# Patient Record
Sex: Female | Born: 1935
Health system: Southern US, Community
[De-identification: ages and names within clinical notes are randomized; demographics above are authoritative.]

## PROBLEM LIST (undated history)

## (undated) DIAGNOSIS — I10 Essential (primary) hypertension: Secondary | ICD-10-CM

## (undated) DIAGNOSIS — T8859XA Other complications of anesthesia, initial encounter: Secondary | ICD-10-CM

## (undated) DIAGNOSIS — T4145XA Adverse effect of unspecified anesthetic, initial encounter: Secondary | ICD-10-CM

---
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
## 1997-07-23 ENCOUNTER — Ambulatory Visit: Admission: RE | Admit: 1997-07-23 | Discharge: 1997-07-23 | Payer: Self-pay | Admitting: Internal Medicine

## 1999-08-21 ENCOUNTER — Encounter: Payer: Self-pay | Admitting: Internal Medicine

## 1999-08-21 ENCOUNTER — Encounter: Admission: RE | Admit: 1999-08-21 | Discharge: 1999-08-21 | Payer: Self-pay | Admitting: Internal Medicine

## 2000-07-16 ENCOUNTER — Encounter: Admission: RE | Admit: 2000-07-16 | Discharge: 2000-07-16 | Payer: Self-pay | Admitting: Internal Medicine

## 2000-07-16 ENCOUNTER — Encounter: Payer: Self-pay | Admitting: Internal Medicine

## 2000-08-22 ENCOUNTER — Encounter: Admission: RE | Admit: 2000-08-22 | Discharge: 2000-08-22 | Payer: Self-pay | Admitting: Internal Medicine

## 2000-08-22 ENCOUNTER — Encounter: Payer: Self-pay | Admitting: Internal Medicine

## 2001-08-25 ENCOUNTER — Encounter: Payer: Self-pay | Admitting: Internal Medicine

## 2001-08-25 ENCOUNTER — Encounter: Admission: RE | Admit: 2001-08-25 | Discharge: 2001-08-25 | Payer: Self-pay | Admitting: Internal Medicine

## 2002-08-18 ENCOUNTER — Encounter: Admission: RE | Admit: 2002-08-18 | Discharge: 2002-08-18 | Payer: Self-pay | Admitting: Internal Medicine

## 2002-08-18 ENCOUNTER — Encounter: Payer: Self-pay | Admitting: Internal Medicine

## 2002-09-10 ENCOUNTER — Ambulatory Visit (HOSPITAL_COMMUNITY): Admission: RE | Admit: 2002-09-10 | Discharge: 2002-09-10 | Payer: Self-pay | Admitting: Gastroenterology

## 2004-08-21 ENCOUNTER — Encounter: Admission: RE | Admit: 2004-08-21 | Discharge: 2004-08-21 | Payer: Self-pay | Admitting: Internal Medicine

## 2006-10-03 ENCOUNTER — Encounter: Admission: RE | Admit: 2006-10-03 | Discharge: 2006-10-03 | Payer: Self-pay | Admitting: Internal Medicine

## 2008-10-21 ENCOUNTER — Encounter: Admission: RE | Admit: 2008-10-21 | Discharge: 2008-10-21 | Payer: Self-pay | Admitting: Internal Medicine

## 2010-01-12 ENCOUNTER — Encounter: Admission: RE | Admit: 2010-01-12 | Discharge: 2010-01-12 | Payer: Self-pay | Admitting: Internal Medicine

## 2010-08-04 NOTE — Op Note (Signed)
   NAME:  Tammy Dillon, Tammy Dillon                          ACCOUNT NO.:  [id number]   MEDICAL RECORD NO.:  [id number]                   PATIENT TYPE:  AMB   LOCATION:  ENDO                                 FACILITY:  [facility]   PHYSICIAN:  Danise Edge, M.D.                DATE OF BIRTH:  1930-02-04   DATE OF PROCEDURE:  09/10/2002  DATE OF DISCHARGE:                                 OPERATIVE REPORT   PROCEDURE:  Screening colonoscopy.   INDICATIONS FOR PROCEDURE:  Ms. [name] is a 77 year old female born  August 22, 1929.  Ms. [name] is scheduled to undergo her first screening  colonoscopy with polypectomy to prevent colon cancer.   ENDOSCOPIST:  Charolett Bumpers, M.D.   PREMEDICATION:  Versed 7.5 mg, Demerol 30 mg.   PROCEDURE:  After obtaining confirmed consent, Ms. [name] was placed in the  left lateral decubitus position.  I administered intravenous Demerol and  intravenous  Versed to achieve conscious sedation for the procedure.  The  patient's blood pressure, oxygen saturation, and cardiac rhythm were  monitored throughout the procedure and documented in the medical record.  Anal inspection was normal.  Digital rectal exam was normal.  The Olympus  adjustable pediatric video colonoscope was introduced into the rectum and  advanced to the cecum.  Colonic preparation for the exam today was  excellent.   Rectum normal.  Sigmoid colon and descending colon:  Left colonic diverticulosis.  Splenic flexure normal.  Transverse colon normal.  Hepatic flexure normal.  Ascending colon normal.  Cecum and ileocecal valve normal.   ASSESSMENT:  Normal screening proctocolonoscopy to the cecum.  No endoscopic  evidence for the presence of colorectal neoplasia.  There was colonic  diverticulosis without diverticulitis or diverticular stricture formation  noted.                                               Danise Edge, M.D.    MJ/MEDQ  D:  09/10/2002  T:  09/11/2002  Job:   [id number]

## 2011-01-03 ENCOUNTER — Other Ambulatory Visit: Payer: Self-pay | Admitting: Internal Medicine

## 2011-01-03 DIAGNOSIS — Z1231 Encounter for screening mammogram for malignant neoplasm of breast: Secondary | ICD-10-CM

## 2011-01-25 ENCOUNTER — Ambulatory Visit
Admission: RE | Admit: 2011-01-25 | Discharge: 2011-01-25 | Disposition: A | Discharge status: Home / Self Care | Payer: Medicare Other | Source: Ambulatory Visit | Attending: Internal Medicine | Admitting: Internal Medicine

## 2011-01-25 DIAGNOSIS — Z1231 Encounter for screening mammogram for malignant neoplasm of breast: Secondary | ICD-10-CM

## 2011-07-02 DIAGNOSIS — Z961 Presence of intraocular lens: Secondary | ICD-10-CM | POA: Diagnosis not present

## 2011-07-02 DIAGNOSIS — H2510 Age-related nuclear cataract, unspecified eye: Secondary | ICD-10-CM | POA: Diagnosis not present

## 2011-11-15 DIAGNOSIS — E782 Mixed hyperlipidemia: Secondary | ICD-10-CM | POA: Diagnosis not present

## 2011-11-15 DIAGNOSIS — E559 Vitamin D deficiency, unspecified: Secondary | ICD-10-CM | POA: Diagnosis not present

## 2011-11-15 DIAGNOSIS — I10 Essential (primary) hypertension: Secondary | ICD-10-CM | POA: Diagnosis not present

## 2011-11-15 DIAGNOSIS — G4762 Sleep related leg cramps: Secondary | ICD-10-CM | POA: Diagnosis not present

## 2011-11-15 DIAGNOSIS — Z79899 Other long term (current) drug therapy: Secondary | ICD-10-CM | POA: Diagnosis not present

## 2011-11-15 DIAGNOSIS — Z1331 Encounter for screening for depression: Secondary | ICD-10-CM | POA: Diagnosis not present

## 2011-11-15 DIAGNOSIS — N390 Urinary tract infection, site not specified: Secondary | ICD-10-CM | POA: Diagnosis not present

## 2011-11-15 DIAGNOSIS — M810 Age-related osteoporosis without current pathological fracture: Secondary | ICD-10-CM | POA: Diagnosis not present

## 2011-11-15 DIAGNOSIS — R5382 Chronic fatigue, unspecified: Secondary | ICD-10-CM | POA: Diagnosis not present

## 2011-11-15 DIAGNOSIS — Z0000 Encounter for general adult medical examination without abnormal findings: Secondary | ICD-10-CM | POA: Diagnosis not present

## 2011-11-20 DIAGNOSIS — L821 Other seborrheic keratosis: Secondary | ICD-10-CM | POA: Diagnosis not present

## 2011-11-20 DIAGNOSIS — L570 Actinic keratosis: Secondary | ICD-10-CM | POA: Diagnosis not present

## 2011-11-20 DIAGNOSIS — Z85828 Personal history of other malignant neoplasm of skin: Secondary | ICD-10-CM | POA: Diagnosis not present

## 2011-12-04 DIAGNOSIS — N390 Urinary tract infection, site not specified: Secondary | ICD-10-CM | POA: Diagnosis not present

## 2011-12-13 DIAGNOSIS — Z23 Encounter for immunization: Secondary | ICD-10-CM | POA: Diagnosis not present

## 2012-01-22 ENCOUNTER — Other Ambulatory Visit: Payer: Self-pay | Admitting: Internal Medicine

## 2012-01-22 DIAGNOSIS — E7800 Pure hypercholesterolemia, unspecified: Secondary | ICD-10-CM | POA: Diagnosis not present

## 2012-01-22 DIAGNOSIS — Z1231 Encounter for screening mammogram for malignant neoplasm of breast: Secondary | ICD-10-CM

## 2012-02-28 ENCOUNTER — Ambulatory Visit
Admission: RE | Admit: 2012-02-28 | Discharge: 2012-02-28 | Disposition: A | Discharge status: Home / Self Care | Payer: Medicare Other | Source: Ambulatory Visit | Attending: Internal Medicine | Admitting: Internal Medicine

## 2012-02-28 DIAGNOSIS — Z1231 Encounter for screening mammogram for malignant neoplasm of breast: Secondary | ICD-10-CM | POA: Diagnosis not present

## 2012-09-11 DIAGNOSIS — L719 Rosacea, unspecified: Secondary | ICD-10-CM | POA: Diagnosis not present

## 2012-09-11 DIAGNOSIS — D485 Neoplasm of uncertain behavior of skin: Secondary | ICD-10-CM | POA: Diagnosis not present

## 2012-09-11 DIAGNOSIS — D0439 Carcinoma in situ of skin of other parts of face: Secondary | ICD-10-CM | POA: Diagnosis not present

## 2012-09-11 DIAGNOSIS — L570 Actinic keratosis: Secondary | ICD-10-CM | POA: Diagnosis not present

## 2012-09-11 DIAGNOSIS — Z85828 Personal history of other malignant neoplasm of skin: Secondary | ICD-10-CM | POA: Diagnosis not present

## 2012-10-08 DIAGNOSIS — Z85828 Personal history of other malignant neoplasm of skin: Secondary | ICD-10-CM | POA: Diagnosis not present

## 2012-10-08 DIAGNOSIS — D0439 Carcinoma in situ of skin of other parts of face: Secondary | ICD-10-CM | POA: Diagnosis not present

## 2012-10-30 DIAGNOSIS — Z961 Presence of intraocular lens: Secondary | ICD-10-CM | POA: Diagnosis not present

## 2012-10-30 DIAGNOSIS — H2510 Age-related nuclear cataract, unspecified eye: Secondary | ICD-10-CM | POA: Diagnosis not present

## 2012-11-18 DIAGNOSIS — E782 Mixed hyperlipidemia: Secondary | ICD-10-CM | POA: Diagnosis not present

## 2012-11-18 DIAGNOSIS — G4762 Sleep related leg cramps: Secondary | ICD-10-CM | POA: Diagnosis not present

## 2012-11-18 DIAGNOSIS — I10 Essential (primary) hypertension: Secondary | ICD-10-CM | POA: Diagnosis not present

## 2012-11-18 DIAGNOSIS — E559 Vitamin D deficiency, unspecified: Secondary | ICD-10-CM | POA: Diagnosis not present

## 2012-11-18 DIAGNOSIS — R350 Frequency of micturition: Secondary | ICD-10-CM | POA: Diagnosis not present

## 2012-11-18 DIAGNOSIS — Z1331 Encounter for screening for depression: Secondary | ICD-10-CM | POA: Diagnosis not present

## 2012-11-18 DIAGNOSIS — Z79899 Other long term (current) drug therapy: Secondary | ICD-10-CM | POA: Diagnosis not present

## 2012-11-18 DIAGNOSIS — Z0000 Encounter for general adult medical examination without abnormal findings: Secondary | ICD-10-CM | POA: Diagnosis not present

## 2012-11-18 DIAGNOSIS — M810 Age-related osteoporosis without current pathological fracture: Secondary | ICD-10-CM | POA: Diagnosis not present

## 2012-11-21 DIAGNOSIS — Z1211 Encounter for screening for malignant neoplasm of colon: Secondary | ICD-10-CM | POA: Diagnosis not present

## 2012-12-04 DIAGNOSIS — L719 Rosacea, unspecified: Secondary | ICD-10-CM | POA: Diagnosis not present

## 2012-12-04 DIAGNOSIS — L570 Actinic keratosis: Secondary | ICD-10-CM | POA: Diagnosis not present

## 2012-12-04 DIAGNOSIS — D239 Other benign neoplasm of skin, unspecified: Secondary | ICD-10-CM | POA: Diagnosis not present

## 2012-12-04 DIAGNOSIS — L821 Other seborrheic keratosis: Secondary | ICD-10-CM | POA: Diagnosis not present

## 2012-12-04 DIAGNOSIS — Z85828 Personal history of other malignant neoplasm of skin: Secondary | ICD-10-CM | POA: Diagnosis not present

## 2013-05-20 DIAGNOSIS — Z79899 Other long term (current) drug therapy: Secondary | ICD-10-CM | POA: Diagnosis not present

## 2013-05-20 DIAGNOSIS — I10 Essential (primary) hypertension: Secondary | ICD-10-CM | POA: Diagnosis not present

## 2013-05-20 DIAGNOSIS — R350 Frequency of micturition: Secondary | ICD-10-CM | POA: Diagnosis not present

## 2013-05-20 DIAGNOSIS — R5382 Chronic fatigue, unspecified: Secondary | ICD-10-CM | POA: Diagnosis not present

## 2013-05-20 DIAGNOSIS — E782 Mixed hyperlipidemia: Secondary | ICD-10-CM | POA: Diagnosis not present

## 2013-05-20 DIAGNOSIS — E559 Vitamin D deficiency, unspecified: Secondary | ICD-10-CM | POA: Diagnosis not present

## 2013-05-20 DIAGNOSIS — G4762 Sleep related leg cramps: Secondary | ICD-10-CM | POA: Diagnosis not present

## 2013-05-20 DIAGNOSIS — G9332 Myalgic encephalomyelitis/chronic fatigue syndrome: Secondary | ICD-10-CM | POA: Diagnosis not present

## 2013-05-20 DIAGNOSIS — M810 Age-related osteoporosis without current pathological fracture: Secondary | ICD-10-CM | POA: Diagnosis not present

## 2013-05-28 DIAGNOSIS — Z85828 Personal history of other malignant neoplasm of skin: Secondary | ICD-10-CM | POA: Diagnosis not present

## 2013-05-28 DIAGNOSIS — D485 Neoplasm of uncertain behavior of skin: Secondary | ICD-10-CM | POA: Diagnosis not present

## 2013-07-02 ENCOUNTER — Other Ambulatory Visit: Payer: Self-pay | Admitting: Internal Medicine

## 2013-07-02 DIAGNOSIS — Z1231 Encounter for screening mammogram for malignant neoplasm of breast: Secondary | ICD-10-CM

## 2013-07-23 ENCOUNTER — Ambulatory Visit
Admission: RE | Admit: 2013-07-23 | Discharge: 2013-07-23 | Disposition: A | Discharge status: Home / Self Care | Payer: Medicare Other | Source: Ambulatory Visit | Attending: Internal Medicine | Admitting: Internal Medicine

## 2013-07-23 ENCOUNTER — Encounter (INDEPENDENT_AMBULATORY_CARE_PROVIDER_SITE_OTHER): Payer: Self-pay

## 2013-07-23 DIAGNOSIS — Z1231 Encounter for screening mammogram for malignant neoplasm of breast: Secondary | ICD-10-CM

## 2013-11-19 DIAGNOSIS — E559 Vitamin D deficiency, unspecified: Secondary | ICD-10-CM | POA: Diagnosis not present

## 2013-11-19 DIAGNOSIS — Z23 Encounter for immunization: Secondary | ICD-10-CM | POA: Diagnosis not present

## 2013-11-19 DIAGNOSIS — R5381 Other malaise: Secondary | ICD-10-CM | POA: Diagnosis not present

## 2013-11-19 DIAGNOSIS — I10 Essential (primary) hypertension: Secondary | ICD-10-CM | POA: Diagnosis not present

## 2013-11-19 DIAGNOSIS — R350 Frequency of micturition: Secondary | ICD-10-CM | POA: Diagnosis not present

## 2013-11-19 DIAGNOSIS — R5383 Other fatigue: Secondary | ICD-10-CM | POA: Diagnosis not present

## 2013-11-19 DIAGNOSIS — Z79899 Other long term (current) drug therapy: Secondary | ICD-10-CM | POA: Diagnosis not present

## 2013-11-19 DIAGNOSIS — E782 Mixed hyperlipidemia: Secondary | ICD-10-CM | POA: Diagnosis not present

## 2013-11-19 DIAGNOSIS — M810 Age-related osteoporosis without current pathological fracture: Secondary | ICD-10-CM | POA: Diagnosis not present

## 2013-11-19 DIAGNOSIS — Z0000 Encounter for general adult medical examination without abnormal findings: Secondary | ICD-10-CM | POA: Diagnosis not present

## 2013-11-19 DIAGNOSIS — Z1331 Encounter for screening for depression: Secondary | ICD-10-CM | POA: Diagnosis not present

## 2013-12-09 DIAGNOSIS — Z23 Encounter for immunization: Secondary | ICD-10-CM | POA: Diagnosis not present

## 2013-12-10 DIAGNOSIS — L821 Other seborrheic keratosis: Secondary | ICD-10-CM | POA: Diagnosis not present

## 2013-12-10 DIAGNOSIS — L570 Actinic keratosis: Secondary | ICD-10-CM | POA: Diagnosis not present

## 2013-12-10 DIAGNOSIS — D1801 Hemangioma of skin and subcutaneous tissue: Secondary | ICD-10-CM | POA: Diagnosis not present

## 2013-12-10 DIAGNOSIS — Z85828 Personal history of other malignant neoplasm of skin: Secondary | ICD-10-CM | POA: Diagnosis not present

## 2014-02-04 DIAGNOSIS — H2512 Age-related nuclear cataract, left eye: Secondary | ICD-10-CM | POA: Diagnosis not present

## 2014-02-04 DIAGNOSIS — Z961 Presence of intraocular lens: Secondary | ICD-10-CM | POA: Diagnosis not present

## 2014-02-10 DIAGNOSIS — H2512 Age-related nuclear cataract, left eye: Secondary | ICD-10-CM | POA: Diagnosis not present

## 2014-02-16 DIAGNOSIS — Z85828 Personal history of other malignant neoplasm of skin: Secondary | ICD-10-CM | POA: Diagnosis not present

## 2014-02-16 DIAGNOSIS — L718 Other rosacea: Secondary | ICD-10-CM | POA: Diagnosis not present

## 2014-02-17 DIAGNOSIS — H2512 Age-related nuclear cataract, left eye: Secondary | ICD-10-CM | POA: Diagnosis not present

## 2014-05-19 DIAGNOSIS — Z79899 Other long term (current) drug therapy: Secondary | ICD-10-CM | POA: Diagnosis not present

## 2014-05-19 DIAGNOSIS — R300 Dysuria: Secondary | ICD-10-CM | POA: Diagnosis not present

## 2014-05-19 DIAGNOSIS — R7309 Other abnormal glucose: Secondary | ICD-10-CM | POA: Diagnosis not present

## 2014-05-19 DIAGNOSIS — J3489 Other specified disorders of nose and nasal sinuses: Secondary | ICD-10-CM | POA: Diagnosis not present

## 2014-05-19 DIAGNOSIS — I10 Essential (primary) hypertension: Secondary | ICD-10-CM | POA: Diagnosis not present

## 2014-05-19 DIAGNOSIS — E780 Pure hypercholesterolemia: Secondary | ICD-10-CM | POA: Diagnosis not present

## 2014-05-19 DIAGNOSIS — E559 Vitamin D deficiency, unspecified: Secondary | ICD-10-CM | POA: Diagnosis not present

## 2014-06-21 DIAGNOSIS — Z85828 Personal history of other malignant neoplasm of skin: Secondary | ICD-10-CM | POA: Diagnosis not present

## 2014-06-21 DIAGNOSIS — D1801 Hemangioma of skin and subcutaneous tissue: Secondary | ICD-10-CM | POA: Diagnosis not present

## 2014-06-21 DIAGNOSIS — D225 Melanocytic nevi of trunk: Secondary | ICD-10-CM | POA: Diagnosis not present

## 2014-06-21 DIAGNOSIS — L570 Actinic keratosis: Secondary | ICD-10-CM | POA: Diagnosis not present

## 2014-06-21 DIAGNOSIS — L821 Other seborrheic keratosis: Secondary | ICD-10-CM | POA: Diagnosis not present

## 2014-06-24 DIAGNOSIS — Z961 Presence of intraocular lens: Secondary | ICD-10-CM | POA: Diagnosis not present

## 2014-12-16 ENCOUNTER — Other Ambulatory Visit: Payer: Self-pay | Admitting: Internal Medicine

## 2014-12-16 DIAGNOSIS — Z23 Encounter for immunization: Secondary | ICD-10-CM | POA: Diagnosis not present

## 2014-12-16 DIAGNOSIS — R252 Cramp and spasm: Secondary | ICD-10-CM | POA: Diagnosis not present

## 2014-12-16 DIAGNOSIS — E559 Vitamin D deficiency, unspecified: Secondary | ICD-10-CM | POA: Diagnosis not present

## 2014-12-16 DIAGNOSIS — R0989 Other specified symptoms and signs involving the circulatory and respiratory systems: Secondary | ICD-10-CM

## 2014-12-16 DIAGNOSIS — I10 Essential (primary) hypertension: Secondary | ICD-10-CM | POA: Diagnosis not present

## 2014-12-16 DIAGNOSIS — J309 Allergic rhinitis, unspecified: Secondary | ICD-10-CM | POA: Diagnosis not present

## 2014-12-16 DIAGNOSIS — R7309 Other abnormal glucose: Secondary | ICD-10-CM | POA: Diagnosis not present

## 2014-12-16 DIAGNOSIS — Z1389 Encounter for screening for other disorder: Secondary | ICD-10-CM | POA: Diagnosis not present

## 2014-12-16 DIAGNOSIS — Z0001 Encounter for general adult medical examination with abnormal findings: Secondary | ICD-10-CM | POA: Diagnosis not present

## 2014-12-16 DIAGNOSIS — M810 Age-related osteoporosis without current pathological fracture: Secondary | ICD-10-CM | POA: Diagnosis not present

## 2014-12-16 DIAGNOSIS — E782 Mixed hyperlipidemia: Secondary | ICD-10-CM | POA: Diagnosis not present

## 2014-12-16 DIAGNOSIS — Z79899 Other long term (current) drug therapy: Secondary | ICD-10-CM | POA: Diagnosis not present

## 2014-12-20 ENCOUNTER — Other Ambulatory Visit: Payer: No Typology Code available for payment source

## 2014-12-20 ENCOUNTER — Ambulatory Visit
Admission: RE | Admit: 2014-12-20 | Discharge: 2014-12-20 | Disposition: A | Discharge status: Home / Self Care | Payer: Medicare Other | Source: Ambulatory Visit | Attending: Internal Medicine | Admitting: Internal Medicine

## 2014-12-20 DIAGNOSIS — I6523 Occlusion and stenosis of bilateral carotid arteries: Secondary | ICD-10-CM | POA: Diagnosis not present

## 2014-12-20 DIAGNOSIS — R0989 Other specified symptoms and signs involving the circulatory and respiratory systems: Secondary | ICD-10-CM

## 2015-06-21 DIAGNOSIS — D1801 Hemangioma of skin and subcutaneous tissue: Secondary | ICD-10-CM | POA: Diagnosis not present

## 2015-06-21 DIAGNOSIS — Z85828 Personal history of other malignant neoplasm of skin: Secondary | ICD-10-CM | POA: Diagnosis not present

## 2015-06-21 DIAGNOSIS — L570 Actinic keratosis: Secondary | ICD-10-CM | POA: Diagnosis not present

## 2015-06-21 DIAGNOSIS — L821 Other seborrheic keratosis: Secondary | ICD-10-CM | POA: Diagnosis not present

## 2016-01-06 DIAGNOSIS — J309 Allergic rhinitis, unspecified: Secondary | ICD-10-CM | POA: Diagnosis not present

## 2016-01-06 DIAGNOSIS — R252 Cramp and spasm: Secondary | ICD-10-CM | POA: Diagnosis not present

## 2016-01-06 DIAGNOSIS — Z1389 Encounter for screening for other disorder: Secondary | ICD-10-CM | POA: Diagnosis not present

## 2016-01-06 DIAGNOSIS — E782 Mixed hyperlipidemia: Secondary | ICD-10-CM | POA: Diagnosis not present

## 2016-01-06 DIAGNOSIS — R0989 Other specified symptoms and signs involving the circulatory and respiratory systems: Secondary | ICD-10-CM | POA: Diagnosis not present

## 2016-01-06 DIAGNOSIS — E559 Vitamin D deficiency, unspecified: Secondary | ICD-10-CM | POA: Diagnosis not present

## 2016-01-06 DIAGNOSIS — J9801 Acute bronchospasm: Secondary | ICD-10-CM | POA: Diagnosis not present

## 2016-01-06 DIAGNOSIS — Z23 Encounter for immunization: Secondary | ICD-10-CM | POA: Diagnosis not present

## 2016-01-06 DIAGNOSIS — I10 Essential (primary) hypertension: Secondary | ICD-10-CM | POA: Diagnosis not present

## 2016-01-06 DIAGNOSIS — M810 Age-related osteoporosis without current pathological fracture: Secondary | ICD-10-CM | POA: Diagnosis not present

## 2016-01-06 DIAGNOSIS — Z0001 Encounter for general adult medical examination with abnormal findings: Secondary | ICD-10-CM | POA: Diagnosis not present

## 2016-01-06 DIAGNOSIS — Z79899 Other long term (current) drug therapy: Secondary | ICD-10-CM | POA: Diagnosis not present

## 2016-01-06 DIAGNOSIS — R7303 Prediabetes: Secondary | ICD-10-CM | POA: Diagnosis not present

## 2016-01-20 DIAGNOSIS — N390 Urinary tract infection, site not specified: Secondary | ICD-10-CM | POA: Diagnosis not present

## 2016-06-26 DIAGNOSIS — D692 Other nonthrombocytopenic purpura: Secondary | ICD-10-CM | POA: Diagnosis not present

## 2016-06-26 DIAGNOSIS — L570 Actinic keratosis: Secondary | ICD-10-CM | POA: Diagnosis not present

## 2016-06-26 DIAGNOSIS — L821 Other seborrheic keratosis: Secondary | ICD-10-CM | POA: Diagnosis not present

## 2016-06-26 DIAGNOSIS — D225 Melanocytic nevi of trunk: Secondary | ICD-10-CM | POA: Diagnosis not present

## 2016-06-26 DIAGNOSIS — Z85828 Personal history of other malignant neoplasm of skin: Secondary | ICD-10-CM | POA: Diagnosis not present

## 2016-09-13 DIAGNOSIS — H26492 Other secondary cataract, left eye: Secondary | ICD-10-CM | POA: Diagnosis not present

## 2016-09-13 DIAGNOSIS — Z961 Presence of intraocular lens: Secondary | ICD-10-CM | POA: Diagnosis not present

## 2016-09-16 IMAGING — US US CAROTID DUPLEX BILAT
1 series · 13 of 24 positions shown · non-contrast
Comparison: None.

CLINICAL DATA: Left carotid bruit.

EXAM:
BILATERAL CAROTID DUPLEX ULTRASOUND
TECHNIQUE: Gray scale imaging, color Doppler and duplex ultrasound were
performed of bilateral carotid and vertebral arteries in the neck.

[Series 1: us carotid duplex bilat · 13 of 27 slices shown]
[im 1/27]
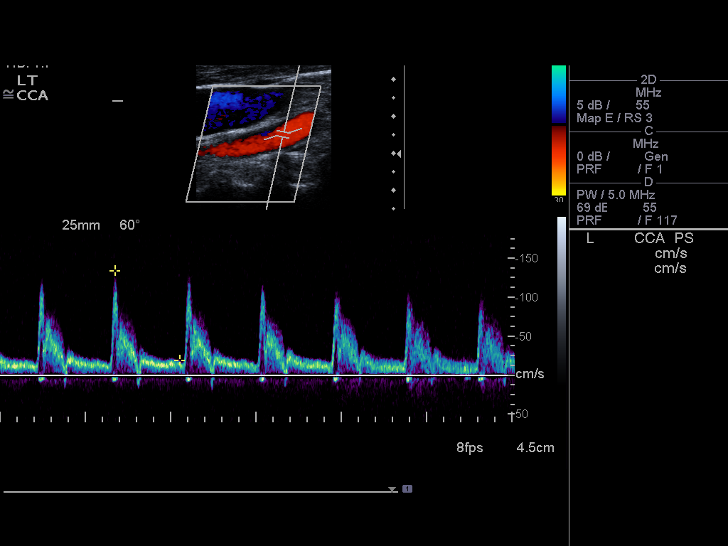
[im 3/27]
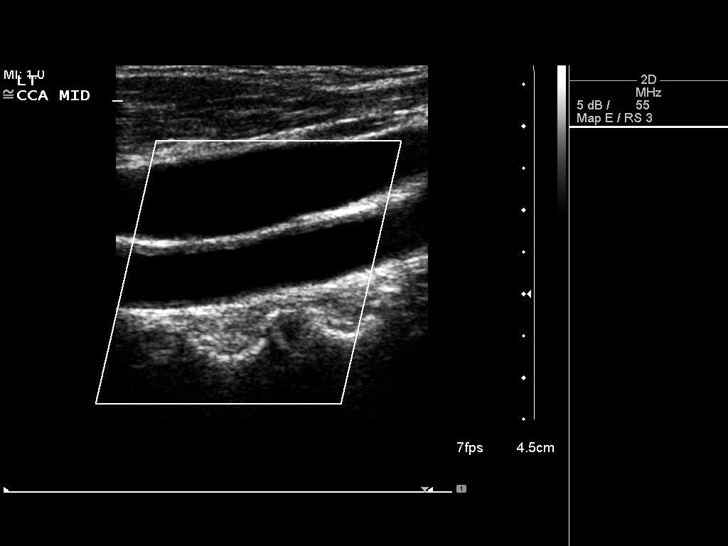
[im 5/27]
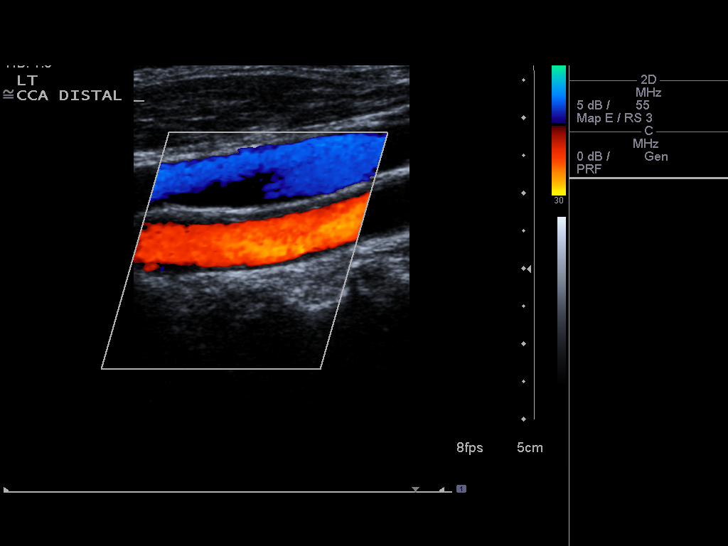
[im 7/27]
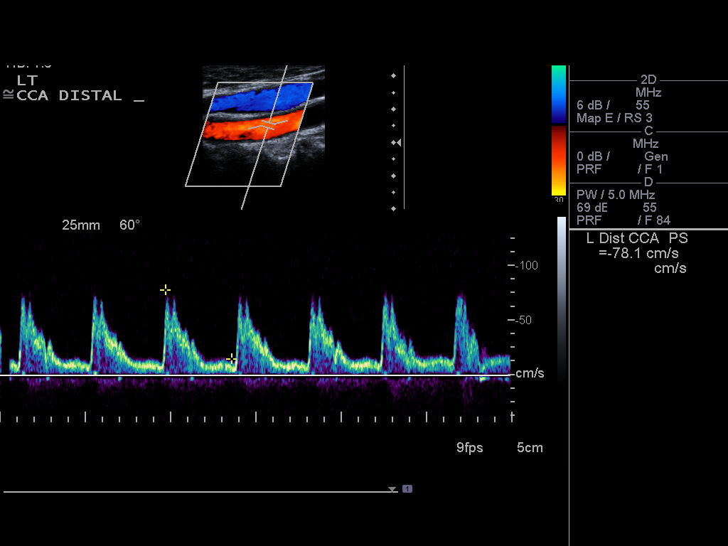
[im 10/27]
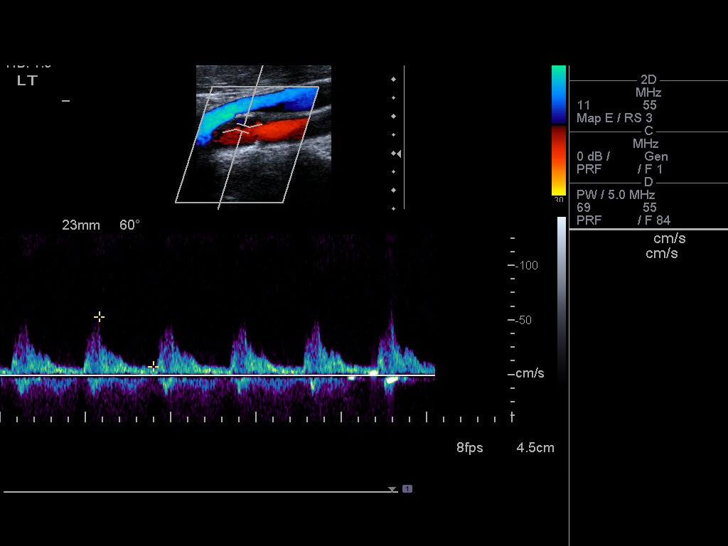
[im 12/27]
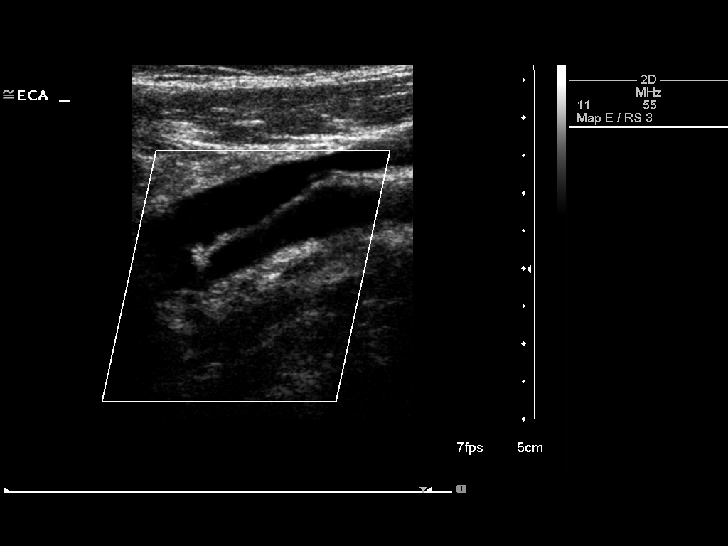
[im 14/27]
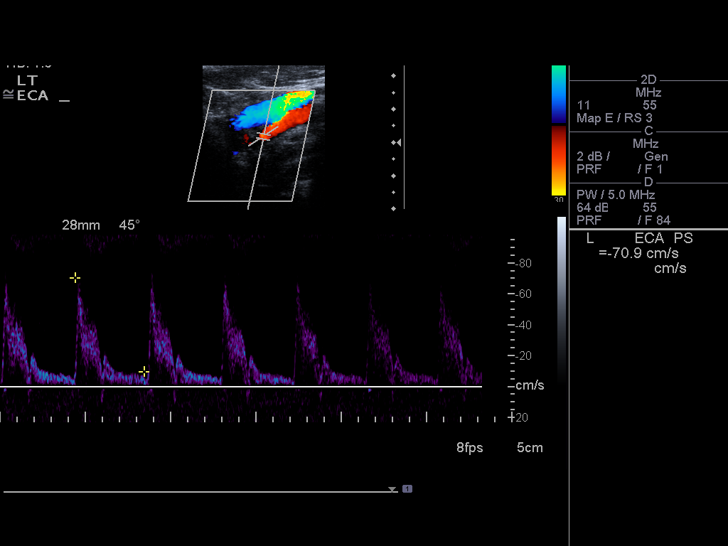
[im 15/27]
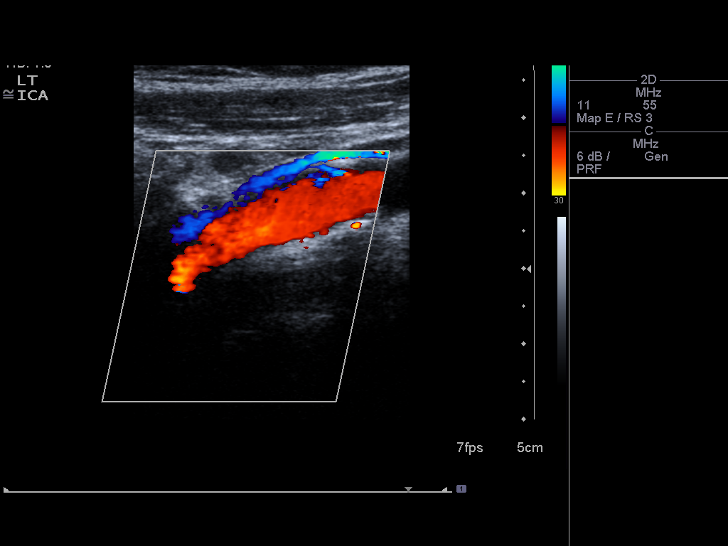
[im 17/27]
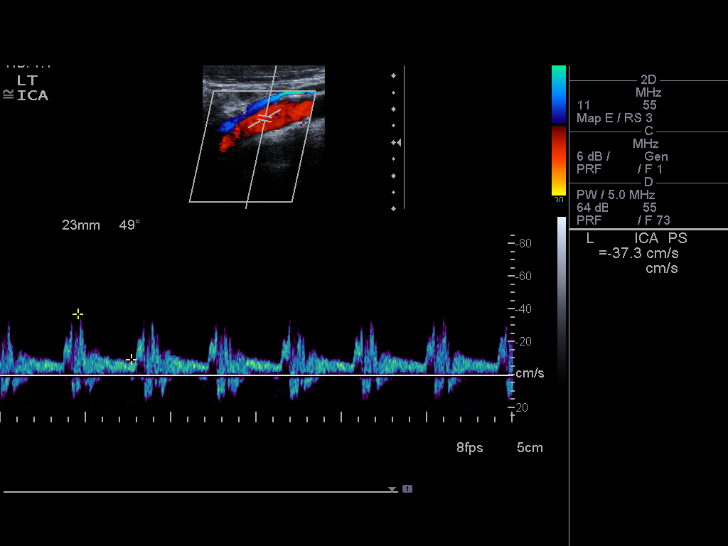
[im 20/27]
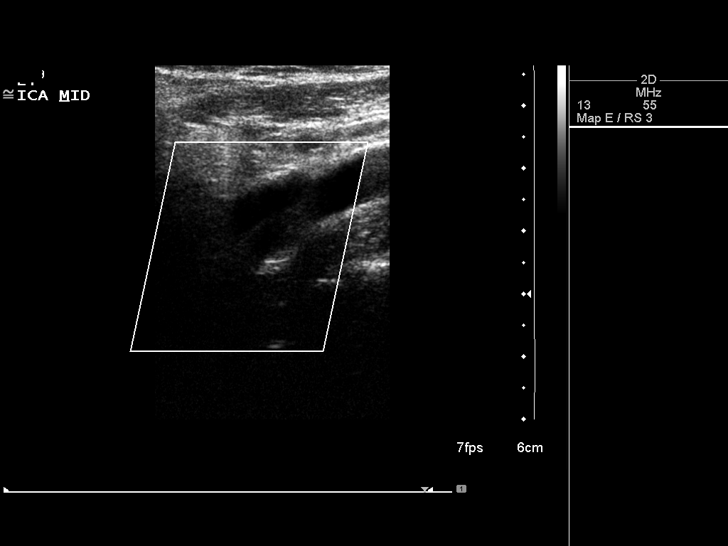
[im 22/27]
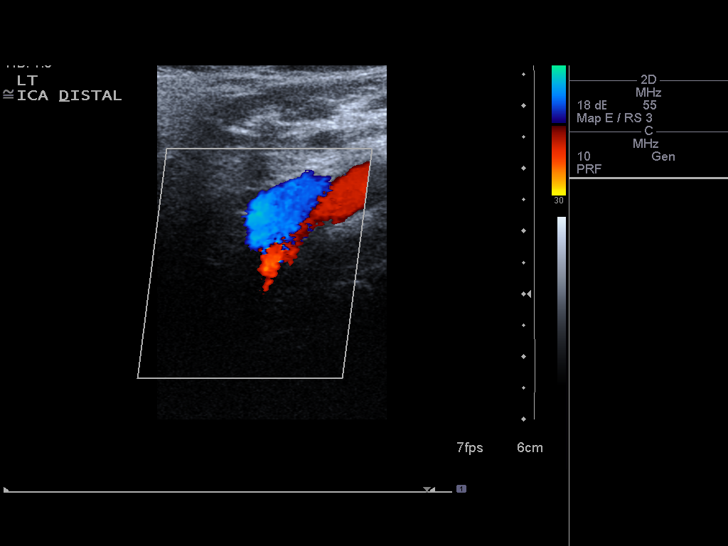
[im 24/27]
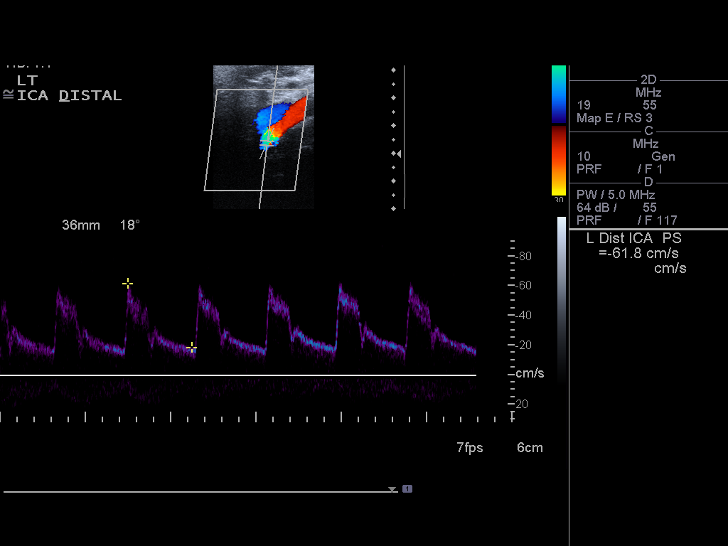
[im 27/27]
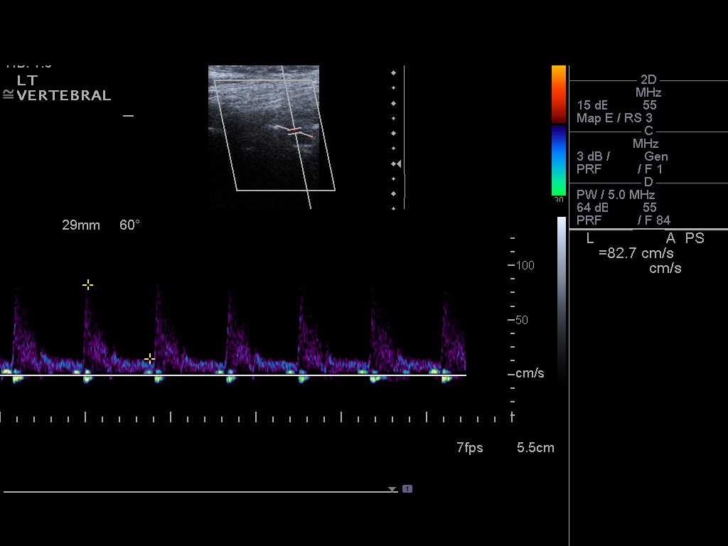

[13 of 24 positions shown; findings below may reference images not displayed]

FINDINGS: Criteria: Quantification of carotid stenosis is based on velocity
parameters that correlate the residual internal carotid diameter
with NASCET-based stenosis levels, using the diameter of the distal
internal carotid lumen as the denominator for stenosis measurement.

The following velocity measurements were obtained:

RIGHT

ICA:  74/25 cm/sec

CCA:  82/12 cm/sec

SYSTOLIC ICA/CCA RATIO:

DIASTOLIC ICA/CCA RATIO:

ECA:  76 cm/sec

LEFT

ICA:  67/23 cm/sec

CCA:  134/19 cm/sec

SYSTOLIC ICA/CCA RATIO:

DIASTOLIC ICA/CCA RATIO:

ECA:  71 cm/sec

RIGHT CAROTID ARTERY: Minimal calcified plaque formation is noted in
the right carotid bulb. No significant stenosis is noted in the
right internal carotid artery.

RIGHT VERTEBRAL ARTERY:  Antegrade flow is noted.

LEFT CAROTID ARTERY: Minimal calcified plaque formation is noted in
the left carotid bulb and proximal left internal carotid artery
consistent with less than 50% diameter stenosis based on ultrasound
and Doppler criteria.

LEFT VERTEBRAL ARTERY:  Antegrade flow is noted.
IMPRESSION: No hemodynamically significant stenosis or plaque is noted in either
cervical carotid artery.

## 2016-10-03 DIAGNOSIS — H26492 Other secondary cataract, left eye: Secondary | ICD-10-CM | POA: Diagnosis not present

## 2017-01-04 DIAGNOSIS — Z23 Encounter for immunization: Secondary | ICD-10-CM | POA: Diagnosis not present

## 2017-01-08 DIAGNOSIS — M810 Age-related osteoporosis without current pathological fracture: Secondary | ICD-10-CM | POA: Diagnosis not present

## 2017-01-08 DIAGNOSIS — Z1389 Encounter for screening for other disorder: Secondary | ICD-10-CM | POA: Diagnosis not present

## 2017-01-08 DIAGNOSIS — R252 Cramp and spasm: Secondary | ICD-10-CM | POA: Diagnosis not present

## 2017-01-08 DIAGNOSIS — I10 Essential (primary) hypertension: Secondary | ICD-10-CM | POA: Diagnosis not present

## 2017-01-08 DIAGNOSIS — R7303 Prediabetes: Secondary | ICD-10-CM | POA: Diagnosis not present

## 2017-01-08 DIAGNOSIS — J309 Allergic rhinitis, unspecified: Secondary | ICD-10-CM | POA: Diagnosis not present

## 2017-01-08 DIAGNOSIS — Z0001 Encounter for general adult medical examination with abnormal findings: Secondary | ICD-10-CM | POA: Diagnosis not present

## 2017-01-08 DIAGNOSIS — R0989 Other specified symptoms and signs involving the circulatory and respiratory systems: Secondary | ICD-10-CM | POA: Diagnosis not present

## 2017-01-08 DIAGNOSIS — E559 Vitamin D deficiency, unspecified: Secondary | ICD-10-CM | POA: Diagnosis not present

## 2017-01-08 DIAGNOSIS — Z79899 Other long term (current) drug therapy: Secondary | ICD-10-CM | POA: Diagnosis not present

## 2017-01-08 DIAGNOSIS — E782 Mixed hyperlipidemia: Secondary | ICD-10-CM | POA: Diagnosis not present

## 2017-01-11 ENCOUNTER — Other Ambulatory Visit: Payer: Self-pay | Admitting: Family Medicine

## 2017-01-11 DIAGNOSIS — Z1231 Encounter for screening mammogram for malignant neoplasm of breast: Secondary | ICD-10-CM

## 2017-04-11 DIAGNOSIS — E782 Mixed hyperlipidemia: Secondary | ICD-10-CM | POA: Diagnosis not present

## 2017-04-11 DIAGNOSIS — N390 Urinary tract infection, site not specified: Secondary | ICD-10-CM | POA: Diagnosis not present

## 2017-04-11 DIAGNOSIS — I10 Essential (primary) hypertension: Secondary | ICD-10-CM | POA: Diagnosis not present

## 2017-06-26 DIAGNOSIS — Z85828 Personal history of other malignant neoplasm of skin: Secondary | ICD-10-CM | POA: Diagnosis not present

## 2017-06-26 DIAGNOSIS — L820 Inflamed seborrheic keratosis: Secondary | ICD-10-CM | POA: Diagnosis not present

## 2017-06-26 DIAGNOSIS — L821 Other seborrheic keratosis: Secondary | ICD-10-CM | POA: Diagnosis not present

## 2017-06-26 DIAGNOSIS — D1801 Hemangioma of skin and subcutaneous tissue: Secondary | ICD-10-CM | POA: Diagnosis not present

## 2017-06-26 DIAGNOSIS — L570 Actinic keratosis: Secondary | ICD-10-CM | POA: Diagnosis not present

## 2017-07-11 DIAGNOSIS — R067 Sneezing: Secondary | ICD-10-CM | POA: Diagnosis not present

## 2017-07-11 DIAGNOSIS — H1013 Acute atopic conjunctivitis, bilateral: Secondary | ICD-10-CM | POA: Diagnosis not present

## 2017-07-11 DIAGNOSIS — E782 Mixed hyperlipidemia: Secondary | ICD-10-CM | POA: Diagnosis not present

## 2017-07-11 DIAGNOSIS — I10 Essential (primary) hypertension: Secondary | ICD-10-CM | POA: Diagnosis not present

## 2017-10-03 DIAGNOSIS — H5213 Myopia, bilateral: Secondary | ICD-10-CM | POA: Diagnosis not present

## 2017-10-03 DIAGNOSIS — H52203 Unspecified astigmatism, bilateral: Secondary | ICD-10-CM | POA: Diagnosis not present

## 2017-10-03 DIAGNOSIS — Z961 Presence of intraocular lens: Secondary | ICD-10-CM | POA: Diagnosis not present

## 2017-10-03 DIAGNOSIS — H524 Presbyopia: Secondary | ICD-10-CM | POA: Diagnosis not present

## 2017-12-03 ENCOUNTER — Ambulatory Visit
Admission: RE | Admit: 2017-12-03 | Discharge: 2017-12-03 | Disposition: A | Discharge status: Home / Self Care | Payer: Medicare Other | Source: Ambulatory Visit | Attending: Family Medicine | Admitting: Family Medicine

## 2017-12-03 DIAGNOSIS — Z1231 Encounter for screening mammogram for malignant neoplasm of breast: Secondary | ICD-10-CM | POA: Diagnosis not present

## 2017-12-03 DIAGNOSIS — Z23 Encounter for immunization: Secondary | ICD-10-CM | POA: Diagnosis not present

## 2018-01-13 DIAGNOSIS — E782 Mixed hyperlipidemia: Secondary | ICD-10-CM | POA: Diagnosis not present

## 2018-01-13 DIAGNOSIS — I10 Essential (primary) hypertension: Secondary | ICD-10-CM | POA: Diagnosis not present

## 2018-01-13 DIAGNOSIS — J309 Allergic rhinitis, unspecified: Secondary | ICD-10-CM | POA: Diagnosis not present

## 2018-01-13 DIAGNOSIS — Z1389 Encounter for screening for other disorder: Secondary | ICD-10-CM | POA: Diagnosis not present

## 2018-01-13 DIAGNOSIS — Z79899 Other long term (current) drug therapy: Secondary | ICD-10-CM | POA: Diagnosis not present

## 2018-01-13 DIAGNOSIS — E559 Vitamin D deficiency, unspecified: Secondary | ICD-10-CM | POA: Diagnosis not present

## 2018-01-13 DIAGNOSIS — Z0000 Encounter for general adult medical examination without abnormal findings: Secondary | ICD-10-CM | POA: Diagnosis not present

## 2018-01-13 DIAGNOSIS — M810 Age-related osteoporosis without current pathological fracture: Secondary | ICD-10-CM | POA: Diagnosis not present

## 2018-01-13 DIAGNOSIS — R7309 Other abnormal glucose: Secondary | ICD-10-CM | POA: Diagnosis not present

## 2018-05-21 DIAGNOSIS — Z85828 Personal history of other malignant neoplasm of skin: Secondary | ICD-10-CM | POA: Diagnosis not present

## 2018-05-21 DIAGNOSIS — C44329 Squamous cell carcinoma of skin of other parts of face: Secondary | ICD-10-CM | POA: Diagnosis not present

## 2018-05-21 DIAGNOSIS — C44319 Basal cell carcinoma of skin of other parts of face: Secondary | ICD-10-CM | POA: Diagnosis not present

## 2018-05-21 DIAGNOSIS — D485 Neoplasm of uncertain behavior of skin: Secondary | ICD-10-CM | POA: Diagnosis not present

## 2018-06-03 DIAGNOSIS — Z85828 Personal history of other malignant neoplasm of skin: Secondary | ICD-10-CM | POA: Diagnosis not present

## 2018-06-03 DIAGNOSIS — C44329 Squamous cell carcinoma of skin of other parts of face: Secondary | ICD-10-CM | POA: Diagnosis not present

## 2018-06-03 DIAGNOSIS — L988 Other specified disorders of the skin and subcutaneous tissue: Secondary | ICD-10-CM | POA: Diagnosis not present

## 2018-07-08 DIAGNOSIS — L570 Actinic keratosis: Secondary | ICD-10-CM | POA: Diagnosis not present

## 2018-07-08 DIAGNOSIS — L309 Dermatitis, unspecified: Secondary | ICD-10-CM | POA: Diagnosis not present

## 2018-07-08 DIAGNOSIS — Z85828 Personal history of other malignant neoplasm of skin: Secondary | ICD-10-CM | POA: Diagnosis not present

## 2018-07-08 DIAGNOSIS — L821 Other seborrheic keratosis: Secondary | ICD-10-CM | POA: Diagnosis not present

## 2018-10-07 DIAGNOSIS — Z961 Presence of intraocular lens: Secondary | ICD-10-CM | POA: Diagnosis not present

## 2018-12-19 DIAGNOSIS — Z23 Encounter for immunization: Secondary | ICD-10-CM | POA: Diagnosis not present

## 2019-01-07 DIAGNOSIS — D225 Melanocytic nevi of trunk: Secondary | ICD-10-CM | POA: Diagnosis not present

## 2019-01-07 DIAGNOSIS — Z85828 Personal history of other malignant neoplasm of skin: Secondary | ICD-10-CM | POA: Diagnosis not present

## 2019-01-07 DIAGNOSIS — L820 Inflamed seborrheic keratosis: Secondary | ICD-10-CM | POA: Diagnosis not present

## 2019-01-07 DIAGNOSIS — L821 Other seborrheic keratosis: Secondary | ICD-10-CM | POA: Diagnosis not present

## 2019-01-07 DIAGNOSIS — L570 Actinic keratosis: Secondary | ICD-10-CM | POA: Diagnosis not present

## 2019-02-03 DIAGNOSIS — R32 Unspecified urinary incontinence: Secondary | ICD-10-CM | POA: Diagnosis not present

## 2019-02-03 DIAGNOSIS — R109 Unspecified abdominal pain: Secondary | ICD-10-CM | POA: Diagnosis not present

## 2019-02-03 DIAGNOSIS — Z0001 Encounter for general adult medical examination with abnormal findings: Secondary | ICD-10-CM | POA: Diagnosis not present

## 2019-02-03 DIAGNOSIS — R7303 Prediabetes: Secondary | ICD-10-CM | POA: Diagnosis not present

## 2019-02-03 DIAGNOSIS — E559 Vitamin D deficiency, unspecified: Secondary | ICD-10-CM | POA: Diagnosis not present

## 2019-02-03 DIAGNOSIS — I10 Essential (primary) hypertension: Secondary | ICD-10-CM | POA: Diagnosis not present

## 2019-02-03 DIAGNOSIS — J309 Allergic rhinitis, unspecified: Secondary | ICD-10-CM | POA: Diagnosis not present

## 2019-02-03 DIAGNOSIS — Z79899 Other long term (current) drug therapy: Secondary | ICD-10-CM | POA: Diagnosis not present

## 2019-02-03 DIAGNOSIS — M810 Age-related osteoporosis without current pathological fracture: Secondary | ICD-10-CM | POA: Diagnosis not present

## 2019-02-03 DIAGNOSIS — Z1389 Encounter for screening for other disorder: Secondary | ICD-10-CM | POA: Diagnosis not present

## 2019-02-03 DIAGNOSIS — R634 Abnormal weight loss: Secondary | ICD-10-CM | POA: Diagnosis not present

## 2019-02-03 DIAGNOSIS — E782 Mixed hyperlipidemia: Secondary | ICD-10-CM | POA: Diagnosis not present

## 2019-02-03 DIAGNOSIS — R7309 Other abnormal glucose: Secondary | ICD-10-CM | POA: Diagnosis not present

## 2019-02-10 ENCOUNTER — Other Ambulatory Visit: Payer: Self-pay | Admitting: Internal Medicine

## 2019-02-10 DIAGNOSIS — R109 Unspecified abdominal pain: Secondary | ICD-10-CM

## 2019-02-11 ENCOUNTER — Ambulatory Visit
Admission: RE | Admit: 2019-02-11 | Discharge: 2019-02-11 | Disposition: A | Discharge status: Home / Self Care | Payer: Medicare Other | Source: Ambulatory Visit | Attending: Internal Medicine | Admitting: Internal Medicine

## 2019-02-11 ENCOUNTER — Other Ambulatory Visit: Payer: Self-pay

## 2019-02-11 DIAGNOSIS — K838 Other specified diseases of biliary tract: Secondary | ICD-10-CM | POA: Diagnosis not present

## 2019-02-11 DIAGNOSIS — K828 Other specified diseases of gallbladder: Secondary | ICD-10-CM | POA: Diagnosis not present

## 2019-02-11 DIAGNOSIS — R109 Unspecified abdominal pain: Secondary | ICD-10-CM

## 2019-02-11 DIAGNOSIS — K449 Diaphragmatic hernia without obstruction or gangrene: Secondary | ICD-10-CM | POA: Diagnosis not present

## 2019-02-11 MED ORDER — IOPAMIDOL (ISOVUE-300) INJECTION 61%
80.0000 mL | Freq: Once | INTRAVENOUS | Status: AC | PRN
  Administered 2019-02-11: 80 mL via INTRAVENOUS

## 2019-02-17 DIAGNOSIS — R932 Abnormal findings on diagnostic imaging of liver and biliary tract: Secondary | ICD-10-CM | POA: Diagnosis not present

## 2019-02-17 DIAGNOSIS — R634 Abnormal weight loss: Secondary | ICD-10-CM | POA: Diagnosis not present

## 2019-02-17 DIAGNOSIS — R1084 Generalized abdominal pain: Secondary | ICD-10-CM | POA: Diagnosis not present

## 2019-02-18 ENCOUNTER — Other Ambulatory Visit: Payer: Self-pay | Admitting: Gastroenterology

## 2019-02-18 DIAGNOSIS — R634 Abnormal weight loss: Secondary | ICD-10-CM

## 2019-02-18 DIAGNOSIS — R748 Abnormal levels of other serum enzymes: Secondary | ICD-10-CM

## 2019-02-18 DIAGNOSIS — R109 Unspecified abdominal pain: Secondary | ICD-10-CM

## 2019-02-21 ENCOUNTER — Other Ambulatory Visit: Payer: Self-pay

## 2019-02-21 ENCOUNTER — Ambulatory Visit (HOSPITAL_COMMUNITY)
Admission: RE | Admit: 2019-02-21 | Discharge: 2019-02-21 | Disposition: A | Discharge status: Home / Self Care | Payer: Medicare Other | Source: Ambulatory Visit | Attending: Gastroenterology | Admitting: Gastroenterology

## 2019-02-21 DIAGNOSIS — K8020 Calculus of gallbladder without cholecystitis without obstruction: Secondary | ICD-10-CM | POA: Diagnosis not present

## 2019-02-21 DIAGNOSIS — R634 Abnormal weight loss: Secondary | ICD-10-CM | POA: Diagnosis not present

## 2019-02-21 DIAGNOSIS — R748 Abnormal levels of other serum enzymes: Secondary | ICD-10-CM | POA: Diagnosis not present

## 2019-02-21 DIAGNOSIS — R935 Abnormal findings on diagnostic imaging of other abdominal regions, including retroperitoneum: Secondary | ICD-10-CM | POA: Diagnosis not present

## 2019-02-21 DIAGNOSIS — R109 Unspecified abdominal pain: Secondary | ICD-10-CM | POA: Diagnosis not present

## 2019-02-21 DIAGNOSIS — K8050 Calculus of bile duct without cholangitis or cholecystitis without obstruction: Secondary | ICD-10-CM | POA: Diagnosis not present

## 2019-02-21 DIAGNOSIS — K760 Fatty (change of) liver, not elsewhere classified: Secondary | ICD-10-CM | POA: Diagnosis not present

## 2019-02-21 MED ORDER — GADOBUTROL 1 MMOL/ML IV SOLN
5.0000 mL | Freq: Once | INTRAVENOUS | Status: AC | PRN
  Administered 2019-02-21: 13:00:00 5 mL via INTRAVENOUS

## 2019-02-23 ENCOUNTER — Other Ambulatory Visit: Payer: Self-pay | Admitting: Gastroenterology

## 2019-02-24 ENCOUNTER — Other Ambulatory Visit (HOSPITAL_COMMUNITY)
Admission: RE | Admit: 2019-02-24 | Discharge: 2019-02-24 | Disposition: A | Discharge status: Home / Self Care | Payer: Medicare Other | Source: Ambulatory Visit | Attending: Gastroenterology | Admitting: Gastroenterology

## 2019-02-24 DIAGNOSIS — Z20828 Contact with and (suspected) exposure to other viral communicable diseases: Secondary | ICD-10-CM | POA: Diagnosis not present

## 2019-02-24 DIAGNOSIS — Z01812 Encounter for preprocedural laboratory examination: Secondary | ICD-10-CM | POA: Insufficient documentation

## 2019-02-25 LAB — NOVEL CORONAVIRUS, NAA (HOSP ORDER, SEND-OUT TO REF LAB; TAT 18-24 HRS): SARS-CoV-2, NAA: NOT DETECTED

## 2019-02-26 ENCOUNTER — Encounter (HOSPITAL_COMMUNITY): Payer: Self-pay | Admitting: Gastroenterology

## 2019-02-27 ENCOUNTER — Ambulatory Visit (HOSPITAL_COMMUNITY)
Admission: RE | Admit: 2019-02-27 | Discharge: 2019-02-27 | Disposition: A | Discharge status: Home / Self Care | Payer: Medicare Other | Attending: Gastroenterology | Admitting: Gastroenterology

## 2019-02-27 ENCOUNTER — Other Ambulatory Visit: Payer: Self-pay

## 2019-02-27 ENCOUNTER — Ambulatory Visit (HOSPITAL_COMMUNITY): Payer: Medicare Other | Admitting: Anesthesiology

## 2019-02-27 ENCOUNTER — Ambulatory Visit (HOSPITAL_COMMUNITY): Payer: Medicare Other

## 2019-02-27 ENCOUNTER — Encounter (HOSPITAL_COMMUNITY)
Admission: RE | Disposition: A | Discharge status: Home / Self Care | Payer: Self-pay | Source: Home / Self Care | Attending: Gastroenterology

## 2019-02-27 ENCOUNTER — Encounter (HOSPITAL_COMMUNITY): Payer: Self-pay | Admitting: Gastroenterology

## 2019-02-27 DIAGNOSIS — Z79899 Other long term (current) drug therapy: Secondary | ICD-10-CM | POA: Diagnosis not present

## 2019-02-27 DIAGNOSIS — I10 Essential (primary) hypertension: Secondary | ICD-10-CM | POA: Insufficient documentation

## 2019-02-27 DIAGNOSIS — M810 Age-related osteoporosis without current pathological fracture: Secondary | ICD-10-CM | POA: Insufficient documentation

## 2019-02-27 DIAGNOSIS — E7800 Pure hypercholesterolemia, unspecified: Secondary | ICD-10-CM | POA: Diagnosis not present

## 2019-02-27 DIAGNOSIS — Z7982 Long term (current) use of aspirin: Secondary | ICD-10-CM | POA: Insufficient documentation

## 2019-02-27 DIAGNOSIS — Z800 Family history of malignant neoplasm of digestive organs: Secondary | ICD-10-CM | POA: Diagnosis not present

## 2019-02-27 DIAGNOSIS — K8050 Calculus of bile duct without cholangitis or cholecystitis without obstruction: Secondary | ICD-10-CM | POA: Diagnosis not present

## 2019-02-27 DIAGNOSIS — Z801 Family history of malignant neoplasm of trachea, bronchus and lung: Secondary | ICD-10-CM | POA: Insufficient documentation

## 2019-02-27 DIAGNOSIS — Z803 Family history of malignant neoplasm of breast: Secondary | ICD-10-CM | POA: Diagnosis not present

## 2019-02-27 DIAGNOSIS — Z79810 Long term (current) use of selective estrogen receptor modulators (SERMs): Secondary | ICD-10-CM | POA: Insufficient documentation

## 2019-02-27 HISTORY — DX: Other complications of anesthesia, initial encounter: T88.59XA

## 2019-02-27 HISTORY — PX: SPHINCTEROTOMY: SHX5544

## 2019-02-27 HISTORY — PX: ERCP: SHX5425

## 2019-02-27 HISTORY — PX: REMOVAL OF STONES: SHX5545

## 2019-02-27 SURGERY — ERCP, WITH INTERVENTION IF INDICATED
Anesthesia: General

## 2019-02-27 MED ORDER — PROPOFOL 10 MG/ML IV BOLUS
INTRAVENOUS | Status: AC
  Filled 2019-02-27: qty 20

## 2019-02-27 MED ORDER — SODIUM CHLORIDE 0.9 % IV SOLN
INTRAVENOUS | Status: DC | PRN
  Administered 2019-02-27: 30 mL

## 2019-02-27 MED ORDER — INDOMETHACIN 50 MG RE SUPP
RECTAL | Status: AC
  Filled 2019-02-27: qty 2

## 2019-02-27 MED ORDER — GLUCAGON HCL RDNA (DIAGNOSTIC) 1 MG IJ SOLR
INTRAMUSCULAR | Status: AC
  Filled 2019-02-27: qty 1

## 2019-02-27 MED ORDER — PROPOFOL 500 MG/50ML IV EMUL
INTRAVENOUS | Status: AC
  Filled 2019-02-27: qty 100

## 2019-02-27 MED ORDER — PROPOFOL 10 MG/ML IV BOLUS
INTRAVENOUS | Status: DC | PRN
  Administered 2019-02-27: 120 mg via INTRAVENOUS
  Administered 2019-02-27: 125 ug/kg/min via INTRAVENOUS

## 2019-02-27 MED ORDER — SODIUM CHLORIDE 0.9 % IV SOLN: INTRAVENOUS | Status: DC

## 2019-02-27 MED ORDER — FENTANYL CITRATE (PF) 100 MCG/2ML IJ SOLN
INTRAMUSCULAR | Status: DC | PRN
  Administered 2019-02-27 (×2): 25 ug via INTRAVENOUS
  Administered 2019-02-27: 50 ug via INTRAVENOUS

## 2019-02-27 MED ORDER — DEXAMETHASONE SODIUM PHOSPHATE 10 MG/ML IJ SOLN
INTRAMUSCULAR | Status: DC | PRN
  Administered 2019-02-27: 10 mg via INTRAVENOUS

## 2019-02-27 MED ORDER — ONDANSETRON HCL 4 MG/2ML IJ SOLN
INTRAMUSCULAR | Status: DC | PRN
  Administered 2019-02-27: 4 mg via INTRAVENOUS

## 2019-02-27 MED ORDER — ROCURONIUM BROMIDE 10 MG/ML (PF) SYRINGE
PREFILLED_SYRINGE | INTRAVENOUS | Status: DC | PRN
  Administered 2019-02-27: 45 mg via INTRAVENOUS

## 2019-02-27 MED ORDER — CIPROFLOXACIN IN D5W 400 MG/200ML IV SOLN
INTRAVENOUS | Status: AC
  Filled 2019-02-27: qty 200

## 2019-02-27 MED ORDER — LIDOCAINE 2% (20 MG/ML) 5 ML SYRINGE
INTRAMUSCULAR | Status: DC | PRN
  Administered 2019-02-27: 80 mg via INTRAVENOUS

## 2019-02-27 MED ORDER — LACTATED RINGERS IV SOLN
INTRAVENOUS | Status: DC | PRN
  Administered 2019-02-27: 11:00:00 via INTRAVENOUS

## 2019-02-27 MED ORDER — SUGAMMADEX SODIUM 200 MG/2ML IV SOLN
INTRAVENOUS | Status: DC | PRN
  Administered 2019-02-27: 200 mg via INTRAVENOUS

## 2019-02-27 MED ORDER — FENTANYL CITRATE (PF) 100 MCG/2ML IJ SOLN
INTRAMUSCULAR | Status: AC
  Filled 2019-02-27: qty 2

## 2019-02-27 MED ORDER — PROPOFOL 500 MG/50ML IV EMUL
INTRAVENOUS | Status: AC
  Filled 2019-02-27: qty 150

## 2019-02-27 MED ORDER — CIPROFLOXACIN IN D5W 400 MG/200ML IV SOLN
INTRAVENOUS | Status: DC | PRN
  Administered 2019-02-27: 400 mg via INTRAVENOUS

## 2019-02-27 NOTE — Anesthesia Procedure Notes (Signed)
Procedure Name: Intubation Date/Time: 02/27/2019 12:00 PM Performed by: Lavina Hamman, CRNA Pre-anesthesia Checklist: Patient identified, Emergency Drugs available, Suction available, Patient being monitored and Timeout performed Patient Re-evaluated:Patient Re-evaluated prior to induction Oxygen Delivery Method: Circle system utilized Preoxygenation: Pre-oxygenation with 100% oxygen Induction Type: IV induction Ventilation: Mask ventilation without difficulty Laryngoscope Size: Mac and 4 Grade View: Grade I Tube type: Oral Tube size: 7.5 mm Number of attempts: 1 Airway Equipment and Method: Stylet Placement Confirmation: ETT inserted through vocal cords under direct vision,  positive ETCO2,  CO2 detector and breath sounds checked- equal and bilateral Secured at: 22 cm Tube secured with: Tape Dental Injury: Teeth and Oropharynx as per pre-operative assessment

## 2019-02-27 NOTE — Discharge Instructions (Signed)
Call if question or problem otherwise follow-up in 1 month and begin today with liquids only until 6 PM and if doing well may have soft solids this evening and continue Prilosec over-the-counter since it seems to be helping and call specifically if increased abdominal pain nausea vomiting increased fever or signs of GI bleeding including black diarrhea  YOU HAD AN ENDOSCOPIC PROCEDURE TODAY: Refer to the procedure report and other information in the discharge instructions given to you for any specific questions about what was found during the examination. If this information does not answer your questions, please call [facility] office at [phone] to clarify.   YOU SHOULD EXPECT: Some feelings of bloating in the abdomen. Passage of more gas than usual. Walking can help get rid of the air that was put into your GI tract during the procedure and reduce the bloating. If you had a lower endoscopy (such as a colonoscopy or flexible sigmoidoscopy) you may notice spotting of blood in your stool or on the toilet paper. Some abdominal soreness may be present for a day or two, also.  DIET: Your first meal following the procedure should be a light meal and then it is ok to progress to your normal diet. A half-sandwich or bowl of soup is an example of a good first meal. Heavy or fried foods are harder to digest and may make you feel nauseous or bloated. Drink plenty of fluids but you should avoid alcoholic beverages for 24 hours. If you had a esophageal dilation, please see attached instructions for diet.   ACTIVITY: Your care partner should take you home directly after the procedure. You should plan to take it easy, moving slowly for the rest of the day. You can resume normal activity the day after the procedure however YOU SHOULD NOT DRIVE, use power tools, machinery or perform tasks that involve climbing or major physical exertion for 24 hours (because of the sedation medicines used during the test).   SYMPTOMS  TO REPORT IMMEDIATELY: A gastroenterologist can be reached at any hour. Please call [phone]  for any of the following symptoms:  . Following lower endoscopy (colonoscopy, flexible sigmoidoscopy) Excessive amounts of blood in the stool  Significant tenderness, worsening of abdominal pains  Swelling of the abdomen that is new, acute  Fever of 100 or higher  . Following upper endoscopy (EGD, EUS, ERCP, esophageal dilation) Vomiting of blood or coffee ground material  New, significant abdominal pain  New, significant chest pain or pain under the shoulder blades  Painful or persistently difficult swallowing  New shortness of breath  Black, tarry-looking or red, bloody stools  FOLLOW UP:  If any biopsies were taken you will be contacted by phone or by letter within the next 1-3 weeks. Call [phone]  if you have not heard about the biopsies in 3 weeks.  Please also call with any specific questions about appointments or follow up tests.

## 2019-02-27 NOTE — Progress Notes (Signed)
Tammy Dillon 11:07 AM  Subjective: Patient is doing much better on Prilosec and no new medical complaints since we recently saw her on video health  Objective: Vital signs stable afebrile no acute distress exam please see preassessment evaluation  Assessment: CBD stones  Plan: Okay to proceed with ERCP with anesthesia assistance  [address] [facility] [address]  office [phone] After 5PM or if no answer call [phone]

## 2019-02-27 NOTE — Anesthesia Preprocedure Evaluation (Addendum)
Anesthesia Evaluation  Patient identified by MRN, date of birth, ID band Patient awake    Reviewed: Allergy & Precautions, H&P , NPO status , Patient's Chart, lab work & pertinent test results  Airway Mallampati: II   Neck ROM: full    Dental   Pulmonary neg pulmonary ROS,    breath sounds clear to auscultation       Cardiovascular hypertension,  Rhythm:regular Rate:Normal  hypercholesterolemia   Neuro/Psych    GI/Hepatic CBD stones   Endo/Other    Renal/GU      Musculoskeletal   Abdominal   Peds  Hematology   Anesthesia Other Findings   Reproductive/Obstetrics                            Anesthesia Physical Anesthesia Plan  ASA: II  Anesthesia Plan: General   Post-op Pain Management:    Induction: Intravenous  PONV Risk Score and Plan: 3 and Ondansetron, Dexamethasone and Treatment may vary due to age or medical condition  Airway Management Planned: Oral ETT  Additional Equipment:   Intra-op Plan:   Post-operative Plan: Extubation in OR  Informed Consent: I have reviewed the patients History and Physical, chart, labs and discussed the procedure including the risks, benefits and alternatives for the proposed anesthesia with the patient or authorized representative who has indicated his/her understanding and acceptance.       Plan Discussed with: CRNA, Anesthesiologist and Surgeon  Anesthesia Plan Comments:         Anesthesia Quick Evaluation

## 2019-02-27 NOTE — Transfer of Care (Signed)
Immediate Anesthesia Transfer of Care Note  Patient: Tammy Dillon  Procedure(s) Performed: Procedure(s): ENDOSCOPIC RETROGRADE CHOLANGIOPANCREATOGRAPHY (ERCP) (N/A) SPHINCTEROTOMY REMOVAL OF STONES  Patient Location: PACU  Anesthesia Type:General  Level of Consciousness:  sedated, patient cooperative and responds to stimulation  Airway & Oxygen Therapy:Patient Spontanous Breathing and Patient connected to face mask oxgen  Post-op Assessment:  Report given to PACU RN and Post -op Vital signs reviewed and stable  Post vital signs:  Reviewed and stable  Last Vitals:  Vitals:   02/27/19 0948  BP: (!) 177/67  Pulse: 76  Resp: 14  Temp: 36.8 C  SpO2: 123XX123    Complications: No apparent anesthesia complications

## 2019-02-27 NOTE — Op Note (Signed)
[facility] Patient Name: Tammy Dillon Procedure Date: 02/27/2019 MRN: [id number] Attending MD: [name] , MD Date of Birth: 21-Nov-1929 CSN: [id number] Age: 85 Admit Type: Outpatient Procedure:                ERCP Indications:              Bile duct stone(s), For therapy of bile duct                            stone(s) positive MRCP Providers:                [name], MD, Cleda Daub, RN, Elmer Ramp. Tilden Dome,                            RN, Elspeth Cho Tech., Technician, Arnoldo Hooker,                            CRNA Referring MD:              Medicines:                General Anesthesia Complications:            No immediate complications. Estimated Blood Loss:     Estimated blood loss: none. Procedure:                Pre-Anesthesia Assessment:                           - Prior to the procedure, a History and Physical                            was performed, and patient medications and                            allergies were reviewed. The patient's tolerance of                            previous anesthesia was also reviewed. The risks                            and benefits of the procedure and the sedation                            options and risks were discussed with the patient.                            All questions were answered, and informed consent                            was obtained. Prior Anticoagulants: The patient has                            taken no previous anticoagulant or antiplatelet                            agents except for  aspirin. ASA Grade Assessment:                            III - A patient with severe systemic disease. After                            reviewing the risks and benefits, the patient was                            deemed in satisfactory condition to undergo the                            procedure.                           After obtaining informed consent, the scope was                            passed under  direct vision. Throughout the                            procedure, the patient's blood pressure, pulse, and                            oxygen saturations were monitored continuously. The                            [id number] Olympus duodenoscope was                            introduced through the mouth, and used to inject                            contrast into and used to cannulate the bile duct.                            The ERCP was accomplished without difficulty. The                            patient tolerated the procedure well. Scope In: Scope Out: Findings:      The major papilla was bulging. Deep selective cannulation was readily       obtained and the CBD was dilated and an obvious stone was confirmed in       the mid duct and there was no pancreatic injection or wire advancement       throughout the procedure and we proceeded with a biliary sphincterotomy       was made with a Hydratome sphincterotome using ERBE electrocautery.       There was no post-sphincterotomy bleeding. We proceeded until we had       adequate biliary drainage and could get the fully bowed sphincterotome       easily in and out of the duct and choledocholithiasis was found in a       dilated duct. The middle third of the main bile duct contained one  stone, which was medium-sized in diameter. To discover objects, the       biliary tree was swept with a 15 mm balloon starting at the bifurcation.       All stones were removed. Nothing was found. Multiple balloon       pull-through's using the 15 mm balloon was done and we proceeded with 2       occlusion cholangiograms without obvious residual filling defects       although it was difficult to get all the air out of the mid duct but the       balloon passed readily through the pain sphincterotomy and there was       adequate biliary drainage and we stopped the procedure at this point and       the patient tolerated the procedure  well Impression:               - The major papilla appeared to be bulging.                           - Choledocholithiasis was found. Complete removal                            was accomplished by biliary sphincterotomy and                            balloon extraction.                           - A biliary sphincterotomy was performed.                           - The biliary tree was swept and nothing was found                            at the end of the procedure. And there was no                            pancreatic duct injection or wire advancement                            throughout the procedure Moderate Sedation:      Not Applicable - Patient had care per Anesthesia. Recommendation:           - Clear liquid diet for 6 hours. If doing well may                            have soft solids                           - Continue present medications.                           - Return to GI clinic in 4 weeks.                           - Telephone GI clinic if symptomatic. Procedure Code(s):        ---  Professional ---                           [phone], Endoscopic retrograde                            cholangiopancreatography (ERCP); with removal of                            calculi/debris from biliary/pancreatic duct(s)                           43262, Endoscopic retrograde                            cholangiopancreatography (ERCP); with                            sphincterotomy/papillotomy Diagnosis Code(s):        --- Professional ---                           K80.50, Calculus of bile duct without cholangitis                            or cholecystitis without obstruction                           K83.8, Other specified diseases of biliary tract CPT copyright 2019 [employer]. All rights reserved. The codes documented in this report are preliminary and upon coder review may  be revised to meet current compliance requirements. [name], MD 02/27/2019 12:12:53  PM This report has been signed electronically. Number of Addenda: 0

## 2019-03-02 ENCOUNTER — Encounter: Payer: Self-pay | Admitting: *Deleted

## 2019-03-02 NOTE — Anesthesia Postprocedure Evaluation (Signed)
Anesthesia Post Note  Patient: LAURENCE PIRKEY  Procedure(s) Performed: ENDOSCOPIC RETROGRADE CHOLANGIOPANCREATOGRAPHY (ERCP) (N/A ) SPHINCTEROTOMY REMOVAL OF STONES     Patient location during evaluation: Endoscopy Anesthesia Type: General Level of consciousness: awake and alert Pain management: pain level controlled Vital Signs Assessment: post-procedure vital signs reviewed and stable Respiratory status: spontaneous breathing, nonlabored ventilation, respiratory function stable and patient connected to nasal cannula oxygen Cardiovascular status: blood pressure returned to baseline and stable Postop Assessment: no apparent nausea or vomiting Anesthetic complications: no    Last Vitals:  Vitals:   02/27/19 1250 02/27/19 1300  BP: (!) 132/53 (!) 133/55  Pulse: 64 62  Resp: 15 14  Temp:    SpO2: 98% 99%    Last Pain:  Vitals:   02/27/19 1300  TempSrc:   PainSc: 0-No pain                 Montoya Brandel S

## 2019-04-28 DIAGNOSIS — R932 Abnormal findings on diagnostic imaging of liver and biliary tract: Secondary | ICD-10-CM | POA: Diagnosis not present

## 2019-04-28 DIAGNOSIS — R634 Abnormal weight loss: Secondary | ICD-10-CM | POA: Diagnosis not present

## 2019-08-04 DIAGNOSIS — I10 Essential (primary) hypertension: Secondary | ICD-10-CM | POA: Diagnosis not present

## 2019-08-04 DIAGNOSIS — R109 Unspecified abdominal pain: Secondary | ICD-10-CM | POA: Diagnosis not present

## 2019-08-04 DIAGNOSIS — E782 Mixed hyperlipidemia: Secondary | ICD-10-CM | POA: Diagnosis not present

## 2019-08-04 DIAGNOSIS — J309 Allergic rhinitis, unspecified: Secondary | ICD-10-CM | POA: Diagnosis not present

## 2019-08-04 DIAGNOSIS — M810 Age-related osteoporosis without current pathological fracture: Secondary | ICD-10-CM | POA: Diagnosis not present

## 2019-08-04 DIAGNOSIS — R634 Abnormal weight loss: Secondary | ICD-10-CM | POA: Diagnosis not present

## 2019-08-31 ENCOUNTER — Other Ambulatory Visit: Payer: Self-pay | Admitting: Internal Medicine

## 2019-08-31 DIAGNOSIS — Z13820 Encounter for screening for osteoporosis: Secondary | ICD-10-CM

## 2019-10-08 DIAGNOSIS — Z961 Presence of intraocular lens: Secondary | ICD-10-CM | POA: Diagnosis not present

## 2019-11-07 ENCOUNTER — Inpatient Hospital Stay (HOSPITAL_COMMUNITY)
Admission: EM | Admit: 2019-11-07 | Discharge: 2019-11-10 | DRG: 243 | Disposition: A | Discharge status: Home / Self Care | Payer: Medicare Other | Attending: Cardiology | Admitting: Cardiology

## 2019-11-07 ENCOUNTER — Encounter (HOSPITAL_COMMUNITY): Payer: Self-pay

## 2019-11-07 DIAGNOSIS — I959 Hypotension, unspecified: Secondary | ICD-10-CM | POA: Diagnosis not present

## 2019-11-07 DIAGNOSIS — R946 Abnormal results of thyroid function studies: Secondary | ICD-10-CM | POA: Diagnosis not present

## 2019-11-07 DIAGNOSIS — I10 Essential (primary) hypertension: Secondary | ICD-10-CM | POA: Diagnosis present

## 2019-11-07 DIAGNOSIS — R001 Bradycardia, unspecified: Secondary | ICD-10-CM

## 2019-11-07 DIAGNOSIS — R531 Weakness: Secondary | ICD-10-CM | POA: Diagnosis not present

## 2019-11-07 DIAGNOSIS — I441 Atrioventricular block, second degree: Principal | ICD-10-CM

## 2019-11-07 DIAGNOSIS — W1830XA Fall on same level, unspecified, initial encounter: Secondary | ICD-10-CM | POA: Diagnosis present

## 2019-11-07 DIAGNOSIS — Z803 Family history of malignant neoplasm of breast: Secondary | ICD-10-CM

## 2019-11-07 DIAGNOSIS — W19XXXA Unspecified fall, initial encounter: Secondary | ICD-10-CM | POA: Diagnosis not present

## 2019-11-07 DIAGNOSIS — Z7982 Long term (current) use of aspirin: Secondary | ICD-10-CM

## 2019-11-07 DIAGNOSIS — N179 Acute kidney failure, unspecified: Secondary | ICD-10-CM | POA: Diagnosis not present

## 2019-11-07 DIAGNOSIS — Z885 Allergy status to narcotic agent status: Secondary | ICD-10-CM

## 2019-11-07 DIAGNOSIS — Z20822 Contact with and (suspected) exposure to covid-19: Secondary | ICD-10-CM | POA: Diagnosis present

## 2019-11-07 DIAGNOSIS — Z801 Family history of malignant neoplasm of trachea, bronchus and lung: Secondary | ICD-10-CM

## 2019-11-07 DIAGNOSIS — Z833 Family history of diabetes mellitus: Secondary | ICD-10-CM

## 2019-11-07 DIAGNOSIS — I358 Other nonrheumatic aortic valve disorders: Secondary | ICD-10-CM | POA: Diagnosis present

## 2019-11-07 DIAGNOSIS — Z79899 Other long term (current) drug therapy: Secondary | ICD-10-CM

## 2019-11-07 DIAGNOSIS — R55 Syncope and collapse: Secondary | ICD-10-CM

## 2019-11-07 DIAGNOSIS — Z95818 Presence of other cardiac implants and grafts: Secondary | ICD-10-CM

## 2019-11-07 HISTORY — DX: Essential (primary) hypertension: I10

## 2019-11-07 LAB — BASIC METABOLIC PANEL
Anion gap: 10 (ref 5–15)
BUN: 34 mg/dL — ABNORMAL HIGH (ref 8–23)
CO2: 20 mmol/L — ABNORMAL LOW (ref 22–32)
Calcium: 8.9 mg/dL (ref 8.9–10.3)
Chloride: 104 mmol/L (ref 98–111)
Creatinine, Ser: 1.24 mg/dL — ABNORMAL HIGH (ref 0.44–1.00)
GFR calc Af Amer: 45 mL/min — ABNORMAL LOW (ref >60)
GFR calc non Af Amer: 39 mL/min — ABNORMAL LOW (ref >60)
Glucose, Bld: 135 mg/dL — ABNORMAL HIGH (ref 70–99)
Potassium: 4.4 mmol/L (ref 3.5–5.1)
Sodium: 134 mmol/L — ABNORMAL LOW (ref 135–145)

## 2019-11-07 LAB — CBC
HCT: 31.8 % — ABNORMAL LOW (ref 36.0–46.0)
Hemoglobin: 10.3 g/dL — ABNORMAL LOW (ref 12.0–15.0)
MCH: 30.6 pg (ref 26.0–34.0)
MCHC: 32.4 g/dL (ref 30.0–36.0)
MCV: 94.4 fL (ref 80.0–100.0)
Platelets: 227 10*3/uL (ref 150–400)
RBC: 3.37 MIL/uL — ABNORMAL LOW (ref 3.87–5.11)
RDW: 13.2 % (ref 11.5–15.5)
WBC: 10.3 10*3/uL (ref 4.0–10.5)
nRBC: 0 % (ref 0.0–0.2)

## 2019-11-07 NOTE — ED Triage Notes (Signed)
To triage via EMS from home.  Pt was standing talking to family then fell backwards hitting floor.  Pt was groggy for several minutes.   Hematoma to back of head.  No c/o pain Nauseated- but pt reports she is always nauseated.  Got second covid shot yesterday.  EMS BP 138/62 HR 70 RR 16 SpO2 99%  CBG 120 Temp 98.4

## 2019-11-08 ENCOUNTER — Inpatient Hospital Stay (HOSPITAL_COMMUNITY): Payer: Medicare Other

## 2019-11-08 ENCOUNTER — Observation Stay (HOSPITAL_COMMUNITY): Payer: Medicare Other

## 2019-11-08 ENCOUNTER — Emergency Department (HOSPITAL_COMMUNITY): Payer: Medicare Other

## 2019-11-08 ENCOUNTER — Encounter (HOSPITAL_COMMUNITY): Payer: Self-pay | Admitting: Physician Assistant

## 2019-11-08 DIAGNOSIS — N179 Acute kidney failure, unspecified: Secondary | ICD-10-CM | POA: Diagnosis present

## 2019-11-08 DIAGNOSIS — R55 Syncope and collapse: Secondary | ICD-10-CM

## 2019-11-08 DIAGNOSIS — I10 Essential (primary) hypertension: Secondary | ICD-10-CM | POA: Diagnosis present

## 2019-11-08 DIAGNOSIS — G319 Degenerative disease of nervous system, unspecified: Secondary | ICD-10-CM | POA: Diagnosis not present

## 2019-11-08 DIAGNOSIS — R946 Abnormal results of thyroid function studies: Secondary | ICD-10-CM | POA: Diagnosis present

## 2019-11-08 DIAGNOSIS — I358 Other nonrheumatic aortic valve disorders: Secondary | ICD-10-CM | POA: Diagnosis present

## 2019-11-08 DIAGNOSIS — Z7982 Long term (current) use of aspirin: Secondary | ICD-10-CM | POA: Diagnosis not present

## 2019-11-08 DIAGNOSIS — Z803 Family history of malignant neoplasm of breast: Secondary | ICD-10-CM | POA: Diagnosis not present

## 2019-11-08 DIAGNOSIS — Z885 Allergy status to narcotic agent status: Secondary | ICD-10-CM | POA: Diagnosis not present

## 2019-11-08 DIAGNOSIS — R001 Bradycardia, unspecified: Secondary | ICD-10-CM | POA: Diagnosis present

## 2019-11-08 DIAGNOSIS — J90 Pleural effusion, not elsewhere classified: Secondary | ICD-10-CM | POA: Diagnosis not present

## 2019-11-08 DIAGNOSIS — I441 Atrioventricular block, second degree: Secondary | ICD-10-CM | POA: Diagnosis present

## 2019-11-08 DIAGNOSIS — S0990XA Unspecified injury of head, initial encounter: Secondary | ICD-10-CM | POA: Diagnosis not present

## 2019-11-08 DIAGNOSIS — W1830XA Fall on same level, unspecified, initial encounter: Secondary | ICD-10-CM | POA: Diagnosis present

## 2019-11-08 DIAGNOSIS — Z801 Family history of malignant neoplasm of trachea, bronchus and lung: Secondary | ICD-10-CM | POA: Diagnosis not present

## 2019-11-08 DIAGNOSIS — Z833 Family history of diabetes mellitus: Secondary | ICD-10-CM | POA: Diagnosis not present

## 2019-11-08 DIAGNOSIS — Z20822 Contact with and (suspected) exposure to covid-19: Secondary | ICD-10-CM | POA: Diagnosis present

## 2019-11-08 DIAGNOSIS — I6782 Cerebral ischemia: Secondary | ICD-10-CM | POA: Diagnosis not present

## 2019-11-08 DIAGNOSIS — Z79899 Other long term (current) drug therapy: Secondary | ICD-10-CM | POA: Diagnosis not present

## 2019-11-08 LAB — URINALYSIS, ROUTINE W REFLEX MICROSCOPIC
Bilirubin Urine: NEGATIVE
Glucose, UA: NEGATIVE mg/dL
Ketones, ur: NEGATIVE mg/dL
Nitrite: NEGATIVE
Protein, ur: NEGATIVE mg/dL
Specific Gravity, Urine: 1.015 (ref 1.005–1.030)
pH: 5 (ref 5.0–8.0)

## 2019-11-08 LAB — CREATININE, SERUM
Creatinine, Ser: 1.13 mg/dL — ABNORMAL HIGH (ref 0.44–1.00)
GFR calc Af Amer: 50 mL/min — ABNORMAL LOW (ref >60)
GFR calc non Af Amer: 43 mL/min — ABNORMAL LOW (ref >60)

## 2019-11-08 LAB — ECHOCARDIOGRAM COMPLETE
AR max vel: 1.7 cm2
AV Area VTI: 1.77 cm2
AV Area mean vel: 1.55 cm2
AV Mean grad: 4 mmHg
AV Peak grad: 6.7 mmHg
Ao pk vel: 1.29 m/s
Area-P 1/2: 3.31 cm2
S' Lateral: 2.5 cm

## 2019-11-08 LAB — CBC
HCT: 29.3 % — ABNORMAL LOW (ref 36.0–46.0)
Hemoglobin: 9.5 g/dL — ABNORMAL LOW (ref 12.0–15.0)
MCH: 30.4 pg (ref 26.0–34.0)
MCHC: 32.4 g/dL (ref 30.0–36.0)
MCV: 93.9 fL (ref 80.0–100.0)
Platelets: 211 10*3/uL (ref 150–400)
RBC: 3.12 MIL/uL — ABNORMAL LOW (ref 3.87–5.11)
RDW: 13.2 % (ref 11.5–15.5)
WBC: 9.8 10*3/uL (ref 4.0–10.5)
nRBC: 0 % (ref 0.0–0.2)

## 2019-11-08 LAB — TROPONIN I (HIGH SENSITIVITY)
Troponin I (High Sensitivity): 16 ng/L (ref <18)
Troponin I (High Sensitivity): 20 ng/L — ABNORMAL HIGH (ref <18)

## 2019-11-08 LAB — MAGNESIUM: Magnesium: 2.4 mg/dL (ref 1.7–2.4)

## 2019-11-08 LAB — SARS CORONAVIRUS 2 BY RT PCR (HOSPITAL ORDER, PERFORMED IN ~~LOC~~ HOSPITAL LAB): SARS Coronavirus 2: NEGATIVE

## 2019-11-08 MED ORDER — RALOXIFENE HCL 60 MG PO TABS
60.0000 mg | ORAL_TABLET | Freq: Every evening | ORAL | Status: DC
  Administered 2019-11-08: 60 mg via ORAL
  Filled 2019-11-08 (×2): qty 1

## 2019-11-08 MED ORDER — HYPROMELLOSE (GONIOSCOPIC) 2.5 % OP SOLN
1.0000 [drp] | Freq: Every day | OPHTHALMIC | Status: DC | PRN
  Filled 2019-11-08: qty 15

## 2019-11-08 MED ORDER — ROSUVASTATIN CALCIUM 5 MG PO TABS
5.0000 mg | ORAL_TABLET | ORAL | Status: DC
  Administered 2019-11-09: 5 mg via ORAL
  Filled 2019-11-08: qty 1

## 2019-11-08 MED ORDER — NITROGLYCERIN 0.4 MG SL SUBL: 0.4000 mg | SUBLINGUAL_TABLET | SUBLINGUAL | Status: DC | PRN

## 2019-11-08 MED ORDER — SODIUM CHLORIDE 0.9 % IV BOLUS
500.0000 mL | Freq: Once | INTRAVENOUS | Status: AC
  Administered 2019-11-08: 500 mL via INTRAVENOUS

## 2019-11-08 MED ORDER — LORATADINE 10 MG PO TABS
10.0000 mg | ORAL_TABLET | Freq: Every day | ORAL | Status: DC
  Administered 2019-11-08 – 2019-11-10 (×2): 10 mg via ORAL
  Filled 2019-11-08 (×3): qty 1

## 2019-11-08 MED ORDER — ZINC GLUCONATE 50 MG PO TABS: 50.0000 mg | ORAL_TABLET | Freq: Every evening | ORAL | Status: DC

## 2019-11-08 MED ORDER — ACETAMINOPHEN 325 MG PO TABS
650.0000 mg | ORAL_TABLET | ORAL | Status: DC | PRN
  Administered 2019-11-10: 650 mg via ORAL
  Filled 2019-11-08: qty 2

## 2019-11-08 MED ORDER — ASPIRIN EC 81 MG PO TBEC
81.0000 mg | DELAYED_RELEASE_TABLET | Freq: Every day | ORAL | Status: DC
  Administered 2019-11-09 – 2019-11-10 (×2): 81 mg via ORAL
  Filled 2019-11-08 (×2): qty 1

## 2019-11-08 MED ORDER — PANTOPRAZOLE SODIUM 40 MG PO TBEC
40.0000 mg | DELAYED_RELEASE_TABLET | Freq: Every day | ORAL | Status: DC
  Administered 2019-11-08 – 2019-11-10 (×3): 40 mg via ORAL
  Filled 2019-11-08 (×3): qty 1

## 2019-11-08 MED ORDER — HEPARIN SODIUM (PORCINE) 5000 UNIT/ML IJ SOLN
5000.0000 [IU] | Freq: Three times a day (TID) | INTRAMUSCULAR | Status: DC
  Administered 2019-11-08 – 2019-11-09 (×3): 5000 [IU] via SUBCUTANEOUS
  Filled 2019-11-08 (×3): qty 1

## 2019-11-08 MED ORDER — ONDANSETRON HCL 4 MG/2ML IJ SOLN
4.0000 mg | Freq: Four times a day (QID) | INTRAMUSCULAR | Status: DC | PRN
  Administered 2019-11-09: 4 mg via INTRAVENOUS

## 2019-11-08 MED ORDER — VITAMIN D3 25 MCG (1000 UNIT) PO TABS
2000.0000 [IU] | ORAL_TABLET | Freq: Every evening | ORAL | Status: DC
  Administered 2019-11-08: 2000 [IU] via ORAL
  Filled 2019-11-08 (×3): qty 2

## 2019-11-08 NOTE — ED Provider Notes (Signed)
Tammy Dillon Provider Note   CSN: [id number] Arrival date & time: 11/07/19  1447     History Chief Complaint  Patient presents with  . Loss of Consciousness    Tammy Dillon is a 86 y.o. female presents for evaluation after syncopal episode yesterday.  She reports that for the last 2 to 3 weeks she will occasionally feel lightheaded with ambulation as though she may fall backwards but "I am always able to steady myself".  She does not remember the episode yesterday but believes that she was ambulating and fell backwards.  Son at the bedside who did not witness the fall states that she struck the back of her head against a hardwood floor.  He states that he received word from his sister that the patient was unresponsive briefly but had her eyes open.  The patient regained consciousness while standing and states that family members helped pull her up to stand.  Denies chest pain, shortness of breath, vomiting, or diaphoresis.  She is chronically nauseated.  She walks outside 5 times a week and states that she has not been drinking as much water as she thinks she should.  She denies headache, vision changes, numbness or weakness of the extremities.  She is a non-smoker, denies recreational drug use or alcohol use.  She has no known cardiac history.  She takes a baby aspirin daily.  Received first dose of Pfizer vaccine around 3 weeks ago and the day before yesterday received second dose.  The history is provided by the patient and a relative.       Past Medical History:  Diagnosis Date  . Complication of anesthesia    Nausea/Vomiting  . Hypertension     Patient Active Problem List   Diagnosis Date Noted  . Syncope 11/08/2019    Past Surgical History:  Procedure Laterality Date  . ERCP N/A 02/27/2019   Procedure: ENDOSCOPIC RETROGRADE CHOLANGIOPANCREATOGRAPHY (ERCP);  Surgeon: Tammy Essex, MD;  Location: [facility];  Service: Endoscopy;   Laterality: N/A;  . REMOVAL OF STONES  02/27/2019   Procedure: REMOVAL OF STONES;  Surgeon: Tammy Essex, MD;  Location: [facility];  Service: Endoscopy;;  . Tammy Dillon  02/27/2019   Procedure: Tammy Dillon;  Surgeon: Tammy Essex, MD;  Location: [facility];  Service: Endoscopy;;     OB History   No obstetric history on file.     History reviewed. No pertinent family history.  Social History   Tobacco Use  . Smoking status: Not on file  Substance Use Topics  . Alcohol use: Not on file  . Drug use: Not on file    Home Medications Prior to Admission medications   Medication Sig Start Date End Date Taking? Authorizing Provider  aspirin 81 MG EC tablet Take 81 mg by mouth See admin instructions. Monday- Friday    [provider]  Cholecalciferol (VITAMIN D3) 50 MCG (2000 UT) TABS Take 2,000 Units by mouth every evening.    [provider]  desloratadine (CLARINEX) 5 MG tablet Take 5 mg by mouth every evening.  02/20/19   [provider]  hydroxypropyl methylcellulose / hypromellose (ISOPTO TEARS / GONIOVISC) 2.5 % ophthalmic solution Place 1 drop into both eyes daily as needed for dry eyes.    [provider]  metoprolol succinate (TOPROL-XL) 25 MG 24 hr tablet Take 25 mg by mouth every evening. 02/20/19   [provider]  omeprazole (PRILOSEC) 20 MG capsule Take 20 mg by mouth  daily.    [provider]  OVER THE COUNTER MEDICATION Take 7.5-10 mLs by mouth daily. Elementall Silver Colloidal    [provider]  raloxifene (EVISTA) 60 MG tablet Take 60 mg by mouth every evening. 02/20/19   [provider]  rosuvastatin (CRESTOR) 5 MG tablet Take 5 mg by mouth every Monday, Wednesday, and Friday. In the evening 02/20/19   [provider]  telmisartan-hydrochlorothiazide (MICARDIS HCT) 80-25 MG tablet Take 0.5 tablets by mouth every evening. 02/20/19   [provider]  zinc gluconate 50 MG tablet  Take 50 mg by mouth every evening.    [provider]    Allergies    Codeine  Review of Systems   Review of Systems  Constitutional: Negative for chills, diaphoresis and fever.  Cardiovascular: Negative for chest pain.  Gastrointestinal: Negative for abdominal pain, nausea and vomiting.  Neurological: Positive for syncope.    Physical Exam Updated Vital Signs BP (!) 114/54   Pulse 78   Temp 99 F (37.2 C) (Oral)   Resp 16   SpO2 98%   Physical Exam Vitals and nursing note reviewed.  Constitutional:      General: She is not in acute distress.    Appearance: She is well-developed.  HENT:     Head: Normocephalic.  Eyes:     General:        Right eye: No discharge.        Left eye: No discharge.     Extraocular Movements: Extraocular movements intact.     Conjunctiva/sclera: Conjunctivae normal.     Pupils: Pupils are equal, round, and reactive to light.  Neck:     Vascular: No JVD.     Trachea: No tracheal deviation.  Cardiovascular:     Rate and Rhythm: Bradycardia present. Rhythm irregular.     Pulses: Normal pulses.     Comments: 2+ radial and DP/PT pulses bilaterally, Homans sign absent bilaterally, no lower extremity edema, no palpable cords, compartments are soft  Pulmonary:     Effort: Pulmonary effort is normal.     Breath sounds: Normal breath sounds.  Abdominal:     General: Bowel sounds are normal. There is no distension.     Palpations: Abdomen is soft.     Tenderness: There is no abdominal tenderness. There is no guarding or rebound.  Musculoskeletal:     Cervical back: Neck supple.  Skin:    General: Skin is warm and dry.     Findings: No erythema.  Neurological:     Mental Status: She is alert and oriented to person, place, and time.     Cranial Nerves: No cranial nerve deficit.     Sensory: No sensory deficit.     Motor: No weakness.     Comments: Mental Status:  Alert, thought content appropriate, able to give a coherent history.  Speech fluent without evidence of aphasia. Able to follow 2 step commands without difficulty.  Cranial Nerves:  II:  Peripheral visual fields grossly normal, pupils equal, round, reactive to light III,IV, VI: ptosis not present, extra-ocular motions intact bilaterally  V,VII: smile symmetric, facial light touch sensation equal VIII: hearing grossly normal to voice  X: uvula elevates symmetrically  XI: bilateral shoulder shrug symmetric and strong XII: midline tongue extension without fassiculations Motor:  Normal tone. 5/5 strength of BUE and BLE major muscle groups including strong and equal grip strength and dorsiflexion/plantar flexion Sensory: light touch normal in all extremities.   Psychiatric:  Behavior: Behavior normal.     ED Results / Procedures / Treatments   Labs (all labs ordered are listed, but only abnormal results are displayed) Labs Reviewed  BASIC METABOLIC PANEL - Abnormal; Notable for the following components:      Result Value   Sodium 134 (*)    CO2 20 (*)    Glucose, Bld 135 (*)    BUN 34 (*)    Creatinine, Ser 1.24 (*)    GFR calc non Af Amer 39 (*)    GFR calc Af Amer 45 (*)    All other components within normal limits  CBC - Abnormal; Notable for the following components:   RBC 3.37 (*)    Hemoglobin 10.3 (*)    HCT 31.8 (*)    All other components within normal limits  URINALYSIS, ROUTINE W REFLEX MICROSCOPIC - Abnormal; Notable for the following components:   APPearance HAZY (*)    Hgb urine dipstick LARGE (*)    Leukocytes,Ua MODERATE (*)    Bacteria, UA MANY (*)    All other components within normal limits  SARS CORONAVIRUS 2 BY RT PCR (HOSPITAL ORDER, [facility])  MAGNESIUM  TSH  CBC  CREATININE, SERUM  CBG MONITORING, ED  TROPONIN I (HIGH SENSITIVITY)  TROPONIN I (HIGH SENSITIVITY)    EKG EKG Interpretation  Date/Time:  Saturday November 07 2019 14:52:18 EDT Ventricular Rate:  69 PR  Interval:  220 QRS Duration: 112 QT Interval:  446 QTC Calculation: 477 R Axis:   104 Text Interpretation: Sinus rhythm with 1st degree A-V block Rightward axis ST & T wave abnormality, consider inferior ischemia ST & T wave abnormality, consider anterior ischemia Abnormal ECG likely repol changes from IVCD Confirmed by Merrily Pew [phone]) on 11/08/2019 6:37:08 AM   Radiology CT Head Wo Contrast  Result Date: 11/08/2019 CLINICAL DATA:  Minor head trauma EXAM: CT HEAD WITHOUT CONTRAST TECHNIQUE: Contiguous axial images were obtained from the base of the skull through the vertex without intravenous contrast. COMPARISON:  None FINDINGS: Brain: No evidence of acute infarction, hemorrhage, hydrocephalus, extra-axial collection or mass lesion/mass effect. Evidence of atrophy and chronic microvascular ischemic change. Vascular: No hyperdense vessel or unexpected calcification. Skull: Normal. Negative for fracture or focal lesion. Sinuses/Orbits: Visualized paranasal sinuses are unremarkable. Orbits without acute process. Other: Small amount of stranding in the LEFT posterior parieto-occipital scalp near the vertex. IMPRESSION: 1. No acute intracranial abnormality. 2. Small amount of stranding in the LEFT posterior parieto-occipital scalp near the vertex. Electronically Signed   By: Zetta Bills M.D.   On: 11/08/2019 10:09   DG Chest Portable 1 View  Result Date: 11/08/2019 CLINICAL DATA:  86 year old female with history of syncope. EXAM: PORTABLE CHEST 1 VIEW COMPARISON:  No priors. FINDINGS: Transcutaneous defibrillator pad projecting over the lower left hemithorax. Lung volumes are normal. No consolidative airspace disease. No pleural effusions. No pneumothorax. No pulmonary nodule or mass noted. Pulmonary vasculature and the cardiomediastinal silhouette are within normal limits. IMPRESSION: 1.  No radiographic evidence of acute cardiopulmonary disease. Electronically Signed   By: Vinnie Langton M.D.    On: 11/08/2019 08:56    Procedures .Critical Care Performed by: Renita Papa, PA-C Authorized by: Renita Papa, PA-C   Critical care provider statement:    Critical care time (minutes):  45   Critical care was necessary to treat or prevent imminent or life-threatening deterioration of the following conditions:  Cardiac failure   Critical care was  time spent personally by me on the following activities:  Discussions with consultants, evaluation of patient's response to treatment, examination of patient, ordering and performing treatments and interventions, ordering and review of laboratory studies, ordering and review of radiographic studies, pulse oximetry, re-evaluation of patient's condition, obtaining history from patient or surrogate and review of old charts   (including critical care time)  Medications Ordered in ED Medications  aspirin EC tablet 81 mg (has no administration in time range)  rosuvastatin (CRESTOR) tablet 5 mg (has no administration in time range)  raloxifene (EVISTA) tablet 60 mg (has no administration in time range)  pantoprazole (PROTONIX) EC tablet 40 mg (has no administration in time range)  cholecalciferol (VITAMIN D) tablet 2,000 Units (has no administration in time range)  loratadine (CLARITIN) tablet 10 mg (has no administration in time range)  hydroxypropyl methylcellulose / hypromellose (ISOPTO TEARS / GONIOVISC) 2.5 % ophthalmic solution 1 drop (has no administration in time range)  nitroGLYCERIN (NITROSTAT) SL tablet 0.4 mg (has no administration in time range)  acetaminophen (TYLENOL) tablet 650 mg (has no administration in time range)  ondansetron (ZOFRAN) injection 4 mg (has no administration in time range)  heparin injection 5,000 Units (has no administration in time range)  sodium chloride 0.9 % bolus 500 mL (500 mLs Intravenous New Bag/Given 11/08/19 3903)    ED Course  I have reviewed the triage vital signs and the nursing notes.  Pertinent  labs & imaging results that were available during my care of the patient were reviewed by me and considered in my medical decision making (see chart for details).    MDM Rules/Calculators/A&P                          Patient presenting for evaluation after syncopal episode. She is afebrile, bradycardic intermittently in the ED with a regular heartbeat. Vital signs otherwise stable. She is nontoxic in appearance. She is neurovascularly intact. No midline spine tenderness or signs of serious trauma involving the head, chest, abdomen or pelvis. No focal neurologic deficits noted on initial examination. She takes a baby aspirin but is not anticoagulated otherwise.  EKG concerning for bradycardia with high-grade block. Will emergently consult cardiology for further recommendations.  7:10AM CONSULT: Spoke with Dr. Gardiner Rhyme with on call cardiology service who reviewed patient's EKG. Unclear the etiology of patient's rhythm, appears to be 2-1 bradycardia. With syncope and bradycardia, she warrants pacemaker evaluation. Cardiology will plan to see the patient emergently in the ED.    Lab work reviewed and interpreted by myself shows no leukocytosis, mild anemia, no metabolic derangements. BUN and creatinine are elevated suggesting AKI in the setting of dehydration as patient admits that she is not drinking enough water. She could be orthostatic additionally contributing to her symptoms of syncope and near syncope. Her UA is suggestive of possible UTI, but she is asymptomatic so I think it would be reasonable to culture the urine and hold off on treatment at this time.   Plan for cardiology admission for further evaluation and management of bradycardia with syncope.    Final Clinical Impression(s) / ED Diagnoses Final diagnoses:  Bradycardia  Syncope and collapse    Rx / DC Orders ED Discharge Orders    None       Debroah Baller 11/08/19 1227    Mesner, Corene Cornea, MD 11/09/19 0221

## 2019-11-08 NOTE — ED Notes (Signed)
Updated patient's daughter, Jeannene Patella, via telephone

## 2019-11-08 NOTE — ED Notes (Signed)
Report attempted to inpatient floor

## 2019-11-08 NOTE — H&P (Addendum)
Cardiology Admission History and Physical:   Patient ID: CHERYSH Dillon MRN: [id number]; DOB: May 01, 1929   Admission date: 11/07/2019  Primary Care Provider: [name], MD [facility] Cardiologist:New to Dr. Gardiner Rhyme  Chief Complaint: Syncope  Patient Profile:   Tammy Dillon is a 86 y.o. female with no significant past medical history except for hypertension and nausea presented by EMS for evaluation of syncope.  History of Present Illness:   Ms. Tammy Dillon was in usual state of health up until yesterday afternoon when had syncope episode.  She was at her daughter's house and talking with family while standing.  Suddenly felt backward on hardwood floor.  No prodromal symptoms.  Witnessed by family.  Upon arrival of EMS the patient was oriented.  Unknown downtime.  Patient does not recall event.  Patient received second dose of Pfizer vaccine on Friday.  Patient reports 3 to 4 weeks history of intermittent dizziness lasting for 10 seconds or so.  No prior syncope.  Patient walks 5 times per week for about 20 minutes.  No exertional chest pain or shortness of breath.  She denies palpitation, orthopnea, PND, lower extremity edema or melena.  No prior history of stroke or MI.  Patient reports not drinking enough water every day.  BUN/creatinine 34/1.24.  Hemoglobin 10.3.  Pending Covid  Result, CXR and troponin. Telemetry: HR in 60s but intermittently in upper 30s with 2:1 bradycardia    Past Medical History:  Diagnosis Date  . Complication of anesthesia    Nausea/Vomiting  . Hypertension     Past Surgical History:  Procedure Laterality Date  . ERCP N/A 02/27/2019   Procedure: ENDOSCOPIC RETROGRADE CHOLANGIOPANCREATOGRAPHY (ERCP);  Surgeon: [name], MD;  Location: [facility];  Service: Endoscopy;  Laterality: N/A;  . REMOVAL OF STONES  02/27/2019   Procedure: REMOVAL OF STONES;  Surgeon: [name], MD;  Location: [facility];  Service: Endoscopy;;  . [name]   02/27/2019   Procedure: [name];  Surgeon: [name], MD;  Location: [facility];  Service: Endoscopy;;     Medications Prior to Admission: Prior to Admission medications   Medication Sig Start Date End Date Taking? Authorizing Provider  aspirin 81 MG EC tablet Take 81 mg by mouth See admin instructions. Monday- Friday    [provider]  Cholecalciferol (VITAMIN D3) 50 MCG (2000 UT) TABS Take 2,000 Units by mouth every evening.    [provider]  desloratadine (CLARINEX) 5 MG tablet Take 5 mg by mouth every evening.  02/20/19   [provider]  hydroxypropyl methylcellulose / hypromellose (ISOPTO TEARS / GONIOVISC) 2.5 % ophthalmic solution Place 1 drop into both eyes daily as needed for dry eyes.    [provider]  metoprolol succinate (TOPROL-XL) 25 MG 24 hr tablet Take 25 mg by mouth every evening. 02/20/19   [provider]  omeprazole (PRILOSEC) 20 MG capsule Take 20 mg by mouth daily.    [provider]  OVER THE COUNTER MEDICATION Take 7.5-10 mLs by mouth daily. Elementall Silver Colloidal    [provider]  raloxifene (EVISTA) 60 MG tablet Take 60 mg by mouth every evening. 02/20/19   [provider]  rosuvastatin (CRESTOR) 5 MG tablet Take 5 mg by mouth every Monday, Wednesday, and Friday. In the evening 02/20/19   [provider]  telmisartan-hydrochlorothiazide (MICARDIS HCT) 80-25 MG tablet Take 0.5 tablets by mouth every evening. 02/20/19   [provider]  zinc gluconate 50 MG tablet Take 50 mg  by mouth every evening.    [provider]     Allergies:    Allergies  Allergen Reactions  . Codeine Nausea And Vomiting    Social History:   Social History   Socioeconomic History  . Marital status: Widowed    Spouse name: Not on file  . Number of children: Not on file  . Years of education: Not on file  . Highest education level: Not on file  Occupational History  .  Not on file  Tobacco Use  . Smoking status: Not on file  Substance and Sexual Activity  . Alcohol use: Not on file  . Drug use: Not on file  . Sexual activity: Not on file  Other Topics Concern  . Not on file  Social History Narrative  . Not on file   Social Determinants of Health   Financial Resource Strain:   . Difficulty of Paying Living Expenses: Not on file  Food Insecurity:   . Worried About Charity fundraiser in the Last Year: Not on file  . Ran Out of Food in the Last Year: Not on file  Transportation Needs:   . Lack of Transportation (Medical): Not on file  . Lack of Transportation (Non-Medical): Not on file  Physical Activity:   . Days of Exercise per Week: Not on file  . Minutes of Exercise per Session: Not on file  Stress:   . Feeling of Stress : Not on file  Social Connections:   . Frequency of Communication with Friends and Family: Not on file  . Frequency of Social Gatherings with Friends and Family: Not on file  . Attends Religious Services: Not on file  . Active Member of Clubs or Organizations: Not on file  . Attends Archivist Meetings: Not on file  . Marital Status: Not on file  Intimate Partner Violence:   . Fear of Current or Ex-Partner: Not on file  . Emotionally Abused: Not on file  . Physically Abused: Not on file  . Sexually Abused: Not on file    Family History:   No family hx of cardiac disease.   ROS:  Please see the history of present illness.  All other ROS reviewed and negative.     Physical Exam/Data:   Vitals:   11/07/19 2058 11/07/19 2357 11/08/19 0304 11/08/19 0556  BP: (!) 114/55 (!) 116/49 (!) 127/52 (!) 124/47  Pulse: (!) 56 (!) 59 (!) 53 (!) 59  Resp: 16 18 16 17   Temp: 98.1 F (36.7 C) 98.5 F (36.9 C) 98.5 F (36.9 C) 99 F (37.2 C)  TempSrc: Oral Oral Oral Oral  SpO2: 98% 96% 98% 96%   No intake or output data in the 24 hours ending 11/08/19 0825 Last 3 Weights 02/27/2019  Weight (lbs) 110 lb   Weight (kg) 49.896 kg     There is no height or weight on file to calculate BMI.  General:  Well nourished, well developed, in no acute distress HEENT: normal Lymph: no adenopathy Neck: no JVD Endocrine:  No thryomegaly Vascular: No carotid bruits; FA pulses 2+ bilaterally without bruits  Cardiac:  normal S1, S2; RRR; no murmur Lungs:  clear to auscultation bilaterally, no wheezing, rhonchi or rales  Abd: soft, nontender, no hepatomegaly  Ext: no edema Musculoskeletal:  No deformities, BUE and BLE strength normal and equal Skin: warm and dry  Neuro:  CNs 2-12 intact, no focal abnormalities noted Psych:  Normal affect    EKG:  The ECG that was done today was personally reviewed and demonstrates Sinus bradycardia at 2:1 block  Relevant CV Studies: As above  Laboratory Data:  High Sensitivity Troponin:  No results for input(s): TROPONINIHS in the last 720 hours.  Pending result     Chemistry Recent Labs  Lab 11/07/19 1450  NA 134*  K 4.4  CL 104  CO2 20*  GLUCOSE 135*  BUN 34*  CREATININE 1.24*  CALCIUM 8.9  GFRNONAA 39*  GFRAA 45*  ANIONGAP 10    Hematology Recent Labs  Lab 11/07/19 1450  WBC 10.3  RBC 3.37*  HGB 10.3*  HCT 31.8*  MCV 94.4  MCH 30.6  MCHC 32.4  RDW 13.2  PLT 227    Radiology/Studies:  No results found.   Assessment and Plan:   1. Syncope/Bradycardia - She has hx of intermittent dizziness without prodrome for the past 3-4 weeks. Yesterday she had witnessed syncope while taking with family. No prodromal symptoms. At baseline, she is active without exertional chest pain and shortness of breath. EKG and telemetry shows 2:1 bradycardia, likely blocked PACs. Stop metoprolol and avoid AV blocking agent. Get echocardiogram and TSH. Bedside pacer has been applied. Observe on telemetry. Likely pacemaker if remains bradycardic after BB washout.   2. HTN - Stop Toprol XL given bradycardia - Hold Micardis-HCT given AKI - blood pressure  stable  3. AKI - Patient admits not drinking enough water - Hold Micardiis as above  Severity of Illness: The appropriate patient status for this patient is OBSERVATION. Observation status is judged to be reasonable and necessary in order to provide the required intensity of service to ensure the patient's safety. The patient's presenting symptoms, physical exam findings, and initial radiographic and laboratory data in the context of their medical condition is felt to place them at decreased risk for further clinical deterioration. Furthermore, it is anticipated that the patient will be medically stable for discharge from the hospital within 2 midnights of admission. The following factors support the patient status of observation.   " The patient's presenting symptoms include Syncope. " The physical exam findings include None " The initial radiographic and laboratory data are - elevated renal function and bradycardia      For questions or updates, please contact [place] Please consult [email or web address] for contact info under    Patient seen and examined.  Agree with above documentation.  Ms. Rodriges is an 86 year old female with a history of hypertension who presented with syncope.  She reports that over the last several weeks she has had been having episodes of dizziness.  States that occurs 1-2 times per week, will last for 10 seconds or so and resolve.  However yesterday reports that she was standing and talking with her family when she had sudden onset of dizziness and briefly lost consciousness and fell backwards.  States that she hit the back of her head on the hardwood floor.  Her family witnessed the event and called EMS.  She reports no period of confusion afterwards.  On presentation to the ED, vital signs showed BP 138/68, pulse 69, SPO2 94% on room air.  Labs notable for creatinine 1.2, potassium 4.4, hemoglobin 10.3, platelets 227, WBC 10.3, Covid negative, high-sensitivity troponin  16 >20.  Head CT showed no acute intracranial abnormality.  EKG shows sinus rhythm, rate 34, right bundle branch block, 2:1 AV block versus blocked PACs, appears most consistent with blocked PACs.  Telemetry shows sinus bradycardia with rates down to 30s, with  2:1 block likely due to blocked PACs.  For her syncopal episode, suspect secondary to bradyarrythmia.   Notably, she is on Toprol-XL 25 mg daily, though last dose was the evening of 8/20.  Will monitor on telemetry.  If continues to have bradyarrhythmia as BB washes out, will plan for EP consult tomorrow for PPM evaluation.    Jarrett Soho, [place]  11/08/2019 8:25 AM

## 2019-11-08 NOTE — Progress Notes (Signed)
  Echocardiogram 2D Echocardiogram has been performed.  Tammy Dillon 11/08/2019, 4:54 PM

## 2019-11-09 ENCOUNTER — Encounter (HOSPITAL_COMMUNITY): Payer: Self-pay | Admitting: Cardiology

## 2019-11-09 ENCOUNTER — Encounter (HOSPITAL_COMMUNITY)
Admission: EM | Disposition: A | Discharge status: Home / Self Care | Payer: Self-pay | Source: Home / Self Care | Attending: Cardiology

## 2019-11-09 DIAGNOSIS — I441 Atrioventricular block, second degree: Secondary | ICD-10-CM

## 2019-11-09 HISTORY — PX: PACEMAKER IMPLANT: EP1218

## 2019-11-09 LAB — SURGICAL PCR SCREEN
MRSA, PCR: NEGATIVE
Staphylococcus aureus: NEGATIVE

## 2019-11-09 LAB — BASIC METABOLIC PANEL
Anion gap: 7 (ref 5–15)
BUN: 29 mg/dL — ABNORMAL HIGH (ref 8–23)
CO2: 23 mmol/L (ref 22–32)
Calcium: 9.1 mg/dL (ref 8.9–10.3)
Chloride: 109 mmol/L (ref 98–111)
Creatinine, Ser: 1.16 mg/dL — ABNORMAL HIGH (ref 0.44–1.00)
GFR calc Af Amer: 49 mL/min — ABNORMAL LOW (ref >60)
GFR calc non Af Amer: 42 mL/min — ABNORMAL LOW (ref >60)
Glucose, Bld: 90 mg/dL (ref 70–99)
Potassium: 4 mmol/L (ref 3.5–5.1)
Sodium: 139 mmol/L (ref 135–145)

## 2019-11-09 LAB — CBC
HCT: 29.6 % — ABNORMAL LOW (ref 36.0–46.0)
Hemoglobin: 9.6 g/dL — ABNORMAL LOW (ref 12.0–15.0)
MCH: 30 pg (ref 26.0–34.0)
MCHC: 32.4 g/dL (ref 30.0–36.0)
MCV: 92.5 fL (ref 80.0–100.0)
Platelets: 198 10*3/uL (ref 150–400)
RBC: 3.2 MIL/uL — ABNORMAL LOW (ref 3.87–5.11)
RDW: 13.2 % (ref 11.5–15.5)
WBC: 7.1 10*3/uL (ref 4.0–10.5)
nRBC: 0 % (ref 0.0–0.2)

## 2019-11-09 LAB — TSH: TSH: 8.786 u[IU]/mL — ABNORMAL HIGH (ref 0.350–4.500)

## 2019-11-09 SURGERY — PACEMAKER IMPLANT

## 2019-11-09 MED ORDER — CEFAZOLIN SODIUM-DEXTROSE 2-4 GM/100ML-% IV SOLN
2.0000 g | INTRAVENOUS | Status: AC
  Administered 2019-11-09: 2 g via INTRAVENOUS

## 2019-11-09 MED ORDER — CEFAZOLIN SODIUM-DEXTROSE 2-4 GM/100ML-% IV SOLN
INTRAVENOUS | Status: AC
  Filled 2019-11-09: qty 100

## 2019-11-09 MED ORDER — SODIUM CHLORIDE 0.9% FLUSH: 3.0000 mL | INTRAVENOUS | Status: DC | PRN

## 2019-11-09 MED ORDER — SODIUM CHLORIDE 0.9% FLUSH
3.0000 mL | Freq: Two times a day (BID) | INTRAVENOUS | Status: DC
  Administered 2019-11-09 – 2019-11-10 (×3): 3 mL via INTRAVENOUS

## 2019-11-09 MED ORDER — CEFAZOLIN SODIUM-DEXTROSE 1-4 GM/50ML-% IV SOLN
1.0000 g | Freq: Four times a day (QID) | INTRAVENOUS | Status: AC
  Administered 2019-11-09 – 2019-11-10 (×3): 1 g via INTRAVENOUS
  Filled 2019-11-09 (×3): qty 50

## 2019-11-09 MED ORDER — HEPARIN (PORCINE) IN NACL 1000-0.9 UT/500ML-% IV SOLN
INTRAVENOUS | Status: AC
  Filled 2019-11-09: qty 500

## 2019-11-09 MED ORDER — SODIUM CHLORIDE 0.9 % IV SOLN
80.0000 mg | INTRAVENOUS | Status: DC
  Filled 2019-11-09: qty 2

## 2019-11-09 MED ORDER — HEPARIN (PORCINE) IN NACL 1000-0.9 UT/500ML-% IV SOLN
INTRAVENOUS | Status: DC | PRN
  Administered 2019-11-09: 500 mL

## 2019-11-09 MED ORDER — LIDOCAINE HCL (PF) 1 % IJ SOLN
INTRAMUSCULAR | Status: DC | PRN
  Administered 2019-11-09: 50 mL

## 2019-11-09 MED ORDER — SODIUM CHLORIDE 0.9 % IV SOLN: 250.0000 mL | INTRAVENOUS | Status: DC

## 2019-11-09 MED ORDER — VITAMIN D3 25 MCG (1000 UNIT) PO TABS
2000.0000 [IU] | ORAL_TABLET | Freq: Every evening | ORAL | Status: DC
  Administered 2019-11-09: 2000 [IU] via ORAL
  Filled 2019-11-09 (×2): qty 2

## 2019-11-09 MED ORDER — CHLORHEXIDINE GLUCONATE 4 % EX LIQD: 60.0000 mL | Freq: Once | CUTANEOUS | Status: DC

## 2019-11-09 MED ORDER — RALOXIFENE HCL 60 MG PO TABS
60.0000 mg | ORAL_TABLET | Freq: Every evening | ORAL | Status: DC
  Administered 2019-11-09: 60 mg via ORAL
  Filled 2019-11-09: qty 1

## 2019-11-09 MED ORDER — SODIUM CHLORIDE 0.9 % IV SOLN
INTRAVENOUS | Status: DC | PRN
  Administered 2019-11-09: 250 mL via INTRAVENOUS

## 2019-11-09 MED ORDER — SODIUM CHLORIDE 0.9 % IV SOLN: INTRAVENOUS | Status: DC

## 2019-11-09 MED ORDER — LIDOCAINE HCL 1 % IJ SOLN
INTRAMUSCULAR | Status: AC
  Filled 2019-11-09: qty 60

## 2019-11-09 MED ORDER — SODIUM CHLORIDE 0.9 % IV SOLN
INTRAVENOUS | Status: AC
  Filled 2019-11-09: qty 2

## 2019-11-09 SURGICAL SUPPLY — 10 items
CABLE SURGICAL S-101-97-12 (CABLE) ×3 IMPLANT
IPG PACE AZUR XT DR MRI W1DR01 (Pacemaker) IMPLANT
LEAD CAPSURE NOVUS 5076-52CM (Lead) ×2 IMPLANT
LEAD CAPSURE NOVUS 5076-58CM (Lead) ×2 IMPLANT
PACE AZURE XT DR MRI W1DR01 (Pacemaker) ×3 IMPLANT
PAD PRO RADIOLUCENT 2001M-C (PAD) ×3 IMPLANT
SHEATH 7FR PRELUDE SNAP 13 (SHEATH) ×4 IMPLANT
SHEATH PROBE COVER 6X72 (BAG) ×2 IMPLANT
TRAY PACEMAKER INSERTION (PACKS) ×3 IMPLANT
WIRE HI TORQ VERSACORE-J 145CM (WIRE) ×2 IMPLANT

## 2019-11-09 NOTE — Consult Note (Addendum)
Cardiology Consultation:   Patient ID: Tammy Dillon MRN: [id number]; DOB: Aug 07, 1929  Admit date: 11/07/2019 Date of Consult: 11/09/2019  Primary Care Provider: [name], MD [facility] Cardiologist: new to [facility], Dr. Dagoberto Ligas [facility] Electrophysiologist:  None    Patient Profile:   Tammy Dillon is a 86 y.o. female with minimal PMhx of only HTN,  who is being seen today for the evaluation of syncope and conduction system disease at the request of Dr. Gardiner Rhyme.  History of Present Illness:   Tammy Dillon was standing talking with family when she fainted, falling backwards hitting her head.  She had no warning or presyncopal symptom.  She had had dizzy spells of late, this is the first episode of fainting.  She is quite active despite her advanced age, walking nearly daily for exercise with good exertional capacity and no symptoms No CP, palpitations or SOB.  There is some question on her last dose of metoprolol initially reported 8/20, though she states she would have taken it about 2000 on the 21st, the evening prior to coming in  LABS K+ 4.0 BUN/Creat 34/1.24 > 29/1.16 Mag 2.4 HS Trop 16 > 20 WBC 7.1 H/H 9.6/29.6 Plts 198 TSH is 8.786    Past Medical History:  Diagnosis Date  . Complication of anesthesia    Nausea/Vomiting  . Hypertension     Past Surgical History:  Procedure Laterality Date  . ERCP N/A 02/27/2019   Procedure: ENDOSCOPIC RETROGRADE CHOLANGIOPANCREATOGRAPHY (ERCP);  Surgeon: [name], MD;  Location: [facility];  Service: Endoscopy;  Laterality: N/A;  . REMOVAL OF STONES  02/27/2019   Procedure: REMOVAL OF STONES;  Surgeon: [name], MD;  Location: [facility]OS[facility];  Service: Endoscopy;;  . [name]  02/27/2019   Procedure: [name];  Surgeon: [name], MD;  Location: [facility] ENDOSCOPY;  Service: Endoscopy;;     Home Medications:  Prior to Admission medications   Medication Sig Start Date End Date Taking? Authorizing  Provider  Calcium Carbonate (CALCIUM 600 PO) Take 600 mg by mouth daily.   Yes [provider]  Cholecalciferol (VITAMIN D3) 50 MCG (2000 UT) TABS Take 2,000 Units by mouth every evening.   Yes [provider]  hydroxypropyl methylcellulose / hypromellose (ISOPTO TEARS / GONIOVISC) 2.5 % ophthalmic solution Place 1 drop into both eyes daily as needed for dry eyes.   Yes [provider]  levocetirizine (XYZAL) 5 MG tablet Take 5 mg by mouth every evening.   Yes [provider]  metoprolol succinate (TOPROL-XL) 25 MG 24 hr tablet Take 25 mg by mouth every evening. 02/20/19  Yes [provider]  OVER THE COUNTER MEDICATION Take 7.5-10 mLs by mouth See admin instructions. Superior Elemental Collodial Silver - Chemical Free- Immune Support- Take 7.5-10 ml's by mouth once a day   Yes [provider]  raloxifene (EVISTA) 60 MG tablet Take 60 mg by mouth every evening. 02/20/19  Yes [provider]  rosuvastatin (CRESTOR) 5 MG tablet Take 5 mg by mouth every Monday, Wednesday, and Friday. In the evening 02/20/19  Yes [provider]  telmisartan-hydrochlorothiazide (MICARDIS HCT) 80-25 MG tablet Take 0.5 tablets by mouth 2 (two) times a week.  02/20/19  Yes [provider]  zinc gluconate 50 MG tablet Take 50 mg by mouth every evening.   Yes [provider]  aspirin 81 MG EC tablet Take 81 mg by mouth every Monday, Tuesday, Wednesday, Thursday, and Friday. Take    [provider]  desloratadine (CLARINEX) 5 MG tablet Take 5 mg by mouth every evening.  Patient not taking: Reported on 11/08/2019 02/20/19   [provider]  omeprazole (PRILOSEC) 20 MG capsule Take 20 mg by mouth daily.    [provider]    Inpatient Medications: Scheduled Meds: . aspirin EC  81 mg Oral Daily  . chlorhexidine  60 mL Topical Once  . chlorhexidine  60 mL Topical Once  . cholecalciferol  2,000 Units Oral QPM  .  gentamicin irrigation  80 mg Irrigation To Cath  . loratadine  10 mg Oral Daily  . pantoprazole  40 mg Oral Daily  . raloxifene  60 mg Oral QPM  . rosuvastatin  5 mg Oral Q M,W,F  . sodium chloride flush  3 mL Intravenous Q12H   Continuous Infusions: . sodium chloride    . sodium chloride    .  ceFAZolin (ANCEF) IV     PRN Meds: acetaminophen, hydroxypropyl methylcellulose / hypromellose, nitroGLYCERIN, ondansetron (ZOFRAN) IV, sodium chloride flush  Allergies:    Allergies  Allergen Reactions  . Aldara [Imiquimod] Other (See Comments)    SEVERE DISCOLORATION OF THE SKIN- STOPPED BY A MD  . Other Nausea And Vomiting and Other (See Comments)    Patient requires anti-nausea medication with most ANY medication, INCLUDING anesthesia- has had this issue her whole life  . Codeine Nausea And Vomiting    Social History:   Social History   Socioeconomic History  . Marital status: Widowed    Spouse name: Not on file  . Number of children: Not on file  . Years of education: Not on file  . Highest education level: Not on file  Occupational History  . Not on file  Tobacco Use  . Smoking status: Not on file  Substance and Sexual Activity  . Alcohol use: Not on file  . Drug use: Not on file  . Sexual activity: Not on file  Other Topics Concern  . Not on file  Social History Narrative  . Not on file   Social Determinants of Health   Financial Resource Strain:   . Difficulty of Paying Living Expenses: Not on file  Food Insecurity:   . Worried About Charity fundraiser in the Last Year: Not on file  . Ran Out of Food in the Last Year: Not on file  Transportation Needs:   . Lack of Transportation (Medical): Not on file  . Lack of Transportation (Non-Medical): Not on file  Physical Activity:   . Days of Exercise per Week: Not on file  . Minutes of Exercise per Session: Not on file  Stress:   . Feeling of Stress : Not on file  Social Connections:   . Frequency of  Communication with Friends and Family: Not on file  . Frequency of Social Gatherings with Friends and Family: Not on file  . Attends Religious Services: Not on file  . Active Member of Clubs or Organizations: Not on file  . Attends Archivist Meetings: Not on file  . Marital Status: Not on file  Intimate Partner Violence:   . Fear of Current or Ex-Partner: Not on file  . Emotionally Abused: Not on file  . Physically Abused: Not on file  . Sexually Abused: Not on file    Family History:   Family History  Problem Relation Age of Onset  . Cancer Mother        breast  . Cancer - Lung Father   .  Diabetes Son      ROS:  Please see the history of present illness.  All other ROS reviewed and negative.     Physical Exam/Data:   Vitals:   11/09/19 0000 11/09/19 0415 11/09/19 0754 11/09/19 1006  BP: (!) 103/41 (!) 104/53 (!) 116/55 (!) 137/42  Pulse: 65 66 69   Resp: 20 19 19    Temp: 98.3 F (36.8 C) 97.9 F (36.6 C) 98.4 F (36.9 C)   TempSrc: Oral Oral Oral   SpO2: 99% 97% 98%   Weight:  49.9 kg      Intake/Output Summary (Last 24 hours) at 11/09/2019 1115 Last data filed at 11/09/2019 0151 Gross per 24 hour  Intake 240 ml  Output 600 ml  Net -360 ml   Last 3 Weights 11/09/2019 02/27/2019  Weight (lbs) 110 lb 110 lb  Weight (kg) 49.896 kg 49.896 kg     Body mass index is 21.48 kg/m.  General:  Well nourished, well developed, in no acute distress HEENT: normal Lymph: no adenopathy Neck: no JVD Endocrine:  No thryomegaly Vascular: No carotid bruits  Cardiac:  RRR; no murmurs, gallops or rubs Lungs: CTA b/l, no wheezing, rhonchi or rales  Abd: soft, nontender, no hepatomegaly  Ext: no edema Musculoskeletal:  No deformities, age appropriate atrophy Skin: warm and dry  Neuro:  No gross focal motor abnormalities noted Psych:  Normal affect   EKG:  The EKG was personally reviewed and demonstrates:   SR, 69bpm, 1st degree Avblock 263ms, IVCD SR 34bpm with  nonconducted beats, unable to establish degree of block vs PACs  Telemetry:  Telemetry was personally reviewed and demonstrates:   SR generally 60's, there are frequent PACs, often blocked PACs, she has had this morning Mobitz II with a 3:1 conduction, rates were in the 60's   Relevant CV Studies:  11/08/2019: TTE IMPRESSIONS  1. Left ventricular ejection fraction, by estimation, is 65 to 70%. The  left ventricle has normal function. The left ventricle has no regional  wall motion abnormalities. There is mild concentric left ventricular  hypertrophy. Left ventricular diastolic  parameters are consistent with Grade I diastolic dysfunction (impaired  relaxation).  2. Right ventricular systolic function is normal. The right ventricular  size is normal. There is normal pulmonary artery systolic pressure.  3. The mitral valve is normal in structure. No evidence of mitral valve  regurgitation. No evidence of mitral stenosis.  4. The aortic valve is normal in structure. Aortic valve regurgitation is  not visualized. Mild to moderate aortic valve sclerosis/calcification is  present, without any evidence of aortic stenosis.  5. The inferior vena cava is normal in size with greater than 50%  respiratory variability, suggesting right atrial pressure of 3 mmHg.  Laboratory Data:  High Sensitivity Troponin:   Recent Labs  Lab 11/08/19 0832 11/08/19 1130  TROPONINIHS 16 20*     Chemistry Recent Labs  Lab 11/07/19 1450 11/08/19 1130 11/09/19 0617  NA 134*  --  139  K 4.4  --  4.0  CL 104  --  109  CO2 20*  --  23  GLUCOSE 135*  --  90  BUN 34*  --  29*  CREATININE 1.24* 1.13* 1.16*  CALCIUM 8.9  --  9.1  GFRNONAA 39* 43* 42*  GFRAA 45* 50* 49*  ANIONGAP 10  --  7    No results for input(s): PROT, ALBUMIN, AST, ALT, ALKPHOS, BILITOT in the last 168 hours. Hematology Recent Labs  Lab  11/07/19 1450 11/08/19 1130 11/09/19 0617  WBC 10.3 9.8 7.1  RBC 3.37* 3.12* 3.20*   HGB 10.3* 9.5* 9.6*  HCT 31.8* 29.3* 29.6*  MCV 94.4 93.9 92.5  MCH 30.6 30.4 30.0  MCHC 32.4 32.4 32.4  RDW 13.2 13.2 13.2  PLT 227 211 198   BNPNo results for input(s): BNP, PROBNP in the last 168 hours.  DDimer No results for input(s): DDIMER in the last 168 hours.   Radiology/Studies:   CT Head Wo Contrast Result Date: 11/08/2019 CLINICAL DATA:  Minor head trauma EXAM: CT HEAD WITHOUT CONTRAST TECHNIQUE: Contiguous axial images were obtained from the base of the skull through the vertex without intravenous contrast. COMPARISON:  None FINDINGS: Brain: No evidence of acute infarction, hemorrhage, hydrocephalus, extra-axial collection or mass lesion/mass effect. Evidence of atrophy and chronic microvascular ischemic change. Vascular: No hyperdense vessel or unexpected calcification. Skull: Normal. Negative for fracture or focal lesion. Sinuses/Orbits: Visualized paranasal sinuses are unremarkable. Orbits without acute process. Other: Small amount of stranding in the LEFT posterior parieto-occipital scalp near the vertex. IMPRESSION: 1. No acute intracranial abnormality. 2. Small amount of stranding in the LEFT posterior parieto-occipital scalp near the vertex. Electronically Signed   By: Zetta Bills M.D.   On: 11/08/2019 10:09   DG Chest Portable 1 View Result Date: 11/08/2019 CLINICAL DATA:  86 year old female with history of syncope. EXAM: PORTABLE CHEST 1 VIEW COMPARISON:  No priors. FINDINGS: Transcutaneous defibrillator pad projecting over the lower left hemithorax. Lung volumes are normal. No consolidative airspace disease. No pleural effusions. No pneumothorax. No pulmonary nodule or mass noted. Pulmonary vasculature and the cardiomediastinal silhouette are within normal limits. IMPRESSION: 1.  No radiographic evidence of acute cardiopulmonary disease. Electronically Signed   By: Vinnie Langton M.D.   On: 11/08/2019 08:56   {  Assessment and Plan:   1. Syncope     Abrupt  nature is concerning for cardiac   Upon arrival and early in her admission she has had rates to 30's-40s with what often appeared to be blocked APCs (on metoprolol) and other strips that at least briefly marched out with 2:1 AVblock. She has been off her metoprolol (toprol 25mg  daily) now over 36hours and this morning though overall HR remained 60's had 3:1 Mobitz II heart block  Dr. Quentin Ore has seen and examined the patient, discussed recommendation for PPM implant and rational. Discussed the procedure, potential risks and benefits and she is agreeable to proceed. He has also had opportunity to discuss with her son Quita Skye via telephone who is also agreeable.  He mentioned the EMS mentioned she had "flat lined" for a few seconds, though I do not see this in her record.  2. HTN     Looks OK here     Can resume her BB post pacing    For questions or updates, please contact [place] Please consult [email or web address] for contact info under    Signed, Baldwin Jamaica, PA-C  11/09/2019 11:15 AM

## 2019-11-09 NOTE — Discharge Summary (Signed)
ELECTROPHYSIOLOGY PROCEDURE DISCHARGE SUMMARY    Patient ID: Tammy Dillon,  MRN: [id number], DOB/AGE: 1929/10/25 86 y.o.  Admit date: 11/07/2019 Discharge date: 11/07/2019  Primary Care Physician: [name], MD  Primary Cardiologist: New to [facility], Dr. Gardiner Rhyme Electrophysiologist: new, Dr. Quentin Ore  Primary Discharge Diagnosis:  1. Syncope 2. Symptomatic bradycardia  3. Advanced heart block 4. Abnormal TSH  Secondary Discharge Diagnosis:  1. HTN  Allergies  Allergen Reactions  . Aldara [Imiquimod] Other (See Comments)    SEVERE DISCOLORATION OF THE SKIN- STOPPED BY A MD  . Other Nausea And Vomiting and Other (See Comments)    Patient requires anti-nausea medication with most ANY medication, INCLUDING anesthesia- has had this issue her whole life  . Codeine Nausea And Vomiting     Procedures This Admission:  1.  Implantation of a MDT dual chamber PPM on 11/09/2019 by Dr Quentin Ore.  The patient received Medtronic Azure XT DR MRI SureScan (serial number [id number]) pacemaker, Medtronic model [id number] (serial number [id number]) right atrial lead and a Medtronic model 5076 (serial number [id number]) right ventricular lead  There were no immediate post procedure complications. 2.  CXR on 11/10/2019 demonstrated no pneumothorax status post device implantation.    Brief HPI: Tammy Dillon is a 86 y.o. female w/PMHx of only HTN was standing talking with family when she fainted, falling backwards hitting her head.  She had no warning or presyncopal symptom.  She had had dizzy spells of late, this is the first episode of fainting. She is quite active despite her advanced age, walking nearly daily for exercise with good exertional capacity and no symptoms No CP, palpitations or SOB. She was observed to have transient bradycardic rates to the 30's associated with largely blocked PAcs though as well would have 2:1 AVblock.  Hospital Course:  The patient was admitted her home  metoprolol held.  Her labs largely unremarkable, HS Trop 16 and 20, no anginal symptoms.  Her TSH mildly elevated not felt to be contributing to AV block. Her HR and conduction did seem to improve some, EP was asked to weigh in noting despite being off her betablocker she continued to have evidence of heart block with 3:1 conduction, though rates better 60's.  TTE noted preserved LVEF, no WMA, grade I DD, no significant VHD.  She was recommended and was agreeable to PPM.  She underwent implantation of a PPM with details as outlined above.  She was monitored on telemetry overnight which demonstrated SR with VP.  Left chest was without hematoma , minimal ecchymosis lateral to site.  The device was interrogated and found to be functioning normally.  CXR was obtained and demonstrated no pneumothorax status post device implantation.  Wound care, arm mobility, and restrictions were reviewed with the patient.  The patient feels well, no CP or SOB, she was examined by Dr. Quentin Ore and considered stable for discharge to home.   Off her metoprolol her her BP largely has been OK.  Will keep her off her metoprolol for now and she will follow up with her PMD for BP management and revisit her TSH, these things were discussed with the patient. BP this AM taken by myself is 148/78, will resume her home telmisartan/HCTZ, not her metoprolol  Physical Exam: Vitals:   11/09/19 2200 11/10/19 0311 11/10/19 0500 11/10/19 0532  BP: (!) 106/52 (!) 133/56  (!) 99/51  Pulse: 62 81  75  Resp:  18  18  Temp:  98.9 F (37.2 C)  98.4 F (36.9 C)  TempSrc:  Oral  Oral  SpO2:  98%  100%  Weight:   54 kg   Height:        GEN- The patient is well appearing, alert and oriented x 3 today.   HEENT: normocephalic, atraumatic; sclera clear, conjunctiva pink; hearing intact; oropharynx clear; neck supple, no JVP Lungs- CTA b/l, normal work of breathing.  No wheezes, rales, rhonchi Heart- RRR, no murmurs, rubs or gallops, PMI not  laterally displaced GI- soft, non-tender, non-distended Extremities- no clubbing, cyanosis, or edema MS- no significant deformity or atrophy Skin- warm and dry, no rash or lesion,  left chest without hematoma, + minimal ecchymosis Psych- euthymic mood, full affect Neuro- no gross deficits   Labs:   Lab Results  Component Value Date   WBC 7.1 11/09/2019   HGB 9.6 (L) 11/09/2019   HCT 29.6 (L) 11/09/2019   MCV 92.5 11/09/2019   PLT 198 11/09/2019    Recent Labs  Lab 11/09/19 0617  NA 139  K 4.0  CL 109  CO2 23  BUN 29*  CREATININE 1.16*  CALCIUM 9.1  GLUCOSE 90    Discharge Medications:  Allergies as of 11/10/2019      Reactions   Aldara [imiquimod] Other (See Comments)   SEVERE DISCOLORATION OF THE SKIN- STOPPED BY A MD   Other Nausea And Vomiting, Other (See Comments)   Patient requires anti-nausea medication with most ANY medication, INCLUDING anesthesia- has had this issue her whole life   Codeine Nausea And Vomiting      Medication List    STOP taking these medications   metoprolol succinate 25 MG 24 hr tablet Commonly known as: TOPROL-XL     TAKE these medications   aspirin 81 MG EC tablet Take 81 mg by mouth every Monday, Tuesday, Wednesday, Thursday, and Friday. Take Notes to patient: Do not resume until 11/12/2019   CALCIUM 600 PO Take 600 mg by mouth daily.   desloratadine 5 MG tablet Commonly known as: CLARINEX Take 5 mg by mouth every evening. Notes to patient: This appears to be an inactive medicine, please discuss with your primary doctor   hydroxypropyl methylcellulose / hypromellose 2.5 % ophthalmic solution Commonly known as: ISOPTO TEARS / GONIOVISC Place 1 drop into both eyes daily as needed for dry eyes.   levocetirizine 5 MG tablet Commonly known as: XYZAL Take 5 mg by mouth every evening.   omeprazole 20 MG capsule Commonly known as: PRILOSEC Take 20 mg by mouth daily.   OVER THE COUNTER MEDICATION Take 7.5-10 mLs by mouth  See admin instructions. Superior Elemental Collodial Silver - Chemical Free- Immune Support- Take 7.5-10 ml's by mouth once a day   raloxifene 60 MG tablet Commonly known as: EVISTA Take 60 mg by mouth every evening.   rosuvastatin 5 MG tablet Commonly known as: CRESTOR Take 5 mg by mouth every Monday, Wednesday, and Friday. In the evening   telmisartan-hydrochlorothiazide 80-25 MG tablet Commonly known as: MICARDIS HCT Take 0.5 tablets by mouth 2 (two) times a week.   Vitamin D3 50 MCG (2000 UT) Tabs Take 2,000 Units by mouth every evening.   zinc gluconate 50 MG tablet Take 50 mg by mouth every evening.            Discharge Care Instructions  (From admission, onward)         Start     Ordered   11/10/19 0000  Discharge wound  care:       Comments: As per discharge instructions   11/10/19 0859          Disposition:  Home Discharge Instructions    Diet - low sodium heart healthy   Complete by: As directed    Discharge wound care:   Complete by: As directed    As per discharge instructions   Increase activity slowly   Complete by: As directed       Follow-up Information    [place] [facility] Follow up.   Specialty: Cardiology Why: 11/19/2019 @ 3:00PM, wound check visit Contact information: [address], [address]       Vickie Epley, MD Follow up.   Specialty: Cardiology Why: 02/15/2020 @ 2:40PM Contact information: [place] [address] [phone]        [name], MD Follow up.   Specialty: Internal Medicine Why: please call for a after hospital visit as we discussed in 2 weeks.  Follow up on your blood pressure and thyroid lab result Contact information: [address] [place] [address] [phone]               Duration of Discharge Encounter: Greater than 30 minutes including physician time.  Venetia Night, PA-C 11/10/2019 9:01 AM

## 2019-11-09 NOTE — Progress Notes (Signed)
Progress Note  Patient Name: Tammy Dillon Date of Encounter: 11/09/2019  [facility] Cardiologist: No primary care provider on file.   Subjective   Denies any further lightheadedness.  Inpatient Medications    Scheduled Meds: . aspirin EC  81 mg Oral Daily  . cholecalciferol  2,000 Units Oral QPM  . heparin  5,000 Units Subcutaneous Q8H  . loratadine  10 mg Oral Daily  . pantoprazole  40 mg Oral Daily  . raloxifene  60 mg Oral QPM  . rosuvastatin  5 mg Oral Q M,W,F   Continuous Infusions:  PRN Meds: acetaminophen, hydroxypropyl methylcellulose / hypromellose, nitroGLYCERIN, ondansetron (ZOFRAN) IV   Vital Signs    Vitals:   11/08/19 2108 11/09/19 0000 11/09/19 0415 11/09/19 0754  BP: 99/77 (!) 103/41 (!) 104/53 (!) 116/55  Pulse: 68 65 66 69  Resp: 20 20 19 19   Temp: 98.4 F (36.9 C) 98.3 F (36.8 C) 97.9 F (36.6 C) 98.4 F (36.9 C)  TempSrc: Oral Oral Oral Oral  SpO2: 95% 99% 97% 98%  Weight:   49.9 kg     Intake/Output Summary (Last 24 hours) at 11/09/2019 0825 Last data filed at 11/09/2019 0151 Gross per 24 hour  Intake 240 ml  Output 600 ml  Net -360 ml   Last 3 Weights 11/09/2019 02/27/2019  Weight (lbs) 110 lb 110 lb  Weight (kg) 49.896 kg 49.896 kg      Telemetry    NSR in 60s - Personally Reviewed  ECG    No new ECG - Personally Reviewed  Physical Exam   GEN: No acute distress.   Neck: No JVD Cardiac: RRR, no murmurs, rubs, or gallops.  Respiratory: Clear to auscultation bilaterally. GI: Soft, nontender, non-distended  MS: No edema; No deformity. Neuro:  Nonfocal  Psych: Normal affect   Labs    High Sensitivity Troponin:   Recent Labs  Lab 11/08/19 0832 11/08/19 1130  TROPONINIHS 16 20*      Chemistry Recent Labs  Lab 11/07/19 1450 11/08/19 1130 11/09/19 0617  NA 134*  --  139  K 4.4  --  4.0  CL 104  --  109  CO2 20*  --  23  GLUCOSE 135*  --  90  BUN 34*  --  29*  CREATININE 1.24* 1.13* 1.16*  CALCIUM  8.9  --  9.1  GFRNONAA 39* 43* 42*  GFRAA 45* 50* 49*  ANIONGAP 10  --  7     Hematology Recent Labs  Lab 11/07/19 1450 11/08/19 1130 11/09/19 0617  WBC 10.3 9.8 7.1  RBC 3.37* 3.12* 3.20*  HGB 10.3* 9.5* 9.6*  HCT 31.8* 29.3* 29.6*  MCV 94.4 93.9 92.5  MCH 30.6 30.4 30.0  MCHC 32.4 32.4 32.4  RDW 13.2 13.2 13.2  PLT 227 211 198    BNPNo results for input(s): BNP, PROBNP in the last 168 hours.   DDimer No results for input(s): DDIMER in the last 168 hours.   Radiology    CT Head Wo Contrast  Result Date: 11/08/2019 CLINICAL DATA:  Minor head trauma EXAM: CT HEAD WITHOUT CONTRAST TECHNIQUE: Contiguous axial images were obtained from the base of the skull through the vertex without intravenous contrast. COMPARISON:  None FINDINGS: Brain: No evidence of acute infarction, hemorrhage, hydrocephalus, extra-axial collection or mass lesion/mass effect. Evidence of atrophy and chronic microvascular ischemic change. Vascular: No hyperdense vessel or unexpected calcification. Skull: Normal. Negative for fracture or focal lesion. Sinuses/Orbits: Visualized paranasal sinuses are  unremarkable. Orbits without acute process. Other: Small amount of stranding in the LEFT posterior parieto-occipital scalp near the vertex. IMPRESSION: 1. No acute intracranial abnormality. 2. Small amount of stranding in the LEFT posterior parieto-occipital scalp near the vertex. Electronically Signed   By: Zetta Bills M.D.   On: 11/08/2019 10:09   DG Chest Portable 1 View  Result Date: 11/08/2019 CLINICAL DATA:  86 year old female with history of syncope. EXAM: PORTABLE CHEST 1 VIEW COMPARISON:  No priors. FINDINGS: Transcutaneous defibrillator pad projecting over the lower left hemithorax. Lung volumes are normal. No consolidative airspace disease. No pleural effusions. No pneumothorax. No pulmonary nodule or mass noted. Pulmonary vasculature and the cardiomediastinal silhouette are within normal limits.  IMPRESSION: 1.  No radiographic evidence of acute cardiopulmonary disease. Electronically Signed   By: Vinnie Langton M.D.   On: 11/08/2019 08:56   ECHOCARDIOGRAM COMPLETE  Result Date: 11/08/2019    ECHOCARDIOGRAM REPORT   Patient Name:   Tammy Dillon Date of Exam: 11/08/2019 Medical Rec #:  [id number]      Height:       60.0 in Accession #:    [id number]     Weight:       110.0 lb Date of Birth:  December 25, 1929      BSA:          1.448 m Patient Age:    39 years       BP:           146/56 mmHg Patient Gender: F              HR:           74 bpm. Exam Location:  Inpatient Procedure: 2D Echo, Cardiac Doppler and Color Doppler Indications:    Syncope  History:        Patient has no prior history of Echocardiogram examinations.                 Risk Factors:Hypertension.  Sonographer:    Clayton Lefort RDCS (AE) Referring Phys: [id number] Sims  1. Left ventricular ejection fraction, by estimation, is 65 to 70%. The left ventricle has normal function. The left ventricle has no regional wall motion abnormalities. There is mild concentric left ventricular hypertrophy. Left ventricular diastolic parameters are consistent with Grade I diastolic dysfunction (impaired relaxation).  2. Right ventricular systolic function is normal. The right ventricular size is normal. There is normal pulmonary artery systolic pressure.  3. The mitral valve is normal in structure. No evidence of mitral valve regurgitation. No evidence of mitral stenosis.  4. The aortic valve is normal in structure. Aortic valve regurgitation is not visualized. Mild to moderate aortic valve sclerosis/calcification is present, without any evidence of aortic stenosis.  5. The inferior vena cava is normal in size with greater than 50% respiratory variability, suggesting right atrial pressure of 3 mmHg. FINDINGS  Left Ventricle: Left ventricular ejection fraction, by estimation, is 65 to 70%. The left ventricle has normal function. The left  ventricle has no regional wall motion abnormalities. The left ventricular internal cavity size was normal in size. There is  mild concentric left ventricular hypertrophy. Left ventricular diastolic parameters are consistent with Grade I diastolic dysfunction (impaired relaxation). Normal left ventricular filling pressure. Right Ventricle: The right ventricular size is normal. No increase in right ventricular wall thickness. Right ventricular systolic function is normal. There is normal pulmonary artery systolic pressure. The tricuspid regurgitant velocity is 2.74 m/s, and  with an assumed  right atrial pressure of 3 mmHg, the estimated right ventricular systolic pressure is 34.1 mmHg. Left Atrium: Left atrial size was normal in size. Right Atrium: Right atrial size was normal in size. Pericardium: There is no evidence of pericardial effusion. Mitral Valve: The mitral valve is normal in structure. Normal mobility of the mitral valve leaflets. No evidence of mitral valve regurgitation. No evidence of mitral valve stenosis. Tricuspid Valve: The tricuspid valve is normal in structure. Tricuspid valve regurgitation is mild . No evidence of tricuspid stenosis. Aortic Valve: The aortic valve is normal in structure. Aortic valve regurgitation is not visualized. Mild to moderate aortic valve sclerosis/calcification is present, without any evidence of aortic stenosis. Aortic valve mean gradient measures 4.0 mmHg. Aortic valve peak gradient measures 6.7 mmHg. Aortic valve area, by VTI measures 1.77 cm. Pulmonic Valve: The pulmonic valve was normal in structure. Pulmonic valve regurgitation is not visualized. No evidence of pulmonic stenosis. Aorta: The aortic root is normal in size and structure. Venous: The inferior vena cava is normal in size with greater than 50% respiratory variability, suggesting right atrial pressure of 3 mmHg. IAS/Shunts: No atrial level shunt detected by color flow Doppler.  LEFT VENTRICLE PLAX 2D  LVIDd:         3.80 cm  Diastology LVIDs:         2.50 cm  LV e' lateral:   8.70 cm/s LV PW:         1.20 cm  LV E/e' lateral: 8.9 LV IVS:        1.10 cm  LV e' medial:    6.09 cm/s LVOT diam:     1.80 cm  LV E/e' medial:  12.7 LV SV:         51 LV SV Index:   35 LVOT Area:     2.54 cm  RIGHT VENTRICLE          IVC RV Basal diam:  2.90 cm  IVC diam: 1.40 cm LEFT ATRIUM             Index       RIGHT ATRIUM          Index LA diam:        2.30 cm 1.59 cm/m  RA Area:     9.95 cm LA Vol (A2C):   38.3 ml 26.45 ml/m RA Volume:   20.00 ml 13.81 ml/m LA Vol (A4C):   25.4 ml 17.54 ml/m LA Biplane Vol: 32.6 ml 22.52 ml/m  AORTIC VALVE AV Area (Vmax):    1.70 cm AV Area (Vmean):   1.55 cm AV Area (VTI):     1.77 cm AV Vmax:           129.00 cm/s AV Vmean:          88.600 cm/s AV VTI:            0.288 m AV Peak Grad:      6.7 mmHg AV Mean Grad:      4.0 mmHg LVOT Vmax:         86.20 cm/s LVOT Vmean:        53.800 cm/s LVOT VTI:          0.200 m LVOT/AV VTI ratio: 0.69  AORTA Ao Root diam: 2.70 cm Ao Asc diam:  3.00 cm MITRAL VALVE               TRICUSPID VALVE MV Area (PHT): 3.31 cm    TR Peak grad:  30.0 mmHg MV Decel Time: 229 msec    TR Vmax:        274.00 cm/s MV E velocity: 77.60 cm/s MV A velocity: 69.40 cm/s  SHUNTS MV E/A ratio:  1.12        Systemic VTI:  0.20 m                            Systemic Diam: 1.80 cm Ena Dawley MD Electronically signed by Ena Dawley MD Signature Date/Time: 11/08/2019/5:21:01 PM    Final     Cardiac Studies   TTE 11/08/19: 1. Left ventricular ejection fraction, by estimation, is 65 to 70%. The  left ventricle has normal function. The left ventricle has no regional  wall motion abnormalities. There is mild concentric left ventricular  hypertrophy. Left ventricular diastolic  parameters are consistent with Grade I diastolic dysfunction (impaired  relaxation).  2. Right ventricular systolic function is normal. The right ventricular  size is normal. There is  normal pulmonary artery systolic pressure.  3. The mitral valve is normal in structure. No evidence of mitral valve  regurgitation. No evidence of mitral stenosis.  4. The aortic valve is normal in structure. Aortic valve regurgitation is  not visualized. Mild to moderate aortic valve sclerosis/calcification is  present, without any evidence of aortic stenosis.  5. The inferior vena cava is normal in size with greater than 50%  respiratory variability, suggesting right atrial pressure of 3 mmHg.   Patient Profile     86 y.o. female with a history of hypertension who presented with syncope  Assessment & Plan    Syncope/bradycardia: Reported short episodes of dizziness for prior 3 to 4 weeks, and then had witnessed syncopal episode on 8/21.  EKG with HR in 30s, 2:1 AV conduction, suspect blocked PACs.  She takes Toprol-XL 25 mg daily, last dose was evening of 8/20.  On telemetry, is in normal sinus rhythm now, no episodes of bradycardia since last night, ~48 hours after last toprol XL dose.  Echocardiogram shows normal LVEF.  Will discuss with EP.  HTN: Stopped Toprol XL given bradycardia.  Holding myocarditis-HCTZ given a mild bump in creatinine on admission, BP has been stable  For questions or updates, please contact [place] Please consult [email or web address] for contact info under        Signed, Donato Heinz, MD  11/09/2019, 8:25 AM

## 2019-11-09 NOTE — Progress Notes (Signed)
  Mobility Specialist Criteria Algorithm Info.  SATURATION QUALIFICATIONS: (This note is used to comply with regulatory documentation for home oxygen)  Patient Saturations on Room Air at Rest = 97%  Patient Saturations on Room Air while Ambulating = 95%  Patient Saturations on 0 Liters of oxygen while Ambulating = 0%  Please briefly explain why patient needs home oxygen:  Mobility Team: HOB elevated: Activity: Ambulated in hall;Ambulated to bathroom;Transferred:  Bed to chair (to chair after ambulation) Range of motion: Active;All extremities Level of assistance: Standby assist, set-up cues, supervision of patient - no hands on Assistive device: None Minutes sitting in chair:  Minutes stood: 5 minutes Minutes ambulated: 5 minutes Distance ambulated (ft): 240 ft Mobility response: Tolerated well;RN notified (Pt c/o feeling lightheaded "spacey" post ambulation) Bed Position: Chair (Recliner chair)  Pt tolerated ambulating in hallway well w/o any complaints. Once pt returned back to her room after ambulation, she c/o feeling lightheaded while standing at the bedside. Patient then sat in recliner chair and VS took via [employer]am[employer] in which BP (137/42) was abnormal according to the patient. After a few seconds of sitting the lightheadedness subsided. Pt now seated in recliner chair with all needs met and call bell within reach.   11/09/2019 10:00 AM

## 2019-11-10 ENCOUNTER — Encounter (HOSPITAL_COMMUNITY): Payer: Self-pay | Admitting: Cardiology

## 2019-11-10 ENCOUNTER — Inpatient Hospital Stay (HOSPITAL_COMMUNITY): Payer: Medicare Other

## 2019-11-10 DIAGNOSIS — R001 Bradycardia, unspecified: Secondary | ICD-10-CM

## 2019-11-10 LAB — URINE CULTURE: Culture: >=100000 — AB

## 2019-11-10 MED FILL — Lidocaine HCl Local Inj 1%: INTRAMUSCULAR | Qty: 60 | Status: AC

## 2019-11-10 MED FILL — Gentamicin Sulfate Inj 40 MG/ML: INTRAMUSCULAR | Qty: 80 | Status: AC

## 2019-11-10 NOTE — Discharge Instructions (Signed)
Please monitor your blood pressure daily, if consistently higher then 140/80, please reach out to your prmaru doctor. Follow up with her your primary doctor in the next 2 weeks as we discussed        Supplemental Discharge Instructions for  Pacemaker/Defibrillator Patients  Activity No heavy lifting or vigorous activity with your left/right arm for 6 to 8 weeks.  Do not raise your left/right arm above your head for one week.  Gradually raise your affected arm as drawn below.              11/13/2019                11/14/2019                11/15/2019               11/16/2019 __  NO DRIVING for  1 week   ; you may begin driving on   3/66/4403  .  WOUND CARE - Keep the wound area clean and dry.  Do not get this area wet,. no showers until cleared to at your wound check visit - The tape/steri-strips on your wound will fall off; do not pull them off.  No bandage is needed on the site.  DO  NOT apply any creams, oils, or ointments to the wound area. - If you notice any drainage or discharge from the wound, any swelling or bruising at the site, or you develop a fever > 101? F after you are discharged home, call the office at once.  Special Instructions - You are still able to use cellular telephones; use the ear opposite the side where you have your pacemaker/defibrillator.  Avoid carrying your cellular phone near your device. - When traveling through airports, show security personnel your identification card to avoid being screened in the metal detectors.  Ask the security personnel to use the hand wand. - Avoid arc welding equipment, MRI testing (magnetic resonance imaging), TENS units (transcutaneous nerve stimulators).  Call the office for questions about other devices. - Avoid electrical appliances that are in poor condition or are not properly grounded. - Microwave ovens are safe to be near or to operate.

## 2019-11-10 NOTE — Plan of Care (Signed)
  Problem: Education: ?Goal: Knowledge of General Education information will improve ?Description: Including pain rating scale, medication(s)/side effects and non-pharmacologic comfort measures ?Outcome: Adequate for Discharge ?  ?Problem: Health Behavior/Discharge Planning: ?Goal: Ability to manage health-related needs will improve ?Outcome: Adequate for Discharge ?  ?Problem: Clinical Measurements: ?Goal: Ability to maintain clinical measurements within normal limits will improve ?Outcome: Adequate for Discharge ?Goal: Will remain free from infection ?Outcome: Adequate for Discharge ?Goal: Diagnostic test results will improve ?Outcome: Adequate for Discharge ?Goal: Respiratory complications will improve ?Outcome: Adequate for Discharge ?Goal: Cardiovascular complication will be avoided ?Outcome: Adequate for Discharge ?  ?Problem: Activity: ?Goal: Risk for activity intolerance will decrease ?Outcome: Adequate for Discharge ?  ?Problem: Nutrition: ?Goal: Adequate nutrition will be maintained ?Outcome: Adequate for Discharge ?  ?Problem: Pain Managment: ?Goal: General experience of comfort will improve ?Outcome: Adequate for Discharge ?  ?Problem: Safety: ?Goal: Ability to remain free from injury will improve ?Outcome: Adequate for Discharge ?  ?Problem: Skin Integrity: ?Goal: Risk for impaired skin integrity will decrease ?Outcome: Adequate for Discharge ?  ?

## 2019-11-10 NOTE — Progress Notes (Signed)
D/C instructions given and reviewed with pt and son. Questions answered and encouraged to call with any f/u questions. IV and tele removed. Tolerated well.

## 2019-11-13 ENCOUNTER — Other Ambulatory Visit: Payer: Medicare Other

## 2019-11-16 DIAGNOSIS — M810 Age-related osteoporosis without current pathological fracture: Secondary | ICD-10-CM | POA: Diagnosis not present

## 2019-11-16 DIAGNOSIS — E782 Mixed hyperlipidemia: Secondary | ICD-10-CM | POA: Diagnosis not present

## 2019-11-16 DIAGNOSIS — I10 Essential (primary) hypertension: Secondary | ICD-10-CM | POA: Diagnosis not present

## 2019-11-19 ENCOUNTER — Other Ambulatory Visit: Payer: Self-pay

## 2019-11-19 ENCOUNTER — Ambulatory Visit (INDEPENDENT_AMBULATORY_CARE_PROVIDER_SITE_OTHER): Payer: Medicare Other | Admitting: Emergency Medicine

## 2019-11-19 DIAGNOSIS — I441 Atrioventricular block, second degree: Secondary | ICD-10-CM | POA: Diagnosis not present

## 2019-11-19 DIAGNOSIS — M810 Age-related osteoporosis without current pathological fracture: Secondary | ICD-10-CM | POA: Diagnosis not present

## 2019-11-19 DIAGNOSIS — E782 Mixed hyperlipidemia: Secondary | ICD-10-CM | POA: Diagnosis not present

## 2019-11-19 DIAGNOSIS — I10 Essential (primary) hypertension: Secondary | ICD-10-CM | POA: Diagnosis not present

## 2019-11-19 LAB — CUP PACEART INCLINIC DEVICE CHECK
Battery Remaining Longevity: 178 mo
Battery Voltage: 3.23 V
Brady Statistic AP VP Percent: 1.63 %
Brady Statistic AP VS Percent: 1.3 %
Brady Statistic AS VP Percent: 4.37 %
Brady Statistic AS VS Percent: 92.69 %
Brady Statistic RA Percent Paced: 2.94 %
Brady Statistic RV Percent Paced: 6.01 %
Date Time Interrogation Session: 20210902150900
Implantable Lead Implant Date: 20210823
Implantable Lead Implant Date: 20210823
Implantable Lead Location: 753859
Implantable Lead Location: 753860
Implantable Lead Model: 5076
Implantable Lead Model: 5076
Implantable Pulse Generator Implant Date: 20210823
Lead Channel Impedance Value: 361 Ohm
Lead Channel Impedance Value: 399 Ohm
Lead Channel Impedance Value: 456 Ohm
Lead Channel Impedance Value: 551 Ohm
Lead Channel Pacing Threshold Amplitude: 0.625 V
Lead Channel Pacing Threshold Amplitude: 1 V
Lead Channel Pacing Threshold Pulse Width: 0.4 ms
Lead Channel Pacing Threshold Pulse Width: 0.4 ms
Lead Channel Sensing Intrinsic Amplitude: 1 mV
Lead Channel Sensing Intrinsic Amplitude: 13.125 mV
Lead Channel Setting Pacing Amplitude: 3.5 V
Lead Channel Setting Pacing Amplitude: 3.5 V
Lead Channel Setting Pacing Pulse Width: 0.4 ms
Lead Channel Setting Sensing Sensitivity: 0.9 mV

## 2019-11-19 NOTE — Progress Notes (Signed)
Wound check appointment. Steri-strips removed. Wound without redness or edema. Incision edges approximated, wound well healed. Normal device function. Thresholds, sensing, and impedances consistent with implant measurements. Device programmed at 3.5V/auto capture programmed on for extra safety margin until 3 month visit. Histogram distribution appropriate for patient and level of activity. No mode switches or high ventricular rates noted. Patient educated about wound care, arm mobility, lifting restrictions. ROV in 3 months with implanting physician. Next home remote 02/10/20.

## 2019-11-24 DIAGNOSIS — R55 Syncope and collapse: Secondary | ICD-10-CM | POA: Diagnosis not present

## 2019-11-24 DIAGNOSIS — Z79899 Other long term (current) drug therapy: Secondary | ICD-10-CM | POA: Diagnosis not present

## 2019-11-24 DIAGNOSIS — Z950 Presence of cardiac pacemaker: Secondary | ICD-10-CM | POA: Diagnosis not present

## 2019-11-24 DIAGNOSIS — R5383 Other fatigue: Secondary | ICD-10-CM | POA: Diagnosis not present

## 2019-12-01 DIAGNOSIS — I10 Essential (primary) hypertension: Secondary | ICD-10-CM | POA: Diagnosis not present

## 2019-12-01 DIAGNOSIS — Z950 Presence of cardiac pacemaker: Secondary | ICD-10-CM | POA: Diagnosis not present

## 2019-12-01 DIAGNOSIS — R55 Syncope and collapse: Secondary | ICD-10-CM | POA: Diagnosis not present

## 2019-12-01 DIAGNOSIS — L820 Inflamed seborrheic keratosis: Secondary | ICD-10-CM | POA: Diagnosis not present

## 2019-12-01 DIAGNOSIS — H8110 Benign paroxysmal vertigo, unspecified ear: Secondary | ICD-10-CM | POA: Diagnosis not present

## 2019-12-24 DIAGNOSIS — H8110 Benign paroxysmal vertigo, unspecified ear: Secondary | ICD-10-CM | POA: Diagnosis not present

## 2019-12-24 DIAGNOSIS — I10 Essential (primary) hypertension: Secondary | ICD-10-CM | POA: Diagnosis not present

## 2019-12-24 DIAGNOSIS — R55 Syncope and collapse: Secondary | ICD-10-CM | POA: Diagnosis not present

## 2019-12-24 DIAGNOSIS — Z950 Presence of cardiac pacemaker: Secondary | ICD-10-CM | POA: Diagnosis not present

## 2019-12-24 DIAGNOSIS — L820 Inflamed seborrheic keratosis: Secondary | ICD-10-CM | POA: Diagnosis not present

## 2019-12-29 ENCOUNTER — Ambulatory Visit: Payer: Medicare Other | Attending: Internal Medicine | Admitting: Physical Therapy

## 2019-12-29 ENCOUNTER — Encounter: Payer: Self-pay | Admitting: Physical Therapy

## 2019-12-29 ENCOUNTER — Other Ambulatory Visit: Payer: Self-pay

## 2019-12-29 DIAGNOSIS — H8111 Benign paroxysmal vertigo, right ear: Secondary | ICD-10-CM | POA: Insufficient documentation

## 2019-12-29 DIAGNOSIS — R2681 Unsteadiness on feet: Secondary | ICD-10-CM | POA: Insufficient documentation

## 2019-12-29 DIAGNOSIS — R262 Difficulty in walking, not elsewhere classified: Secondary | ICD-10-CM | POA: Diagnosis not present

## 2019-12-29 DIAGNOSIS — H8113 Benign paroxysmal vertigo, bilateral: Secondary | ICD-10-CM | POA: Diagnosis not present

## 2019-12-29 NOTE — Patient Instructions (Addendum)
Rolling (Active)    Lie on back, legs straight. Draw left leg up and roll to other side. Complete _1__ sets of _5-10__ repetitions. Perform _3__ sessions per day.  Roll onto left side more than right side  Benign Positional Vertigo Vertigo is the feeling that you or your surroundings are moving when they are not. Benign positional vertigo is the most common form of vertigo. This is usually a harmless condition (benign). This condition is positional. This means that symptoms are triggered by certain movements and positions. This condition can be dangerous if it occurs while you are doing something that could cause harm to you or others. This includes activities such as driving or operating machinery. What are the causes? In many cases, the cause of this condition is not known. It may be caused by a disturbance in an area of the inner ear that helps your brain to sense movement and balance. This disturbance can be caused by:  Viral infection (labyrinthitis).  Head injury.  Repetitive motion, such as jumping, dancing, or running. What increases the risk? You are more likely to develop this condition if:  You are a woman.  You are 52 years of age or older. What are the signs or symptoms? Symptoms of this condition usually happen when you move your head or your eyes in different directions. Symptoms may start suddenly, and usually last for less than a minute. They include:  Loss of balance and falling.  Feeling like you are spinning or moving.  Feeling like your surroundings are spinning or moving.  Nausea and vomiting.  Blurred vision.  Dizziness.  Involuntary eye movement (nystagmus). Symptoms can be mild and cause only minor problems, or they can be severe and interfere with daily life. Episodes of benign positional vertigo may return (recur) over time. Symptoms may improve over time. How is this diagnosed? This condition may be diagnosed based on:  Your medical  history.  Physical exam of the head, neck, and ears.  Tests, such as: ? MRI. ? CT scan. ? Eye movement tests. Your health care provider may ask you to change positions quickly while he or she watches you for symptoms of benign positional vertigo, such as nystagmus. Eye movement may be tested with a variety of exams that are designed to evaluate or stimulate vertigo. ? An electroencephalogram (EEG). This records electrical activity in your brain. ? Hearing tests. You may be referred to a health care provider who specializes in ear, nose, and throat (ENT) problems (otolaryngologist) or a provider who specializes in disorders of the nervous system (neurologist). How is this treated?  This condition may be treated in a session in which your health care provider moves your head in specific positions to adjust your inner ear back to normal. Treatment for this condition may take several sessions. Surgery may be needed in severe cases, but this is rare. In some cases, benign positional vertigo may resolve on its own in 2-4 weeks. Follow these instructions at home: Safety  Move slowly. Avoid sudden body or head movements or certain positions, as told by your health care provider.  Avoid driving until your health care provider says it is safe for you to do so.  Avoid operating heavy machinery until your health care provider says it is safe for you to do so.  Avoid doing any tasks that would be dangerous to you or others if vertigo occurs.  If you have trouble walking or keeping your balance, try using a cane for  stability. If you feel dizzy or unstable, sit down right away.  Return to your normal activities as told by your health care provider. Ask your health care provider what activities are safe for you. General instructions  Take over-the-counter and prescription medicines only as told by your health care provider.  Drink enough fluid to keep your urine pale yellow.  Keep all follow-up  visits as told by your health care provider. This is important. Contact a health care provider if:  You have a fever.  Your condition gets worse or you develop new symptoms.  Your family or friends notice any behavioral changes.  You have nausea or vomiting that gets worse.  You have numbness or a "pins and needles" sensation. Get help right away if you:  Have difficulty speaking or moving.  Are always dizzy.  Faint.  Develop severe headaches.  Have weakness in your legs or arms.  Have changes in your hearing or vision.  Develop a stiff neck.  Develop sensitivity to light. Summary  Vertigo is the feeling that you or your surroundings are moving when they are not. Benign positional vertigo is the most common form of vertigo.  The cause of this condition is not known. It may be caused by a disturbance in an area of the inner ear that helps your brain to sense movement and balance.  Symptoms include loss of balance and falling, feeling that you or your surroundings are moving, nausea and vomiting, and blurred vision.  This condition can be diagnosed based on symptoms, physical exam, and other tests, such as MRI, CT scan, eye movement tests, and hearing tests.  Follow safety instructions as told by your health care provider. You will also be told when to contact your health care provider in case of problems. This information is not intended to replace advice given to you by your health care provider. Make sure you discuss any questions you have with your health care provider. Document Revised: 08/14/2017 Document Reviewed: 08/14/2017 Elsevier Patient Education  [place].

## 2019-12-30 NOTE — Therapy (Signed)
[facility] [address] [place], [place], [address] Phone: [phone]   Fax:  [phone]  Physical Therapy Evaluation  Patient Details  Name: Tammy Dillon MRN: [id number] Date of Birth: 09-26-1929 Referring Provider (PT): [name], MD   Encounter Date: 12/29/2019   PT End of Session - 12/30/19 1721    Visit Number 1    Number of Visits 8    Date for PT Re-Evaluation 01/29/20    Authorization Type Medicare    Authorization Time Period 12-29-19 - 03-18-20    PT Start Time 1316    PT Stop Time 1402    PT Time Calculation (min) 46 min    Activity Tolerance Patient tolerated treatment well    Behavior During Therapy [facility] for tasks assessed/performed           Past Medical History:  Diagnosis Date  . Complication of anesthesia    Nausea/Vomiting  . Hypertension     Past Surgical History:  Procedure Laterality Date  . ERCP N/A 02/27/2019   Procedure: ENDOSCOPIC RETROGRADE CHOLANGIOPANCREATOGRAPHY (ERCP);  Surgeon: [name], MD;  Location: [facility];  Service: Endoscopy;  Laterality: N/A;  . PACEMAKER IMPLANT N/A 11/09/2019   Procedure: PACEMAKER IMPLANT;  Surgeon: [name], MD;  Location: [facility];  Service: Cardiovascular;  Laterality: N/A;  . REMOVAL OF STONES  02/27/2019   Procedure: REMOVAL OF STONES;  Surgeon: [name], MD;  Location: [facility];  Service: Endoscopy;;  . Tammy Dillon  02/27/2019   Procedure: Tammy Dillon;  Surgeon: [name], MD;  Location: [facility];  Service: Endoscopy;;    There were no vitals filed for this visit.    Subjective Assessment - 12/29/19 1321    Subjective Pt had syncopal episode on 11-07-19, just after having received 2nd vaccine; pt fell straight back onto hardwood floor. EMS was called - pt was admitted to hospital - pacemaker was implanted  Pt states she was in garage this past Sunday, 12-27-19 and fell up against the  wall - skinned her Lt foream and thinks she hit her head - has had a setback since Sunday.   pt states she has a "light-headedness" in her head   Patient is accompained by: --   son-in-law   Patient Stated Goals resolve the vertigo and improve balance - "get back to the way I was"    Currently in Pain? No/denies              [facility] PT Assessment - 12/30/19 0001      Assessment   Medical Diagnosis BPPV    Referring Provider (PT) [name], MD    Onset Date/Surgical Date 11/07/19   2nd fall 12-27-19     Balance Screen   Has the patient fallen in the past 6 months Yes    How many times? 2    Has the patient had a decrease in activity level because of a fear of falling?  Yes    Is the patient reluctant to leave their home because of a fear of falling?  No      Prior Function   Level of Independence Independent      Observation/Other Assessments   Focus on Therapeutic Outcomes (FOTO)  pt's physical FS measure 45/100; risk adjusted 68/100     Other Surveys  Dizziness Handicap Inventory (DHI)    Dizziness Handicap Inventory (DHI)  70% (severe handicap group)  Vestibular Assessment - 12/30/19 0001      Symptom Behavior   Type of Dizziness  Unsteady with head/body turns;Lightheadedness;"Funny feeling in head"    Frequency of Dizziness occurs daily    Duration of Dizziness lasts secs to minutes    Symptom Nature Positional    Progression of Symptoms Better      Positional Testing   Dix-Hallpike Dix-Hallpike Right;Dix-Hallpike Left    Sidelying Test Sidelying Right;Sidelying Left    Horizontal Canal Testing Horizontal Canal Right;Horizontal Canal Left      Dix-Hallpike Right   Dix-Hallpike Right Duration approx. 12 secs    Dix-Hallpike Right Symptoms Left nystagmus      Dix-Hallpike Left   Dix-Hallpike Left Duration none    Dix-Hallpike Left Symptoms No nystagmus      Sidelying Right   Sidelying Right Duration approx. 10 secs      Sidelying  Left   Sidelying Left Duration none    Sidelying Left Symptoms No nystagmus      Horizontal Canal Right   Horizontal Canal Right Duration approx. 25 secs    Horizontal Canal Right Symptoms Geotrophic      Horizontal Canal Left   Horizontal Canal Left Duration none    Horizontal Canal Left Symptoms Normal              Objective measurements completed on examination: See above findings.               PT Education - 12/30/19 1719    Education Details instructed in rolling for habituation of Rt horizontal canal BPPV    Person(s) Educated Patient;Other (comment)   son-in-law   Methods Explanation;Demonstration;Handout    Comprehension Verbalized understanding;Returned demonstration               PT Long Term Goals - 12/30/19 1732      PT LONG TERM GOAL #1   Title Improve DHI score from 70% to </= 36% to demo improvement in vertigo.    Baseline 12-30-19 70%    Time 4    Period Weeks    Status New    Target Date 01/29/20      PT LONG TERM GOAL #2   Title Pt will have (-) Rt horizontal roll test to indicate resolution of Rt horizontal canal BPPV.    Time 4    Period Weeks    Status New    Target Date 01/29/20      PT LONG TERM GOAL #3   Title Pt will participate in balance testing after resolution of vertigo.    Time 4    Period Weeks    Status New    Target Date 01/29/20      PT LONG TERM GOAL #4   Title Pt will be independent in HEP for balance and habituation exercises.    Time 4    Period Weeks    Status New    Target Date 01/29/20                  Plan - 12/30/19 1722    Clinical Impression Statement Pt has (+) Rt horizontal roll test with Rt geotropic horizontal nystagmus, indicative of Rt horizontal canalithiasis.  Pt was treated with Rt Bar-B-Que roll 2 reps for canalith repositioning.  Symptoms improved on 2nd rep.  Will continue to assess and treat prn.    Personal Factors and Comorbidities Comorbidity 2;Age    Comorbidities  bradycardia, s/p pacemaker implant 11-07-19, vertigo  Examination-Activity Limitations Locomotion Level;Transfers;Bed Mobility;Bend;Squat;Reach Overhead;Hygiene/Grooming;Dressing    Examination-Participation Restrictions Meal Prep;Cleaning;Interpersonal Relationship;Laundry;Community Activity;Shop    Stability/Clinical Decision Making Evolving/Moderate complexity    Clinical Decision Making Moderate    Rehab Potential Good    PT Frequency 2x / week    PT Duration 4 weeks    PT Treatment/Interventions ADLs/Self Care Home Management;Vestibular;Canalith Repostioning;Therapeutic activities;Therapeutic exercise;Balance training;Neuromuscular re-education;Gait training;Stair training;Patient/family education    PT Next Visit Plan recheck Rt horizontal roll test, treat prn ; begin balance exercises    PT Home Exercise Plan repetitive rolling issued on 12-29-19    Consulted and Agree with Plan of Care Patient;Family member/caregiver    Family Member Consulted son-in-law           Patient will benefit from skilled therapeutic intervention in order to improve the following deficits and impairments:  Difficulty walking, Decreased balance, Dizziness  Visit Diagnosis: Difficulty in walking, not elsewhere classified - Plan: PT plan of care cert/re-cert  BPPV (benign paroxysmal positional vertigo), right - Plan: PT plan of care cert/re-cert  Unsteadiness on feet - Plan: PT plan of care cert/re-cert     Problem List Patient Active Problem List   Diagnosis Date Noted  . Bradycardia   . AV block, Mobitz II   . Syncope and collapse 11/08/2019    [name], PT 12/30/2019, 5:44 PM  [facility] [address] [place], [place], [address] Phone: [phone]   Fax:  [phone]  Name: SHANDEL BUSIC MRN: [id number] Date of Birth: 07/12/1929

## 2019-12-31 ENCOUNTER — Other Ambulatory Visit: Payer: Self-pay

## 2019-12-31 ENCOUNTER — Ambulatory Visit: Payer: Medicare Other | Admitting: Physical Therapy

## 2019-12-31 DIAGNOSIS — H8111 Benign paroxysmal vertigo, right ear: Secondary | ICD-10-CM | POA: Diagnosis not present

## 2019-12-31 DIAGNOSIS — R262 Difficulty in walking, not elsewhere classified: Secondary | ICD-10-CM | POA: Diagnosis not present

## 2019-12-31 DIAGNOSIS — H8113 Benign paroxysmal vertigo, bilateral: Secondary | ICD-10-CM

## 2019-12-31 DIAGNOSIS — R2681 Unsteadiness on feet: Secondary | ICD-10-CM | POA: Diagnosis not present

## 2020-01-01 ENCOUNTER — Encounter: Payer: Self-pay | Admitting: Physical Therapy

## 2020-01-01 NOTE — Patient Instructions (Signed)
Rolling    With pillow under head, start on back. Roll slowly to right. Hold position until symptoms subside. Roll slowly onto left side. Hold position until symptoms subside. Repeat sequence _5___ times per session. Do _3-5___ sessions per day.  Copyright  VHI. All rights reserved.   

## 2020-01-01 NOTE — Therapy (Signed)
[facility] [address] [place], [place], [address] Phone: [phone]   Fax:  [phone]  Physical Therapy Treatment  Patient Details  Name: Tammy Dillon MRN: [id number] Date of Birth: 01-13-1930 Referring Provider (PT): [name], MD   Encounter Date: 12/31/2019   PT End of Session - 01/01/20 1753    Visit Number 2    Number of Visits 8    Date for PT Re-Evaluation 01/29/20    Authorization Type Medicare    Authorization Time Period 12-29-19 - 03-18-20    PT Start Time 1450    PT Stop Time 1535    PT Time Calculation (min) 45 min    Activity Tolerance Patient tolerated treatment well    Behavior During Therapy [facility] for tasks assessed/performed           Past Medical History:  Diagnosis Date   Complication of anesthesia    Nausea/Vomiting   Hypertension     Past Surgical History:  Procedure Laterality Date   ERCP N/A 02/27/2019   Procedure: ENDOSCOPIC RETROGRADE CHOLANGIOPANCREATOGRAPHY (ERCP);  Surgeon: [name], MD;  Location: [facility];  Service: Endoscopy;  Laterality: N/A;   PACEMAKER IMPLANT N/A 11/09/2019   Procedure: PACEMAKER IMPLANT;  Surgeon: [name], MD;  Location: [place] [facility];  Service: Cardiovascular;  Laterality: N/A;   REMOVAL OF STONES  02/27/2019   Procedure: REMOVAL OF STONES;  Surgeon: [name], MD;  Location: [facility];  Service: Endoscopy;;   SPHINCTEROTOMY  02/27/2019   Procedure: [name];  Surgeon: [name], MD;  Location: [facility];  Service: Endoscopy;;    There were no vitals filed for this visit.   Subjective Assessment - 01/01/20 1747    Subjective Pt reports she did the rolling but says the vertigo is about the same - doesn't see much improvement   pt states she has a "light-headedness" in her head   Patient is accompained by: Family member   son-in-law   Patient Stated Goals resolve the vertigo and improve balance -  "get back to the way I was"    Currently in Pain? No/denies                   Vestibular Assessment - 01/01/20 0001      Positional Testing   Horizontal Canal Testing Horizontal Canal Right;Horizontal Canal Left      Horizontal Canal Right   Horizontal Canal Right Duration approx. 10 secs     Horizontal Canal Right Symptoms Normal;Ageotrophic      Horizontal Canal Left   Horizontal Canal Left Duration approx. 15 secs     Horizontal Canal Left Symptoms Geotrophic           Bar-b-que roll for Lt horizontal canal BPPV performed approx. 6 reps; 1 rep for Rt horizontal canal BPPV   Symptoms appeared to be slightly improved at end of session, but not fully resolved                 PT Education - 01/01/20 1751    Education Details instructed in rolling for habituation for Lt horizontal canal BPPV (Pt appears to have multi canal BPPV)    Person(s) Educated Patient;Other (comment)   son- in- law   Methods Explanation;Demonstration;Handout    Comprehension Verbalized understanding;Returned demonstration               PT Long Term Goals - 01/01/20 1758      PT LONG TERM GOAL #  1   Title Improve DHI score from 70% to </= 36% to demo improvement in vertigo.    Baseline 12-30-19 70%    Time 4    Period Weeks    Status New      PT LONG TERM GOAL #2   Title Pt will have (-) Rt horizontal roll test to indicate resolution of Rt horizontal canal BPPV.    Time 4    Period Weeks    Status New      PT LONG TERM GOAL #3   Title Pt will participate in balance testing after resolution of vertigo.    Time 4    Period Weeks    Status New      PT LONG TERM GOAL #4   Title Pt will be independent in HEP for balance and habituation exercises.    Time 4    Period Weeks    Status New                 Plan - 01/01/20 1754    Clinical Impression Statement Pt has (+) Lt horizontal roll test with Lt geotropic horizontal nystagmus and Rt ageotropic  horizontal nystagmus, indicative of Rt cupulolithiasis.  Symptoms appeared to be improved with less nystagmus noted after approx. 6 reps of Bar-B-Que roll for Lt horizontal canal BPPV.  Pt appears to have multicanal BPPV.  Continue with canalith repositioning for resolution.    Personal Factors and Comorbidities Comorbidity 2;Age    Comorbidities bradycardia, s/p pacemaker implant 11-07-19, vertigo    Examination-Activity Limitations Locomotion Level;Transfers;Bed Mobility;Bend;Squat;Reach Overhead;Hygiene/Grooming;Dressing    Examination-Participation Restrictions Meal Prep;Cleaning;Interpersonal Relationship;Laundry;Community Activity;Shop    Stability/Clinical Decision Making Evolving/Moderate complexity    Rehab Potential Good    PT Frequency 2x / week    PT Duration 4 weeks    PT Treatment/Interventions ADLs/Self Care Home Management;Vestibular;Canalith Repostioning;Therapeutic activities;Therapeutic exercise;Balance training;Neuromuscular re-education;Gait training;Stair training;Patient/family education    PT Next Visit Plan recheck Lt horizontal roll test, treat prn ; begin balance exercises    PT Home Exercise Plan repetitive rolling issued on 12-29-19    Consulted and Agree with Plan of Care Patient;Family member/caregiver    Family Member Consulted son-in-law           Patient will benefit from skilled therapeutic intervention in order to improve the following deficits and impairments:  Difficulty walking, Decreased balance, Dizziness  Visit Diagnosis: BPPV (benign paroxysmal positional vertigo), bilateral     Problem List Patient Active Problem List   Diagnosis Date Noted   Bradycardia    AV block, Mobitz II    Syncope and collapse 11/08/2019    [name], PT 01/01/2020, 5:59 PM  [facility] [address] [place], [place], [address] Phone: [phone]   Fax:  [phone]  Name: Tammy Dillon MRN: [id number] Date of Birth: [birth year]

## 2020-01-04 ENCOUNTER — Ambulatory Visit: Payer: Medicare Other | Admitting: Physical Therapy

## 2020-01-04 ENCOUNTER — Other Ambulatory Visit: Payer: Self-pay

## 2020-01-04 DIAGNOSIS — H8111 Benign paroxysmal vertigo, right ear: Secondary | ICD-10-CM | POA: Diagnosis not present

## 2020-01-04 DIAGNOSIS — R2681 Unsteadiness on feet: Secondary | ICD-10-CM | POA: Diagnosis not present

## 2020-01-04 DIAGNOSIS — R262 Difficulty in walking, not elsewhere classified: Secondary | ICD-10-CM | POA: Diagnosis not present

## 2020-01-04 DIAGNOSIS — H8113 Benign paroxysmal vertigo, bilateral: Secondary | ICD-10-CM | POA: Diagnosis not present

## 2020-01-05 ENCOUNTER — Encounter: Payer: Self-pay | Admitting: Physical Therapy

## 2020-01-05 ENCOUNTER — Ambulatory Visit: Payer: Medicare Other | Admitting: Physical Therapy

## 2020-01-05 DIAGNOSIS — H8113 Benign paroxysmal vertigo, bilateral: Secondary | ICD-10-CM

## 2020-01-05 DIAGNOSIS — R2681 Unsteadiness on feet: Secondary | ICD-10-CM | POA: Diagnosis not present

## 2020-01-05 DIAGNOSIS — H8111 Benign paroxysmal vertigo, right ear: Secondary | ICD-10-CM | POA: Diagnosis not present

## 2020-01-05 DIAGNOSIS — R262 Difficulty in walking, not elsewhere classified: Secondary | ICD-10-CM | POA: Diagnosis not present

## 2020-01-05 NOTE — Therapy (Signed)
[facility] [address] [place], [place], [address] Phone: [phone]   Fax:  [phone]  Physical Therapy Treatment  Patient Details  Name: NAELLE DIEGEL MRN: [id number] Date of Birth: [birth year] Referring Provider (PT): [name], MD   Encounter Date: 01/04/2020   PT End of Session - 01/05/20 2026    Visit Number 3    Number of Visits 8    Date for PT Re-Evaluation 01/29/20    Authorization Type Medicare    Authorization Time Period 12-29-19 - 03-18-20    PT Start Time 0849    PT Stop Time 0933    PT Time Calculation (min) 44 min    Activity Tolerance Patient tolerated treatment well    Behavior During Therapy [facility] for tasks assessed/performed           Past Medical History:  Diagnosis Date   Complication of anesthesia    Nausea/Vomiting   Hypertension     Past Surgical History:  Procedure Laterality Date   ERCP N/A 02/27/2019   Procedure: ENDOSCOPIC RETROGRADE CHOLANGIOPANCREATOGRAPHY (ERCP);  Surgeon: [name], MD;  Location: [facility];  Service: Endoscopy;  Laterality: N/A;   PACEMAKER IMPLANT N/A 11/09/2019   Procedure: PACEMAKER IMPLANT;  Surgeon: [name], MD;  Location: [place] [facility];  Service: Cardiovascular;  Laterality: N/A;   REMOVAL OF STONES  02/27/2019   Procedure: REMOVAL OF STONES;  Surgeon: [name], MD;  Location: [facility];  Service: Endoscopy;;   SPHINCTEROTOMY  02/27/2019   Procedure: [name];  Surgeon: [name], MD;  Location: [facility];  Service: Endoscopy;;    There were no vitals filed for this visit.   Subjective Assessment - 01/05/20 2014    Subjective Pt reports she woke up Sunday am around 1 am to go to bathroom and when she sat up she did not have vertigo (much improvement) but says she did have it this morning she got up   pt states she has a "light-headedness" in her head   Patient is accompained by: Family member    son-in-law   Patient Stated Goals resolve the vertigo and improve balance - "get back to the way I was"    Currently in Pain? No/denies                   Vestibular Assessment - 01/05/20 0001      Positional Testing   Horizontal Canal Testing Horizontal Canal Right;Horizontal Canal Left      Horizontal Canal Right   Horizontal Canal Right Duration approx. 30 secs    Horizontal Canal Right Symptoms Ageotrophic      Horizontal Canal Left   Horizontal Canal Left Duration approx. 10 secs    Horizontal Canal Left Symptoms Ageotrophic                     Vestibular Treatment/Exercise - 01/05/20 0001      Vestibular Treatment/Exercise   Vestibular Treatment Provided Canalith Repositioning    Habituation Exercises Horizontal Roll      Horizontal Roll   Number of Reps  4    Symptom Description  performed Bar-B-Que roll 4 reps for Lt horizontal BPPV - pt had Ageotropic horizontal nystagmus lasting approx. 1" 30 secs initially to 25 secs on 4 rep in Rt sidelying position                  PT Education - 01/05/20 2024    Education Details  instructed pt to continue rolling for habituation of Lt horizontal canal BPPV    Person(s) Educated Patient;Other (comment)   son in law   Methods Explanation    Comprehension Verbalized understanding               PT Long Term Goals - 01/05/20 2035      PT LONG TERM GOAL #1   Title Improve DHI score from 70% to </= 36% to demo improvement in vertigo.    Baseline 12-30-19 70%    Time 4    Period Weeks    Status New      PT LONG TERM GOAL #2   Title Pt will have (-) Rt horizontal roll test to indicate resolution of Rt horizontal canal BPPV.    Time 4    Period Weeks    Status New      PT LONG TERM GOAL #3   Title Pt will participate in balance testing after resolution of vertigo.    Time 4    Period Weeks    Status New      PT LONG TERM GOAL #4   Title Pt will be independent in HEP for balance and  habituation exercises.    Time 4    Period Weeks    Status New                 Plan - 01/05/20 2026    Clinical Impression Statement Pt had Rt ageotropic horizontal nystagmus, indicative of Rt horizontal cupulolithiasis in horizontal roll test.  Pt had minimal Lt ageotropic nystagmus in today's session with positional testing. Pt's Lt horizontal BPPV appears to be improved but now Rt horizontal canal BPPV is more prevalent.  Will continue to assess and treat multi-canal BPPV.    Personal Factors and Comorbidities Comorbidity 2;Age    Comorbidities bradycardia, s/p pacemaker implant 11-07-19, vertigo    Examination-Activity Limitations Locomotion Level;Transfers;Bed Mobility;Bend;Squat;Hygiene/Grooming;Dressing    Examination-Participation Restrictions Meal Prep;Cleaning;Interpersonal Relationship;Laundry;Community Activity;Shop    Stability/Clinical Decision Making Evolving/Moderate complexity    Rehab Potential Good    PT Frequency 2x / week    PT Duration 4 weeks    PT Treatment/Interventions ADLs/Self Care Home Management;Vestibular;Canalith Repostioning;Therapeutic activities;Therapeutic exercise;Balance training;Neuromuscular re-education;Gait training;Stair training;Patient/family education    PT Next Visit Plan recheck Lt horizontal roll test, treat prn ; begin balance exercises    PT Home Exercise Plan repetitive rolling issued on 12-29-19    Consulted and Agree with Plan of Care Patient;Family member/caregiver    Family Member Consulted son-in-law           Patient will benefit from skilled therapeutic intervention in order to improve the following deficits and impairments:  Difficulty walking, Decreased balance, Dizziness  Visit Diagnosis: BPPV (benign paroxysmal positional vertigo), bilateral     Problem List Patient Active Problem List   Diagnosis Date Noted   Bradycardia    AV block, Mobitz II    Syncope and collapse 11/08/2019    [name], PT 01/05/2020, 8:37 PM  [facility] [address] [place], [place], [address] Phone: [phone]   Fax:  [phone]  Name: ALIYANA DLUGOSZ MRN: [id number] Date of Birth: [birth year]

## 2020-01-06 NOTE — Therapy (Signed)
[facility] [address] [place], [place], [address] Phone: [phone]   Fax:  [phone]  Physical Therapy Treatment  Patient Details  Name: Tammy Dillon MRN: [id number] Date of Birth: [birth year] Referring Provider (PT): [name], MD   Encounter Date: 01/05/2020   PT End of Session - 01/06/20 1810    Visit Number 4    Number of Visits 8    Date for PT Re-Evaluation 01/29/20    Authorization Type Medicare    Authorization Time Period 12-29-19 - 03-18-20    PT Start Time 1405    PT Stop Time 1450    PT Time Calculation (min) 45 min    Activity Tolerance Patient tolerated treatment well    Behavior During Therapy [facility] for tasks assessed/performed           Past Medical History:  Diagnosis Date  . Complication of anesthesia    Nausea/Vomiting  . Hypertension     Past Surgical History:  Procedure Laterality Date  . ERCP N/A 02/27/2019   Procedure: ENDOSCOPIC RETROGRADE CHOLANGIOPANCREATOGRAPHY (ERCP);  Surgeon: [name], MD;  Location: [facility];  Service: Endoscopy;  Laterality: N/A;  . PACEMAKER IMPLANT N/A 11/09/2019   Procedure: PACEMAKER IMPLANT;  Surgeon: [name], MD;  Location: [place];  Service: Cardiovascular;  Laterality: N/A;  . REMOVAL OF STONES  02/27/2019   Procedure: REMOVAL OF STONES;  Surgeon: [name], MD;  Location: [facility];  Service: Endoscopy;;  . [name]  02/27/2019   Procedure: [name];  Surgeon: [name], MD;  Location: [facility];  Service: Endoscopy;;    There were no vitals filed for this visit.   Subjective Assessment - 01/06/20 1803    Subjective Pt states she continues to feel the vertigo in the morning when she wakes up - did some rolling yesterday pm   pt states she has a "light-headedness" in her head   Patient is accompained by: Family member   son-in-law   Patient Stated Goals resolve the vertigo and improve  balance - "get back to the way I was"    Currently in Pain? No/denies                   Vestibular Assessment - 01/06/20 0001      Positional Testing   Sidelying Test Sidelying Right;Sidelying Left    Horizontal Canal Testing Horizontal Canal Right;Horizontal Canal Left      Sidelying Right   Sidelying Right Duration approx. 28 secs    Sidelying Right Symptoms Left nystagmus      Sidelying Left   Sidelying Left Duration none    Sidelying Left Symptoms No nystagmus      Horizontal Canal Left   Horizontal Canal Left Duration approx. 50 secs on initial test    Horizontal Canal Left Symptoms Ageotrophic                    [facility] Adult PT Treatment/Exercise - 01/06/20 0001      Ambulation/Gait   Ambulation/Gait Yes    Ambulation/Gait Assistance 5: Supervision    Ambulation/Gait Assistance Details pt did not use walking stick    Ambulation Distance (Feet) 130 Feet    Assistive device None    Gait Pattern Within Functional Limits    Ambulation Surface Level;Indoor               Balance Exercises - 01/06/20 0001      Balance Exercises: Standing  Standing Eyes Opened Wide (BOA);Head turns;Solid surface;5 reps   horizontal and vertical   Standing Eyes Closed Narrow base of support (BOS);Head turns;Solid surface;5 reps             PT Education - 01/06/20 1809    Education Details instructed pt in modified Gufoni repositioning maneuver for habituation for Lt BPPV horizontal cupulolithiasis    Person(s) Educated Patient    Methods Explanation;Handout;Demonstration    Comprehension Verbalized understanding;Returned demonstration               PT Long Term Goals - 01/06/20 1816      PT LONG TERM GOAL #1   Title Improve DHI score from 70% to </= 36% to demo improvement in vertigo.    Baseline 12-30-19 70%    Time 4    Period Weeks    Status New      PT LONG TERM GOAL #2   Title Pt will have (-) Rt horizontal roll test to indicate  resolution of Rt horizontal canal BPPV.    Time 4    Period Weeks    Status New      PT LONG TERM GOAL #3   Title Pt will participate in balance testing after resolution of vertigo.    Time 4    Period Weeks    Status New      PT LONG TERM GOAL #4   Title Pt will be independent in HEP for balance and habituation exercises.    Time 4    Period Weeks    Status New                 Plan - 01/06/20 1811    Clinical Impression Statement Pt had much improvement in vertigo and less nystagmus provoked after approx. 6 reps of modified Gufoni maneuver for horizontal cupulolithiasis.  Pt stated she felt much better at end of session, indicating some resolution of Lt horizontal canal BPPV.  Pt able to amb. around track in gym without walking stick without LOB, with SBA only.  Cont toward LTG's.    Personal Factors and Comorbidities Comorbidity 2;Age    Comorbidities bradycardia, s/p pacemaker implant 11-07-19, vertigo    Examination-Activity Limitations Locomotion Level;Transfers;Bed Mobility;Bend;Squat;Hygiene/Grooming;Dressing    Examination-Participation Restrictions Meal Prep;Cleaning;Interpersonal Relationship;Laundry;Community Activity;Shop    Stability/Clinical Decision Making Evolving/Moderate complexity    Rehab Potential Good    PT Frequency 2x / week    PT Duration 4 weeks    PT Treatment/Interventions ADLs/Self Care Home Management;Vestibular;Canalith Repostioning;Therapeutic activities;Therapeutic exercise;Balance training;Neuromuscular re-education;Gait training;Stair training;Patient/family education    PT Next Visit Plan recheck Lt horizontal roll test, treat prn ; begin balance exercises    PT Home Exercise Plan repetitive rolling issued on 12-29-19    Consulted and Agree with Plan of Care Patient;Family member/caregiver    Family Member Consulted son-in-law           Patient will benefit from skilled therapeutic intervention in order to improve the following deficits  and impairments:  Difficulty walking, Decreased balance, Dizziness  Visit Diagnosis: BPPV (benign paroxysmal positional vertigo), bilateral     Problem List Patient Active Problem List   Diagnosis Date Noted  . Bradycardia   . AV block, Mobitz II   . Syncope and collapse 11/08/2019    Stiles Maxcy, [place], PT 01/06/2020, 6:18 PM  [facility] [address] [place], [place], [address] Phone: [phone]   Fax:  [phone]  Name: Tammy Dillon MRN:  [id number] Date of Birth: Aug 05, 1929

## 2020-01-07 ENCOUNTER — Other Ambulatory Visit: Payer: Self-pay

## 2020-01-07 ENCOUNTER — Ambulatory Visit: Payer: Medicare Other | Admitting: Physical Therapy

## 2020-01-07 DIAGNOSIS — H8113 Benign paroxysmal vertigo, bilateral: Secondary | ICD-10-CM | POA: Diagnosis not present

## 2020-01-07 DIAGNOSIS — R2681 Unsteadiness on feet: Secondary | ICD-10-CM | POA: Diagnosis not present

## 2020-01-07 DIAGNOSIS — R262 Difficulty in walking, not elsewhere classified: Secondary | ICD-10-CM | POA: Diagnosis not present

## 2020-01-07 DIAGNOSIS — H8111 Benign paroxysmal vertigo, right ear: Secondary | ICD-10-CM | POA: Diagnosis not present

## 2020-01-08 ENCOUNTER — Encounter: Payer: Self-pay | Admitting: Physical Therapy

## 2020-01-08 NOTE — Therapy (Signed)
[facility] [address] [place], [place], [address] Phone: [phone]   Fax:  [phone]  Physical Therapy Evaluation  Patient Details  Name: Tammy Dillon MRN: [id number] Date of Birth: 1929/04/29 Referring Provider (PT): [name], MD   Encounter Date: 01/07/2020   PT End of Session - 01/08/20 1231    Visit Number 5    Number of Visits 8    Date for PT Re-Evaluation 01/29/20    Authorization Type Medicare    Authorization Time Period 12-29-19 - 03-18-20    PT Start Time 0801    PT Stop Time 0850    PT Time Calculation (min) 49 min    Activity Tolerance Patient tolerated treatment well    Behavior During Therapy [facility] for tasks assessed/performed           Past Medical History:  Diagnosis Date  . Complication of anesthesia    Nausea/Vomiting  . Hypertension     Past Surgical History:  Procedure Laterality Date  . ERCP N/A 02/27/2019   Procedure: ENDOSCOPIC RETROGRADE CHOLANGIOPANCREATOGRAPHY (ERCP);  Surgeon: [name], MD;  Location: [facility];  Service: Endoscopy;  Laterality: N/A;  . PACEMAKER IMPLANT N/A 11/09/2019   Procedure: PACEMAKER IMPLANT;  Surgeon: [name], MD;  Location: [facility];  Service: Cardiovascular;  Laterality: N/A;  . REMOVAL OF STONES  02/27/2019   Procedure: REMOVAL OF STONES;  Surgeon: [name], MD;  Location: [facility];  Service: Endoscopy;;  . [name]  02/27/2019   Procedure: [name];  Surgeon: [name], MD;  Location: [facility];  Service: Endoscopy;;    There were no vitals filed for this visit.    Subjective Assessment - 01/07/20 0804    Subjective Pt states she continues to feel the vertigo in the morning when she wakes up - did some rolling yesterday pm; pt accompanied by her daughter, Jeannene Patella, to today's session   pt states she has a "light-headedness" in her head   Patient is accompained by: Family member    son-in-law   Patient Stated Goals resolve the vertigo and improve balance - "get back to the way I was"    Currently in Pain? No/denies                      Vestibular Assessment - 01/08/20 0001      Positional Testing   Dix-Hallpike Dix-Hallpike Right;Dix-Hallpike Left    Sidelying Test Sidelying Right;Sidelying Left    Horizontal Canal Testing Horizontal Canal Right;Horizontal Canal Left      Dix-Hallpike Right   Dix-Hallpike Right Duration none    Dix-Hallpike Right Symptoms No nystagmus      Dix-Hallpike Left   Dix-Hallpike Left Duration approx. 12 secs    Dix-Hallpike Left Symptoms Upbeat, left rotatory nystagmus      Sidelying Right   Sidelying Right Duration none    Sidelying Right Symptoms No nystagmus      Sidelying Left   Sidelying Left Duration approx. 8 secs    Sidelying Left Symptoms Left nystagmus      Horizontal Canal Right   Horizontal Canal Right Duration none    Horizontal Canal Right Symptoms Normal      Horizontal Canal Left   Horizontal Canal Left Duration none    Horizontal Canal Left Symptoms Normal              Objective measurements completed on examination: See above findings.     Pt  was treated with Epley maneuver for Lt posterior canalithiasis - BPPV appeared to be resolved on 3rd rep with no nystagmus noted and no vertigo reported in any position of maneuver       Balance Exercises - 01/08/20 0001      Balance Exercises: Standing   Standing Eyes Opened Wide (BOA);Head turns;Foam/compliant surface;5 reps;Solid surface    Standing Eyes Closed Wide (BOA);Head turns;Foam/compliant surface;Solid surface    Marching Solid surface;Static;Head turns;5 reps   head turns horizontal & vertical            PT Education - 01/08/20 1222    Education Details HEP - Medbridge [id number] - standing on floor with head turns EO and EC, standing on pillows with EO and EC, marching on floor - then adding head turns    Person(s)  Educated Patient;Child(ren)    Methods Explanation;Demonstration;Handout    Comprehension Verbalized understanding;Returned demonstration               PT Long Term Goals - 01/06/20 1816      PT LONG TERM GOAL #1   Title Improve DHI score from 70% to </= 36% to demo improvement in vertigo.    Baseline 12-30-19 70%    Time 4    Period Weeks    Status New      PT LONG TERM GOAL #2   Title Pt will have (-) Rt horizontal roll test to indicate resolution of Rt horizontal canal BPPV.    Time 4    Period Weeks    Status New      PT LONG TERM GOAL #3   Title Pt will participate in balance testing after resolution of vertigo.    Time 4    Period Weeks    Status New      PT LONG TERM GOAL #4   Title Pt will be independent in HEP for balance and habituation exercises.    Time 4    Period Weeks    Status New                  Plan - 01/08/20 1231    Clinical Impression Statement Pt had (+) Lt Dix-Hallpike test with Lt rotary upbeating nystagmus and c/o vertigo in test position, indicative of Lt posterior canalithiasis.  Pt had (-) Lt horizontal roll test, indicative of resolution of Lt horizontal canal BPPV.  Pt reports she feels unsteady and off balance since onset of vertigo in August; pt was instructed in HEP for balance/vestibular exercises with education provided to both pt & daughter with this HEP.  Pt demonstrating mild unsteadiness with gait at end of session, but reported feeling better overall than at start of session with less vertigo reported.    Personal Factors and Comorbidities Comorbidity 2;Age    Comorbidities bradycardia, s/p pacemaker implant 11-07-19, vertigo    Examination-Activity Limitations Locomotion Level;Transfers;Bed Mobility;Bend;Squat;Hygiene/Grooming;Dressing    Examination-Participation Restrictions Meal Prep;Cleaning;Interpersonal Relationship;Laundry;Community Activity;Shop    Stability/Clinical Decision Making Evolving/Moderate complexity     Rehab Potential Good    PT Frequency 2x / week    PT Duration 4 weeks    PT Treatment/Interventions ADLs/Self Care Home Management;Vestibular;Canalith Repostioning;Therapeutic activities;Therapeutic exercise;Balance training;Neuromuscular re-education;Gait training;Stair training;Patient/family education    PT Next Visit Plan recheck Lt Dix-Hallpike -  treat prn; check  balance exercises issued for HEP    PT Home Exercise Plan repetitive rolling issued on 12-29-19; Medbridge [id number]    Consulted and Agree with Plan of Care Patient;Family member/caregiver  Family Member Consulted daughter [name]           Patient will benefit from skilled therapeutic intervention in order to improve the following deficits and impairments:  Difficulty walking, Decreased balance, Dizziness  Visit Diagnosis: BPPV (benign paroxysmal positional vertigo), bilateral - Plan: PT plan of care cert/re-cert  Unsteadiness on feet - Plan: PT plan of care cert/re-cert     Problem List Patient Active Problem List   Diagnosis Date Noted  . Bradycardia   . AV block, Mobitz II   . Syncope and collapse 11/08/2019    [name], PT 01/08/2020, 12:47 PM  [facility] [address] [place], [place], [address] Phone: [phone]   Fax:  [phone]  Name: TAINA LANDRY MRN: [id number] Date of Birth: October 28, 1929

## 2020-01-11 ENCOUNTER — Ambulatory Visit: Payer: Medicare Other | Admitting: Physical Therapy

## 2020-01-11 ENCOUNTER — Encounter: Payer: Self-pay | Admitting: Physical Therapy

## 2020-01-11 ENCOUNTER — Other Ambulatory Visit: Payer: Self-pay

## 2020-01-11 DIAGNOSIS — R2681 Unsteadiness on feet: Secondary | ICD-10-CM | POA: Diagnosis not present

## 2020-01-11 DIAGNOSIS — H8111 Benign paroxysmal vertigo, right ear: Secondary | ICD-10-CM | POA: Diagnosis not present

## 2020-01-11 DIAGNOSIS — H8113 Benign paroxysmal vertigo, bilateral: Secondary | ICD-10-CM | POA: Diagnosis not present

## 2020-01-11 DIAGNOSIS — R262 Difficulty in walking, not elsewhere classified: Secondary | ICD-10-CM | POA: Diagnosis not present

## 2020-01-11 NOTE — Patient Instructions (Signed)
HIP: Flexion / KNEE: Extension, Heel Strike - Standing    Reach leg out to take step, knee straight. Land with heel on floor, toes up. __10_ reps per set, _1__ sets per day, _5__ days per week Hold onto a support.   Also do BACKWARD AND SIDE LIFTS - ALTERNATE LEGS - 10 REPS EACH LEG - HOLD ONTO COUNTER FOR SAFETY     Sit to Side-Lying    Sit on edge of bed. 1. Turn head 45 to right. 2. Maintain head position and lie down slowly on left side. Hold until symptoms subside. 3. Sit up slowly. Hold until symptoms subside. 4. Turn head 45 to left. 5. Maintain head position and lie down slowly on right side. Hold until symptoms subside. 6. Sit up slowly. Repeat sequence __5__ times per session. Do __1__ sessions per day.  Copyright  VHI. All rights reserved.     SIT TO STAND: Feet in Modified Tandem    Place one foot in front of the other. Lean chest forward. Raise hips and straighten knees to stand. __5-10_ reps per set, _1__ sets per day, _5__ days per week Hold onto a support. Repeat with other leg.  Copyright  VHI. All rights reserved.

## 2020-01-12 ENCOUNTER — Encounter: Payer: Self-pay | Admitting: Physical Therapy

## 2020-01-12 NOTE — Therapy (Signed)
[facility] [address] [place], [place], [address] Phone: [phone]   Fax:  [phone]  Physical Therapy Treatment  Patient Details  Name: Tammy Dillon MRN: [id number] Date of Birth: 20-Oct-1929 Referring Provider (PT): [name], MD   Encounter Date: 01/11/2020   PT End of Session - 01/12/20 2007    Visit Number 6    Number of Visits 8    Date for PT Re-Evaluation 01/29/20    Authorization Type Medicare    Authorization Time Period 12-29-19 - 03-18-20    PT Start Time 0847    PT Stop Time 0930    PT Time Calculation (min) 43 min    Activity Tolerance Patient tolerated treatment well    Behavior During Therapy [facility] for tasks assessed/performed           Past Medical History:  Diagnosis Date   Complication of anesthesia    Nausea/Vomiting   Hypertension     Past Surgical History:  Procedure Laterality Date   ERCP N/A 02/27/2019   Procedure: ENDOSCOPIC RETROGRADE CHOLANGIOPANCREATOGRAPHY (ERCP);  Surgeon: [name], MD;  Location: [facility];  Service: Endoscopy;  Laterality: N/A;   PACEMAKER IMPLANT N/A 11/09/2019   Procedure: PACEMAKER IMPLANT;  Surgeon: [name], MD;  Location: [facility];  Service: Cardiovascular;  Laterality: N/A;   REMOVAL OF STONES  02/27/2019   Procedure: REMOVAL OF STONES;  Surgeon: [name], MD;  Location: [facility];  Service: Endoscopy;;   SPHINCTEROTOMY  02/27/2019   Procedure: [name];  Surgeon: [name], MD;  Location: [facility];  Service: Endoscopy;;    There were no vitals filed for this visit.            Vestibular Assessment - 01/12/20 0001      Positional Testing   Dix-Hallpike Dix-Hallpike Right    Sidelying Test Sidelying Right;Sidelying Left    Horizontal Canal Testing Horizontal Canal Right;Horizontal Canal Left      Dix-Hallpike Right   Dix-Hallpike Right Duration none    Dix-Hallpike Right  Symptoms No nystagmus      Dix-Hallpike Left   Dix-Hallpike Left Duration none    Dix-Hallpike Left Symptoms No nystagmus      Sidelying Right   Sidelying Right Duration none    Sidelying Right Symptoms No nystagmus      Sidelying Left   Sidelying Left Duration none    Sidelying Left Symptoms Left nystagmus      Horizontal Canal Right   Horizontal Canal Right Duration none    Horizontal Canal Right Symptoms Normal      Horizontal Canal Left   Horizontal Canal Left Duration none    Horizontal Canal Left Symptoms Normal                Habituation exercise - pt performed sit to Rt sidelying to sitting 5 reps; balance and stability improved on reps 4 and 5          Balance Exercises - 01/12/20 0001      Balance Exercises: Standing   Standing Eyes Opened Wide (BOA);Head turns;Foam/compliant surface;5 reps    Standing Eyes Closed Wide (BOA);Head turns;Foam/compliant surface;5 reps   horizontal & vertical head turns   Marching Foam/compliant surface;Static;Head turns   CGA to min assist with balance   Other Standing Exercises forward, back and side kicks     Other Standing Exercises Comments sit to stand with UE support - feet in partial tandem stance but shoulder  width apart              PT Education - 01/12/20 2005    Education Details instructed in Brandt-Daroff exercises for HEP; sit to stand; forward, back and side kicks with UE support at counter    Person(s) Educated Patient    Methods Explanation;Handout;Demonstration    Comprehension Verbalized understanding;Returned demonstration               PT Long Term Goals - 01/12/20 2013      PT LONG TERM GOAL #1   Title Improve DHI score from 70% to </= 36% to demo improvement in vertigo.    Baseline 12-30-19 70%    Time 4    Period Weeks    Status New      PT LONG TERM GOAL #2   Title Pt will have (-) Rt horizontal roll test to indicate resolution of Rt horizontal canal BPPV.    Time 4     Period Weeks    Status New      PT LONG TERM GOAL #3   Title Pt will participate in balance testing after resolution of vertigo.    Time 4    Period Weeks    Status New      PT LONG TERM GOAL #4   Title Pt will be independent in HEP for balance and habituation exercises.    Time 4    Period Weeks    Status New                 Plan - 01/12/20 2008    Clinical Impression Statement Pt has no nystagmus and no c/o vertigo with any positional testing, indicative of resolution of Lt BPPV - horizontal and posterior canalithiasis.  Pt does report imbalance/dysequilibrium with Rt sidelying to sitting transfer, but stability improved on reps 4 and 5 for habituation.  Pt is very fearful of falling with standing exercises on compliant surface, but only min assist needed for recovery of LOB.  Cont toward POC.    Personal Factors and Comorbidities Comorbidity 2;Age    Comorbidities bradycardia, s/p pacemaker implant 11-07-19, vertigo    Examination-Activity Limitations Locomotion Level;Transfers;Bed Mobility;Bend;Squat;Hygiene/Grooming;Dressing    Examination-Participation Restrictions Meal Prep;Cleaning;Interpersonal Relationship;Laundry;Community Activity;Shop    Stability/Clinical Decision Making Evolving/Moderate complexity    Rehab Potential Good    PT Frequency 2x / week    PT Duration 4 weeks    PT Treatment/Interventions ADLs/Self Care Home Management;Vestibular;Canalith Repostioning;Therapeutic activities;Therapeutic exercise;Balance training;Neuromuscular re-education;Gait training;Stair training;Patient/family education    PT Next Visit Plan check balance exercises issued for HEP - cont balance on foam    PT Home Exercise Plan repetitive rolling issued on 12-29-19; Medbridge [id number]    Consulted and Agree with Plan of Care Patient;Family member/caregiver    Family Member Consulted daughter [name]           Patient will benefit from skilled therapeutic intervention in order to  improve the following deficits and impairments:  Difficulty walking, Decreased balance, Dizziness  Visit Diagnosis: BPPV (benign paroxysmal positional vertigo), bilateral  Unsteadiness on feet     Problem List Patient Active Problem List   Diagnosis Date Noted   Bradycardia    AV block, Mobitz II    Syncope and collapse 11/08/2019    [name], PT 01/12/2020, 8:15 PM  [facility] [address] [place], [place], [address] Phone: [phone]   Fax:  [phone]  Name: Tammy Dillon MRN: [id number] Date  of Birth: Dec 28, 1929

## 2020-01-14 ENCOUNTER — Encounter: Payer: Self-pay | Admitting: Physical Therapy

## 2020-01-14 ENCOUNTER — Ambulatory Visit: Payer: Medicare Other | Admitting: Physical Therapy

## 2020-01-14 ENCOUNTER — Other Ambulatory Visit: Payer: Self-pay

## 2020-01-14 DIAGNOSIS — H8111 Benign paroxysmal vertigo, right ear: Secondary | ICD-10-CM | POA: Diagnosis not present

## 2020-01-14 DIAGNOSIS — H8113 Benign paroxysmal vertigo, bilateral: Secondary | ICD-10-CM | POA: Diagnosis not present

## 2020-01-14 DIAGNOSIS — R262 Difficulty in walking, not elsewhere classified: Secondary | ICD-10-CM | POA: Diagnosis not present

## 2020-01-14 DIAGNOSIS — R2681 Unsteadiness on feet: Secondary | ICD-10-CM

## 2020-01-15 NOTE — Therapy (Signed)
[facility] [address] [place], [place], [address] Phone: [phone]   Fax:  [phone]  Physical Therapy Treatment  Patient Details  Name: Tammy Dillon MRN: [id number] Date of Birth: [birth year] Referring Provider (PT): [name], MD   Encounter Date: 01/14/2020   PT End of Session - 01/15/20 1826    Visit Number 7    Number of Visits 8    Date for PT Re-Evaluation 01/29/20    Authorization Type Medicare    Authorization Time Period 12-29-19 - 03-18-20    PT Start Time 1056    PT Stop Time 1140    PT Time Calculation (min) 44 min    Equipment Utilized During Treatment Gait belt    Activity Tolerance Patient tolerated treatment well    Behavior During Therapy [facility] for tasks assessed/performed           Past Medical History:  Diagnosis Date  . Complication of anesthesia    Nausea/Vomiting  . Hypertension     Past Surgical History:  Procedure Laterality Date  . ERCP N/A 02/27/2019   Procedure: ENDOSCOPIC RETROGRADE CHOLANGIOPANCREATOGRAPHY (ERCP);  Surgeon: [name], MD;  Location: [facility];  Service: Endoscopy;  Laterality: N/A;  . PACEMAKER IMPLANT N/A 11/09/2019   Procedure: PACEMAKER IMPLANT;  Surgeon: [name], MD;  Location: [facility];  Service: Cardiovascular;  Laterality: N/A;  . REMOVAL OF STONES  02/27/2019   Procedure: REMOVAL OF STONES;  Surgeon: [name], MD;  Location: [facility];  Service: Endoscopy;;  . Tammy Dillon  02/27/2019   Procedure: Tammy Dillon;  Surgeon: [name], MD;  Location: [facility];  Service: Endoscopy;;    There were no vitals filed for this visit.   Subjective Assessment - 01/15/20 1820    Subjective Pt states she feels that she is going to lose her balance and fall backward; pt amb. with walking stick - reports doing balance exercises at home   pt states she has a "light-headedness" in her head   Patient is accompained  by: Family member   son-in-law - Todd   Patient Stated Goals resolve the vertigo and improve balance - "get back to the way I was"    Currently in Pain? No/denies                             [facility] Adult PT Treatment/Exercise - 01/15/20 0001      Ambulation/Gait   Ambulation/Gait Yes    Ambulation/Gait Assistance 4: Min guard    Ambulation Distance (Feet) 100 Feet    Assistive device None    Gait Pattern Within Functional Limits    Ambulation Surface Level;Indoor    Stairs Yes    Stair Management Technique One rail Right;Forwards;Step to pattern    Number of Stairs 8   4 steps x 2 reps   Height of Stairs 6               Balance Exercises - 01/15/20 0001      Balance Exercises: Standing   Standing Eyes Opened Wide (BOA);Head turns;Foam/compliant surface;5 reps   horizontal and vertical head turns   Standing Eyes Closed Wide (BOA);Head turns;Foam/compliant surface;Solid surface;5 reps   5 reps each - horizontal and vertical - on floor & pillows   Wall Bumps Hip;10 reps    Rockerboard Anterior/posterior;EO;10 reps   with CGA to min assist   Marching Solid surface;Static;Head turns;5 reps  head turns horizontal & vertical   Other Standing Exercises forward, back and side kicks     Other Standing Exercises Comments sit to stand with UE support - feet in partial tandem stance but shoulder width apart                   PT Long Term Goals - 01/15/20 1834      PT LONG TERM GOAL #1   Title Improve DHI score from 70% to </= 36% to demo improvement in vertigo.    Baseline 12-30-19 70%    Time 4    Period Weeks    Status New      PT LONG TERM GOAL #2   Title Pt will have (-) Rt horizontal roll test to indicate resolution of Rt horizontal canal BPPV.    Time 4    Period Weeks    Status New      PT LONG TERM GOAL #3   Title Pt will participate in balance testing after resolution of vertigo.    Time 4    Period Weeks    Status New      PT LONG  TERM GOAL #4   Title Pt will be independent in HEP for balance and habituation exercises.    Time 4    Period Weeks    Status New                 Plan - 01/15/20 1829    Clinical Impression Statement Skilled session focused on balance retraining on compliant and noncompliant surfaces.  Pt did well with dynamic balance and gait activity of walking and tossing ball with no LOB occurring.  Pt continues to have difficulty maintaining balance on compliant surface with head turns and also with EC, indicative of decreased vestibular input in maintaining balance.  Steadiness with ambulation on flat even surface is improving - pt remains cautious and fearful of falling.  Pt is progressing well towards goals.    Personal Factors and Comorbidities Comorbidity 2;Age    Comorbidities bradycardia, s/p pacemaker implant 11-07-19, vertigo    Examination-Activity Limitations Locomotion Level;Transfers;Bed Mobility;Bend;Squat;Hygiene/Grooming;Dressing    Examination-Participation Restrictions Meal Prep;Cleaning;Interpersonal Relationship;Laundry;Community Activity;Shop    Stability/Clinical Decision Making Evolving/Moderate complexity    Rehab Potential Good    PT Frequency 2x / week    PT Duration 4 weeks    PT Treatment/Interventions ADLs/Self Care Home Management;Vestibular;Canalith Repostioning;Therapeutic activities;Therapeutic exercise;Balance training;Neuromuscular re-education;Gait training;Stair training;Patient/family education    PT Next Visit Plan check balance exercises issued for HEP - cont balance on foam    PT Home Exercise Plan repetitive rolling issued on 12-29-19; Medbridge [id number]    Consulted and Agree with Plan of Care Patient;Family member/caregiver    Family Member Consulted daughter [name]           Patient will benefit from skilled therapeutic intervention in order to improve the following deficits and impairments:  Difficulty walking, Decreased balance, Dizziness  Visit  Diagnosis: Unsteadiness on feet  Difficulty in walking, not elsewhere classified     Problem List Patient Active Problem List   Diagnosis Date Noted  . Bradycardia   . AV block, Mobitz II   . Syncope and collapse 11/08/2019    Tammy Dillon, PT 01/15/2020, 6:35 PM  [facility] [address] [place], [place], [address] Phone: [phone]   Fax:  [phone]  Name: TANGANYIKA BOWLDS MRN: [id number] Date of Birth: Dec 08, 1929

## 2020-01-19 ENCOUNTER — Other Ambulatory Visit: Payer: Self-pay

## 2020-01-19 ENCOUNTER — Ambulatory Visit: Payer: Medicare Other | Attending: Internal Medicine | Admitting: Physical Therapy

## 2020-01-19 ENCOUNTER — Encounter: Payer: Self-pay | Admitting: Physical Therapy

## 2020-01-19 DIAGNOSIS — R262 Difficulty in walking, not elsewhere classified: Secondary | ICD-10-CM | POA: Insufficient documentation

## 2020-01-19 DIAGNOSIS — R2681 Unsteadiness on feet: Secondary | ICD-10-CM | POA: Insufficient documentation

## 2020-01-20 ENCOUNTER — Encounter: Payer: Self-pay | Admitting: Physical Therapy

## 2020-01-20 NOTE — Therapy (Signed)
[facility] [address] [place], [place], [address] Phone: [phone]   Fax:  [phone]  Physical Therapy Treatment  Patient Details  Name: Tammy Dillon MRN: [id number] Date of Birth: 1929/05/12 Referring Provider (PT): [name], MD   Encounter Date: 01/19/2020   PT End of Session - 01/20/20 1512    Visit Number 8    Number of Visits 8    Date for PT Re-Evaluation 01/29/20    Authorization Type Medicare    Authorization Time Period 12-29-19 - 03-18-20    PT Start Time 0850    PT Stop Time 0932    PT Time Calculation (min) 42 min    Equipment Utilized During Treatment Gait belt    Activity Tolerance Patient tolerated treatment well    Behavior During Therapy [facility] for tasks assessed/performed           Past Medical History:  Diagnosis Date   Complication of anesthesia    Nausea/Vomiting   Hypertension     Past Surgical History:  Procedure Laterality Date   ERCP N/A 02/27/2019   Procedure: ENDOSCOPIC RETROGRADE CHOLANGIOPANCREATOGRAPHY (ERCP);  Surgeon: [name], MD;  Location: [facility];  Service: Endoscopy;  Laterality: N/A;   PACEMAKER IMPLANT N/A 11/09/2019   Procedure: PACEMAKER IMPLANT;  Surgeon: [name], MD;  Location: [facility];  Service: Cardiovascular;  Laterality: N/A;   REMOVAL OF STONES  02/27/2019   Procedure: REMOVAL OF STONES;  Surgeon: [name], MD;  Location: [facility];  Service: Endoscopy;;   SPHINCTEROTOMY  02/27/2019   Procedure: [name];  Surgeon: [name], MD;  Location: [facility];  Service: Endoscopy;;    There were no vitals filed for this visit.   Subjective Assessment - 01/19/20 0855    Subjective Pt states she feels balance is a little better, but still feels she is going to fall backwards at times - states she did take a shower this weekend with daughter standing close by   pt states she has a "light-headedness" in her  head   Patient is accompained by: Family member   son-in-law - Todd   Patient Stated Goals resolve the vertigo and improve balance - "get back to the way I was"    Currently in Pain? No/denies                             [facility] Adult PT Treatment/Exercise - 01/20/20 0001      Ambulation/Gait   Ambulation/Gait Yes    Ambulation/Gait Assistance 4: Min guard    Ambulation Distance (Feet) 400 Feet    Assistive device None    Gait Pattern Within Functional Limits    Ambulation Surface Level;Unlevel;Indoor;Outdoor;Paved    Stairs Yes    Stair Management Technique One rail Right;Step to pattern;Alternating pattern   alternating with ascension; step to w/descension   Number of Stairs 12   4 steps - 3 reps   Height of Stairs 6          Pt gait trained outside on pavement - on incline/decline in parking lot - no device used -CGA for safety  Pt negotiated curb without device with CGA - pt had no LOB with curb negotiation  Discussed foot wear with pt and her son-in-law; pt was recommended to wear closed heel shoes for safety - not the slip on shoes She is currently wearing     Balance Exercises - 01/20/20  0001      Balance Exercises: Standing   Rockerboard Anterior/posterior;EO;10 reps   with CGA to min assist   Marching Solid surface;Static;Dynamic   15' straight ahead (dynamic) with min assist   Other Standing Exercises pt performed amb. carrying 5# weight in plastic container 115' - no LOB    Other Standing Exercises Comments performed amb. tossing and catching ball - straight up and then Rt/Lt sides with min guard for safety;  pt performed backwards amb. tossing ball approx. 20' with min to mod assist for recovery of posterior LOB                   PT Long Term Goals - 01/20/20 1515      PT LONG TERM GOAL #1   Title Improve DHI score from 70% to </= 36% to demo improvement in vertigo.    Baseline 12-30-19 70%    Time 4    Period Weeks    Status New        PT LONG TERM GOAL #2   Title Pt will have (-) Rt horizontal roll test to indicate resolution of Rt horizontal canal BPPV.    Time 4    Period Weeks    Status New      PT LONG TERM GOAL #3   Title Pt will participate in balance testing after resolution of vertigo.    Time 4    Period Weeks    Status New      PT LONG TERM GOAL #4   Title Pt will be independent in HEP for balance and habituation exercises.    Time 4    Period Weeks    Status New                 Plan - 01/20/20 1513    Clinical Impression Statement Pt had occasional LOB posteriorly with dynamic gait activities - required min assist for recovery, which occurred with amb. backwards.  Pt has decreased SLS on each leg and needs minimal UE support for safety with SLS activities.  Cont with POC.    Personal Factors and Comorbidities Comorbidity 2;Age    Comorbidities bradycardia, s/p pacemaker implant 11-07-19, vertigo    Examination-Activity Limitations Locomotion Level;Transfers;Bed Mobility;Bend;Squat;Hygiene/Grooming;Dressing    Examination-Participation Restrictions Meal Prep;Cleaning;Interpersonal Relationship;Laundry;Community Activity;Shop    Stability/Clinical Decision Making Evolving/Moderate complexity    Rehab Potential Good    PT Frequency 2x / week    PT Duration 4 weeks    PT Treatment/Interventions ADLs/Self Care Home Management;Vestibular;Canalith Repostioning;Therapeutic activities;Therapeutic exercise;Balance training;Neuromuscular re-education;Gait training;Stair training;Patient/family education    PT Next Visit Plan check balance exercises issued for HEP - cont balance on foam    PT Home Exercise Plan repetitive rolling issued on 12-29-19; Medbridge [id number]    Consulted and Agree with Plan of Care Patient;Family member/caregiver    Family Member Consulted daughter [name]           Patient will benefit from skilled therapeutic intervention in order to improve the following deficits and  impairments:  Difficulty walking, Decreased balance, Dizziness  Visit Diagnosis: Unsteadiness on feet  Difficulty in walking, not elsewhere classified     Problem List Patient Active Problem List   Diagnosis Date Noted   Bradycardia    AV block, Mobitz II    Syncope and collapse 11/08/2019    [name], PT 01/20/2020, 3:16 PM  [facility] [address] [place], [place], [address] Phone: [phone]  Fax:  [phone]  Name: Tammy Dillon MRN: [id number] Date of Birth: November 19, 1929

## 2020-01-21 ENCOUNTER — Ambulatory Visit: Payer: Medicare Other | Admitting: Physical Therapy

## 2020-01-21 ENCOUNTER — Other Ambulatory Visit: Payer: Self-pay

## 2020-01-21 DIAGNOSIS — R2681 Unsteadiness on feet: Secondary | ICD-10-CM | POA: Diagnosis not present

## 2020-01-21 DIAGNOSIS — R262 Difficulty in walking, not elsewhere classified: Secondary | ICD-10-CM | POA: Diagnosis not present

## 2020-01-22 ENCOUNTER — Encounter: Payer: Self-pay | Admitting: Physical Therapy

## 2020-01-22 NOTE — Therapy (Signed)
[facility] [address] [place], [place], [address] Phone: [phone]   Fax:  [phone]  Physical Therapy Treatment  Patient Details  Name: JAMARIYAH JOHANNSEN MRN: [id number] Date of Birth: 1929-12-24 Referring Provider (PT): [name], MD   Encounter Date: 01/21/2020   PT End of Session - 01/22/20 1325    Visit Number 9    Number of Visits 10    Date for PT Re-Evaluation 01/29/20    Authorization Type Medicare    Authorization Time Period 12-29-19 - 03-18-20    PT Start Time 0801    PT Stop Time 0850    PT Time Calculation (min) 49 min    Equipment Utilized During Treatment Gait belt    Activity Tolerance Patient tolerated treatment well    Behavior During Therapy [facility] for tasks assessed/performed           Past Medical History:  Diagnosis Date  . Complication of anesthesia    Nausea/Vomiting  . Hypertension     Past Surgical History:  Procedure Laterality Date  . ERCP N/A 02/27/2019   Procedure: ENDOSCOPIC RETROGRADE CHOLANGIOPANCREATOGRAPHY (ERCP);  Surgeon: [name], MD;  Location: [facility];  Service: Endoscopy;  Laterality: N/A;  . PACEMAKER IMPLANT N/A 11/09/2019   Procedure: PACEMAKER IMPLANT;  Surgeon: [name], MD;  Location: [facility];  Service: Cardiovascular;  Laterality: N/A;  . REMOVAL OF STONES  02/27/2019   Procedure: REMOVAL OF STONES;  Surgeon: [name], MD;  Location: [facility];  Service: Endoscopy;;  . [name]  02/27/2019   Procedure: [name];  Surgeon: [name], MD;  Location: [facility];  Service: Endoscopy;;    There were no vitals filed for this visit.   Subjective Assessment - 01/21/20 0803    Subjective Pt states balance is better - is more confident with walking now; walked to her mailbox using walking stick with son-in-law standing close by her; pt wearing sneakers today - not the slip on shoes   pt states she has a  "light-headedness" in her head   Patient is accompained by: Family member   son-in-law - Todd   Patient Stated Goals resolve the vertigo and improve balance - "get back to the way I was"    Currently in Pain? No/denies                             [facility] Adult PT Treatment/Exercise - 01/22/20 0001      Ambulation/Gait   Ambulation/Gait Yes    Ambulation/Gait Assistance 5: Supervision    Ambulation Distance (Feet) 230 Feet    Assistive device None    Gait Pattern Within Functional Limits    Ambulation Surface Level;Indoor    Stairs Yes    Stairs Assistance 5: Supervision    Stairs Assistance Details (indicate cue type and reason) cues to hang toes over edge of step for increased ROM during descension     Stair Management Technique One rail Right;Alternating pattern;Forwards    Number of Stairs 4    Height of Stairs 6    Ramp 5: Supervision    Ramp Details (indicate cue type and reason) no device used    Curb 5: Supervision               Balance Exercises - 01/22/20 0001      Balance Exercises: Standing   Standing Eyes Opened Wide (BOA);Head turns;Solid surface;Foam/compliant surface;5 reps  horizontal and vertical head turns   Standing Eyes Closed Wide (BOA);Head turns;Foam/compliant surface;5 reps   horizontal and vertical head turns   Rockerboard Anterior/posterior    Sidestepping 2 reps   inside // bars without UE support   Step Over Hurdles / Cones stepping over black balance beam 5 reps each leg with UE support prn with CGA    Marching Solid surface;Static;Dynamic   15' straight ahead (dynamic) with min assist   Other Standing Exercises tap ups to 1st step 5 reps each leg; then tap ups to 2nd step 5 reps each leg with CGA    Other Standing Exercises Comments performed amb. tossing and catching ball - straight up and then Rt/Lt sides with min guard for safety;  pt performed backwards amb. tossing ball approx. 20' with min to mod assist for recovery of  posterior LOB                   PT Long Term Goals - 01/22/20 1328      PT LONG TERM GOAL #1   Title Improve DHI score from 70% to </= 36% to demo improvement in vertigo.    Baseline 12-30-19 70%    Time 4    Period Weeks    Status New      PT LONG TERM GOAL #2   Title Pt will have (-) Rt horizontal roll test to indicate resolution of Rt horizontal canal BPPV.    Time 4    Period Weeks    Status New      PT LONG TERM GOAL #3   Title Pt will participate in balance testing after resolution of vertigo.    Baseline TUG score 11.62 secs on 2nd trial - no device -- 01-21-20    Time 4    Period Weeks    Status Achieved      PT LONG TERM GOAL #4   Title Pt will be independent in HEP for balance and habituation exercises.    Baseline met 01-21-20    Time 4    Period Weeks    Status Achieved                 Plan - 01/22/20 1325    Clinical Impression Statement Pt is improving well with balance and gait; pt remains very fearful of falling and does lose balance posteriorly at times but is able to shift weight anteriorly with verbal cues - pt is aware of when she is losing balance posteriorly.  Pt able to amb. without walking stick in clinic gym without LOB.  Cont with POC - may D/C next session.    Personal Factors and Comorbidities Comorbidity 2;Age    Comorbidities bradycardia, s/p pacemaker implant 11-07-19, vertigo    Examination-Activity Limitations Locomotion Level;Transfers;Bed Mobility;Bend;Squat;Hygiene/Grooming;Dressing    Examination-Participation Restrictions Meal Prep;Cleaning;Interpersonal Relationship;Laundry;Community Activity;Shop    Stability/Clinical Decision Making Evolving/Moderate complexity    Rehab Potential Good    PT Frequency 2x / week    PT Duration 4 weeks    PT Treatment/Interventions ADLs/Self Care Home Management;Vestibular;Canalith Repostioning;Therapeutic activities;Therapeutic exercise;Balance training;Neuromuscular re-education;Gait  training;Stair training;Patient/family education    PT Next Visit Plan check balance exercises issued for HEP - cont balance on foam    PT Home Exercise Plan repetitive rolling issued on 12-29-19; Medbridge [id number]    Consulted and Agree with Plan of Care Patient;Family member/caregiver    Family Member Consulted daughter Jeannene Patella           Patient will  benefit from skilled therapeutic intervention in order to improve the following deficits and impairments:  Difficulty walking, Decreased balance, Dizziness  Visit Diagnosis: Unsteadiness on feet  Difficulty in walking, not elsewhere classified     Problem List Patient Active Problem List   Diagnosis Date Noted  . Bradycardia   . AV block, Mobitz II   . Syncope and collapse 11/08/2019    [name], PT 01/22/2020, 1:30 PM  [facility] [address] [place], [place], [address] Phone: [phone]   Fax:  [phone]  Name: ALVINE MOSTAFA MRN: [id number] Date of Birth: January 13, 1930

## 2020-02-04 ENCOUNTER — Other Ambulatory Visit: Payer: Self-pay

## 2020-02-04 ENCOUNTER — Ambulatory Visit: Payer: Medicare Other | Admitting: Physical Therapy

## 2020-02-04 DIAGNOSIS — R262 Difficulty in walking, not elsewhere classified: Secondary | ICD-10-CM | POA: Diagnosis not present

## 2020-02-04 DIAGNOSIS — R2681 Unsteadiness on feet: Secondary | ICD-10-CM | POA: Diagnosis not present

## 2020-02-05 ENCOUNTER — Encounter: Payer: Self-pay | Admitting: Physical Therapy

## 2020-02-05 NOTE — Therapy (Signed)
[facility] [address] [place], [place], [address] Phone: [phone]   Fax:  [phone]  Physical Therapy Treatment & 10th visit progress note/Discharge summary    Reporting Period:  01-01-20 - 02-04-20 See below for progress towards goals  Patient Details  Name: Tammy Dillon MRN: [id number] Date of Birth: August 28, 1929 Referring Provider (PT): [name], MD   Encounter Date: 02/04/2020   PT End of Session - 02/05/20 1126    Visit Number 10    Number of Visits 10    Date for PT Re-Evaluation 01/29/20    Authorization Type Medicare    Authorization Time Period 12-29-19 - 03-18-20    PT Start Time 1018    PT Stop Time 1056    PT Time Calculation (min) 38 min    Equipment Utilized During Treatment --    Activity Tolerance Patient tolerated treatment well    Behavior During Therapy [facility] for tasks assessed/performed           Past Medical History:  Diagnosis Date   Complication of anesthesia    Nausea/Vomiting   Hypertension     Past Surgical History:  Procedure Laterality Date   ERCP N/A 02/27/2019   Procedure: ENDOSCOPIC RETROGRADE CHOLANGIOPANCREATOGRAPHY (ERCP);  Surgeon: [name], MD;  Location: [facility];  Service: Endoscopy;  Laterality: N/A;   PACEMAKER IMPLANT N/A 11/09/2019   Procedure: PACEMAKER IMPLANT;  Surgeon: [name], MD;  Location: [facility];  Service: Cardiovascular;  Laterality: N/A;   REMOVAL OF STONES  02/27/2019   Procedure: REMOVAL OF STONES;  Surgeon: [name], MD;  Location: [facility];  Service: Endoscopy;;   SPHINCTEROTOMY  02/27/2019   Procedure: [name];  Surgeon: [name], MD;  Location: [facility];  Service: Endoscopy;;    There were no vitals filed for this visit.   Subjective Assessment - 02/04/20 1022    Subjective Pt states balance is much improved - dizziness remains resolved; pt and son-in-law feel that pt is able  to finish up PT today; pt states she walked to her mailbox yesterday by herself "but took my stick with me"   pt states she has a "light-headedness" in her head   Patient is accompained by: Family member   son-in-law - Tammy Dillon   Patient Stated Goals resolve the vertigo and improve balance - "get back to the way I was"    Currently in Pain? No/denies                             [facility] Adult PT Treatment/Exercise - 02/05/20 0001      Ambulation/Gait   Ambulation/Gait Yes    Ambulation/Gait Assistance 5: Supervision    Ambulation Distance (Feet) 75 Feet    Assistive device None    Gait Pattern Within Functional Limits    Ambulation Surface Level;Indoor    Stairs Yes    Stairs Assistance 5: Supervision    Stair Management Technique One rail Right;Alternating pattern;Forwards    Number of Stairs 4    Height of Stairs 6               Balance Exercises - 02/05/20 0001      Balance Exercises: Standing   Standing Eyes Opened Wide (BOA);Head turns;Solid surface;5 reps   horizontal head turns   Standing Eyes Closed Wide (BOA);Solid surface;1 rep   10 sec hold   Step Over Hurdles / Cones stepping over  black balance beam 5 reps each leg with UE support prn with CGA    Other Standing Exercises tap ups to 1st step 5 reps each leg; then tap ups to 2nd step 5 reps each leg with CGA    Other Standing Exercises Comments ankle sway exercise attempted at counter but pt unable to dorsiflex feet and control posterior sway without UE support on counter              PT Education - 02/05/20 1125    Education Details instructed pt & son-in-law in correct technique for floor to stand transfer (pt declined to perform this transfer); reviewed HEP - discontinued the balance on foam exercises and kept basic balance exercises    Person(s) Educated Patient    Methods Explanation;Demonstration    Comprehension Verbalized understanding               PT Long Term Goals - 02/04/20  1024      PT LONG TERM GOAL #1   Title Improve DHI score from 70% to </= 36% to demo improvement in vertigo.    Baseline 12-30-19 70% :  met 02-05-20 with score 22% (mild handicap group)    Time 4    Period Weeks    Status Achieved      PT LONG TERM GOAL #2   Title Pt will have (-) Rt horizontal roll test to indicate resolution of Rt horizontal canal BPPV.    Baseline met 02-05-20    Time 4    Period Weeks    Status Achieved      PT LONG TERM GOAL #3   Title Pt will participate in balance testing after resolution of vertigo.    Baseline TUG score 11.62 secs on 2nd trial - no device -- 01-21-20    Time 4    Period Weeks    Status Achieved      PT LONG TERM GOAL #4   Title Pt will be independent in HEP for balance and habituation exercises.    Baseline met 01-21-20    Time 4    Period Weeks    Status Achieved                 Plan - 02/05/20 1129    Clinical Impression Statement Pt has met all LTG's; BPPV has resolved at this time and balance and gait have significantly improved.  Pt states she is pleased with progress and feels she is ready for D/C at this time.    Personal Factors and Comorbidities Comorbidity 2;Age    Comorbidities bradycardia, s/p pacemaker implant 11-07-19, vertigo    Examination-Activity Limitations Locomotion Level;Transfers;Bed Mobility;Bend;Squat;Hygiene/Grooming;Dressing    Examination-Participation Restrictions Meal Prep;Cleaning;Interpersonal Relationship;Laundry;Community Activity;Shop    Stability/Clinical Decision Making Evolving/Moderate complexity    Rehab Potential Good    PT Frequency 2x / week    PT Duration 4 weeks    PT Treatment/Interventions ADLs/Self Care Home Management;Vestibular;Canalith Repostioning;Therapeutic activities;Therapeutic exercise;Balance training;Neuromuscular re-education;Gait training;Stair training;Patient/family education    PT Next Visit Plan D/C on 02-04-20    PT Home Exercise Plan repetitive rolling issued  on 12-29-19; [employer] [id number]    Consulted and Agree with Plan of Care Patient;Family member/caregiver    Family Member Consulted daughter Tammy Dillon           Patient will benefit from skilled therapeutic intervention in order to improve the following deficits and impairments:  Difficulty walking, Decreased balance, Dizziness  Visit Diagnosis: Unsteadiness on feet  Difficulty in walking, not  elsewhere classified     Problem List Patient Active Problem List   Diagnosis Date Noted   Bradycardia    AV block, Mobitz II    Syncope and collapse 11/08/2019     PHYSICAL THERAPY DISCHARGE SUMMARY  Visits from Start of Care: 10  Current functional level related to goals / functional outcomes: Pt has met all LTG's - BPPV has resolved as of this time   Remaining deficits: Continued age-related decreased balance skills   Education / Equipment: Pt has been instructed in HEP for balance. Plan: Patient agrees to discharge.  Patient goals were met. Patient is being discharged due to meeting the stated rehab goals.  ?????          [name], PT 02/05/2020, 11:55 AM  [facility] [address] [place], [place], [address] Phone: [phone]   Fax:  [phone]  Name: Tammy Dillon MRN: [id number] Date of Birth: 04/08/1929

## 2020-02-10 ENCOUNTER — Ambulatory Visit (INDEPENDENT_AMBULATORY_CARE_PROVIDER_SITE_OTHER): Payer: Medicare Other

## 2020-02-10 DIAGNOSIS — I441 Atrioventricular block, second degree: Secondary | ICD-10-CM

## 2020-02-11 LAB — CUP PACEART REMOTE DEVICE CHECK
Battery Remaining Longevity: 180 mo
Battery Voltage: 3.21 V
Brady Statistic AP VP Percent: 0.66 %
Brady Statistic AP VS Percent: 1.56 %
Brady Statistic AS VP Percent: 0.86 %
Brady Statistic AS VS Percent: 96.91 %
Brady Statistic RA Percent Paced: 2.38 %
Brady Statistic RV Percent Paced: 1.53 %
Date Time Interrogation Session: 20211123210908
Implantable Lead Implant Date: 20210823
Implantable Lead Implant Date: 20210823
Implantable Lead Location: 753859
Implantable Lead Location: 753860
Implantable Lead Model: 5076
Implantable Lead Model: 5076
Implantable Pulse Generator Implant Date: 20210823
Lead Channel Impedance Value: 323 Ohm
Lead Channel Impedance Value: 380 Ohm
Lead Channel Impedance Value: 475 Ohm
Lead Channel Impedance Value: 551 Ohm
Lead Channel Pacing Threshold Amplitude: 0.375 V
Lead Channel Pacing Threshold Amplitude: 0.5 V
Lead Channel Pacing Threshold Pulse Width: 0.4 ms
Lead Channel Pacing Threshold Pulse Width: 0.4 ms
Lead Channel Sensing Intrinsic Amplitude: 13.375 mV
Lead Channel Sensing Intrinsic Amplitude: 13.375 mV
Lead Channel Sensing Intrinsic Amplitude: 2.375 mV
Lead Channel Sensing Intrinsic Amplitude: 2.375 mV
Lead Channel Setting Pacing Amplitude: 3.5 V
Lead Channel Setting Pacing Amplitude: 3.5 V
Lead Channel Setting Pacing Pulse Width: 0.4 ms
Lead Channel Setting Sensing Sensitivity: 0.9 mV

## 2020-02-15 ENCOUNTER — Other Ambulatory Visit: Payer: Self-pay

## 2020-02-15 ENCOUNTER — Ambulatory Visit (INDEPENDENT_AMBULATORY_CARE_PROVIDER_SITE_OTHER): Payer: Medicare Other | Admitting: Cardiology

## 2020-02-15 VITALS — BP 148/54 | HR 88 | Ht 59.0 in | Wt 114.0 lb

## 2020-02-15 DIAGNOSIS — I441 Atrioventricular block, second degree: Secondary | ICD-10-CM

## 2020-02-15 DIAGNOSIS — Z950 Presence of cardiac pacemaker: Secondary | ICD-10-CM | POA: Diagnosis not present

## 2020-02-15 DIAGNOSIS — R55 Syncope and collapse: Secondary | ICD-10-CM

## 2020-02-15 LAB — CUP PACEART INCLINIC DEVICE CHECK
Battery Remaining Longevity: 181 mo
Battery Voltage: 3.21 V
Brady Statistic AP VP Percent: 1.05 %
Brady Statistic AP VS Percent: 1.75 %
Brady Statistic AS VP Percent: 0.59 %
Brady Statistic AS VS Percent: 96.61 %
Brady Statistic RA Percent Paced: 2.89 %
Brady Statistic RV Percent Paced: 1.64 %
Date Time Interrogation Session: 20211129143511
Implantable Lead Implant Date: 20210823
Implantable Lead Implant Date: 20210823
Implantable Lead Location: 753859
Implantable Lead Location: 753860
Implantable Lead Model: 5076
Implantable Lead Model: 5076
Implantable Pulse Generator Implant Date: 20210823
Lead Channel Impedance Value: 323 Ohm
Lead Channel Impedance Value: 399 Ohm
Lead Channel Impedance Value: 532 Ohm
Lead Channel Impedance Value: 608 Ohm
Lead Channel Pacing Threshold Amplitude: 0.5 V
Lead Channel Pacing Threshold Amplitude: 0.5 V
Lead Channel Pacing Threshold Pulse Width: 0.4 ms
Lead Channel Pacing Threshold Pulse Width: 0.4 ms
Lead Channel Sensing Intrinsic Amplitude: 16 mV
Lead Channel Sensing Intrinsic Amplitude: 2.8 mV
Lead Channel Setting Pacing Amplitude: 2 V
Lead Channel Setting Pacing Amplitude: 2.5 V
Lead Channel Setting Pacing Pulse Width: 0.4 ms
Lead Channel Setting Sensing Sensitivity: 0.9 mV

## 2020-02-15 NOTE — Progress Notes (Signed)
Electrophysiology Office Follow up Visit Note:    Date:  02/15/2020   ID:  Tammy Dillon, DOB 09-27-1929, MRN [id number]  PCP:  [name], MD  [place] Cardiologist:  No primary care provider on file.  [facility] Electrophysiologist:  Vickie Epley, MD    Interval History:    Tammy Dillon is a 86 y.o. female who presents for a follow up visit after permanent pacemaker implantation on November 09, 2019 for recurrent syncope.  Since implant she reports no further episodes of syncope.  She feels better since the pacemaker is implanted.  Pocket healed well.   Past Medical History:  Diagnosis Date  . Complication of anesthesia    Nausea/Vomiting  . Hypertension     Past Surgical History:  Procedure Laterality Date  . ERCP N/A 02/27/2019   Procedure: ENDOSCOPIC RETROGRADE CHOLANGIOPANCREATOGRAPHY (ERCP);  Surgeon: [name], MD;  Location: [facility];  Service: Endoscopy;  Laterality: N/A;  . PACEMAKER IMPLANT N/A 11/09/2019   Procedure: PACEMAKER IMPLANT;  Surgeon: [name], MD;  Location: [facility];  Service: Cardiovascular;  Laterality: N/A;  . REMOVAL OF STONES  02/27/2019   Procedure: REMOVAL OF STONES;  Surgeon: [name], MD;  Location: [facility];  Service: Endoscopy;;  . [name]  02/27/2019   Procedure: [name];  Surgeon: [name], MD;  Location: [facility];  Service: Endoscopy;;    Current Medications: Current Meds  Medication Sig  . aspirin 81 MG EC tablet Take 81 mg by mouth every Monday, Tuesday, Wednesday, Thursday, and Friday. Take  . Cholecalciferol (VITAMIN D3) 50 MCG (2000 UT) TABS Take 2,000 Units by mouth every evening.  . desloratadine (CLARINEX) 5 MG tablet Take 5 mg by mouth every evening.   . hydroxypropyl methylcellulose / hypromellose (ISOPTO TEARS / GONIOVISC) 2.5 % ophthalmic solution Place 1 drop into both eyes daily as needed for dry eyes.  Marland Kitchen levocetirizine (XYZAL) 5 MG tablet Take 5 mg  by mouth every evening.  . ondansetron (ZOFRAN) 4 MG tablet Take 4 mg by mouth as needed.  Marland Kitchen OVER THE COUNTER MEDICATION Take 7.5-10 mLs by mouth See admin instructions. Superior Elemental Collodial Silver - Chemical Free- Immune Support- Take 7.5-10 ml's by mouth once a day  . raloxifene (EVISTA) 60 MG tablet Take 60 mg by mouth every evening.  . rosuvastatin (CRESTOR) 5 MG tablet Take 5 mg by mouth every Monday, Wednesday, and Friday. In the evening  . telmisartan-hydrochlorothiazide (MICARDIS HCT) 80-25 MG tablet Take 0.5 tablets by mouth 2 (two) times a week.   . [DISCONTINUED] Calcium Carbonate (CALCIUM 600 PO) Take 600 mg by mouth daily.  . [DISCONTINUED] omeprazole (PRILOSEC) 20 MG capsule Take 20 mg by mouth daily.  . [DISCONTINUED] zinc gluconate 50 MG tablet Take 50 mg by mouth every evening.     Allergies:   Aldara [imiquimod], Other, and Codeine   Social History   Socioeconomic History  . Marital status: Widowed    Spouse name: Not on file  . Number of children: Not on file  . Years of education: Not on file  . Highest education level: Not on file  Occupational History  . Not on file  Tobacco Use  . Smoking status: Not on file  Substance and Sexual Activity  . Alcohol use: Not on file  . Drug use: Not on file  . Sexual activity: Not on file  Other Topics Concern  . Not on file  Social History Narrative  . Not  on file   Social Determinants of Health   Financial Resource Strain:   . Difficulty of Paying Living Expenses: Not on file  Food Insecurity:   . Worried About Charity fundraiser in the Last Year: Not on file  . Ran Out of Food in the Last Year: Not on file  Transportation Needs:   . Lack of Transportation (Medical): Not on file  . Lack of Transportation (Non-Medical): Not on file  Physical Activity:   . Days of Exercise per Week: Not on file  . Minutes of Exercise per Session: Not on file  Stress:   . Feeling of Stress : Not on file  Social  Connections:   . Frequency of Communication with Friends and Family: Not on file  . Frequency of Social Gatherings with Friends and Family: Not on file  . Attends Religious Services: Not on file  . Active Member of Clubs or Organizations: Not on file  . Attends Archivist Meetings: Not on file  . Marital Status: Not on file     Family History: The patient's family history includes Cancer in her mother; Cancer - Lung in her father; Diabetes in her son.  ROS:   Please see the history of present illness.    All other systems reviewed and are negative.  EKGs/Labs/Other Studies Reviewed:    The following studies were reviewed today: Pacemaker implant note, prior hospitalization records, and person device interrogation  February 15, 2020 in person device interrogation personally reviewed Longevity estimated at 15 years Presenting rhythm a sensed V sense DDD 60-1 20 Atrial lead impedance 380 ohms, capture threshold 0.375 0.4, P waves 2.6 mV Right ventricular lead impedance 589 ohms, capture threshold 0.50.4, R wave 14.5 mV Atrially pacing 2.9%, ventricularly pacing 1.6% No atrial fibrillation Patient activity 0.9 hours/day      EKG:  The ekg ordered today demonstrates normal sinus rhythm.  No pacing on today's EKG  Recent Labs: 11/08/2019: Magnesium 2.4 11/09/2019: BUN 29; Creatinine, Ser 1.16; Hemoglobin 9.6; Platelets 198; Potassium 4.0; Sodium 139; TSH 8.786  Recent Lipid Panel No results found for: CHOL, TRIG, HDL, CHOLHDL, VLDL, LDLCALC, LDLDIRECT  Physical Exam:    VS:  BP (!) 148/54   Pulse 88   Ht 4\' 11"  (1.499 m)   Wt 114 lb (51.7 kg)   SpO2 96%   BMI 23.03 kg/m     Wt Readings from Last 3 Encounters:  02/15/20 114 lb (51.7 kg)  11/10/19 119 lb (54 kg)  02/27/19 110 lb (49.9 kg)     GEN:  Well nourished, well developed in no acute distress HEENT: Normal NECK: No JVD; No carotid bruits LYMPHATICS: No lymphadenopathy CARDIAC: RRR, no murmurs,  rubs, gallops.  Pacemaker pocket well-healed. RESPIRATORY:  Clear to auscultation without rales, wheezing or rhonchi  ABDOMEN: Soft, non-tender, non-distended MUSCULOSKELETAL:  No edema; No deformity  SKIN: Warm and dry NEUROLOGIC:  Alert and oriented x 3 PSYCHIATRIC:  Normal affect   ASSESSMENT:    1. Syncope and collapse   2. AV block, Mobitz II   3. Cardiac pacemaker in situ    PLAN:    In order of problems listed above:  1. Syncope No recurrent episodes since permanent pacemaker implanted.  2.  Cardiac pacemaker in situ Medtronic dual-chamber permanent pacemaker implanted November 09, 2019. In person device interrogation shows stable lead parameters. Follow-up 12 months. Reprogrammed safety margins during today's device interrogation.  Medication Adjustments/Labs and Tests Ordered: Current medicines are reviewed at  length with the patient today.  Concerns regarding medicines are outlined above.  Orders Placed This Encounter  Procedures  . CUP PACEART [place]  . EKG 12-Lead   No orders of the defined types were placed in this encounter.    Signed, Lars Mage, MD, [facility]  02/15/2020 10:27 PM    Electrophysiology [place]

## 2020-02-15 NOTE — Patient Instructions (Signed)
Medication Instructions:  Your physician recommends that you continue on your current medications as directed. Please refer to the Current Medication list given to you today.  Labwork: None ordered.  Testing/Procedures: None ordered.  Follow-Up: Your physician wants you to follow-up in: one year with Dr. Quentin Ore.   You will receive a reminder letter in the mail two months in advance. If you don't receive a letter, please call our office to schedule the follow-up appointment.  Remote monitoring is used to monitor your Pacemaker from home. This monitoring reduces the number of office visits required to check your device to one time per year. It allows [place] to keep an eye on the functioning of your device to ensure it is working properly. You are scheduled for a device check from home on 05/11/2020. You may send your transmission at any time that day. If you have a wireless device, the transmission will be sent automatically. After your physician reviews your transmission, you will receive a postcard with your next transmission date.  Any Other Special Instructions Will Be Listed Below (If Applicable).  If you need a refill on your cardiac medications before your next appointment, please call your pharmacy.

## 2020-02-16 ENCOUNTER — Encounter: Payer: Self-pay | Admitting: Cardiology

## 2020-02-16 DIAGNOSIS — Z0000 Encounter for general adult medical examination without abnormal findings: Secondary | ICD-10-CM | POA: Diagnosis not present

## 2020-02-16 DIAGNOSIS — Z7189 Other specified counseling: Secondary | ICD-10-CM | POA: Diagnosis not present

## 2020-02-16 DIAGNOSIS — H1013 Acute atopic conjunctivitis, bilateral: Secondary | ICD-10-CM | POA: Diagnosis not present

## 2020-02-16 DIAGNOSIS — R7309 Other abnormal glucose: Secondary | ICD-10-CM | POA: Diagnosis not present

## 2020-02-16 DIAGNOSIS — Z1389 Encounter for screening for other disorder: Secondary | ICD-10-CM | POA: Diagnosis not present

## 2020-02-16 DIAGNOSIS — H8110 Benign paroxysmal vertigo, unspecified ear: Secondary | ICD-10-CM | POA: Diagnosis not present

## 2020-02-16 DIAGNOSIS — Z79899 Other long term (current) drug therapy: Secondary | ICD-10-CM | POA: Diagnosis not present

## 2020-02-16 DIAGNOSIS — E559 Vitamin D deficiency, unspecified: Secondary | ICD-10-CM | POA: Diagnosis not present

## 2020-02-16 DIAGNOSIS — I10 Essential (primary) hypertension: Secondary | ICD-10-CM | POA: Diagnosis not present

## 2020-02-16 DIAGNOSIS — Z950 Presence of cardiac pacemaker: Secondary | ICD-10-CM | POA: Diagnosis not present

## 2020-02-16 DIAGNOSIS — R634 Abnormal weight loss: Secondary | ICD-10-CM | POA: Diagnosis not present

## 2020-02-17 NOTE — Progress Notes (Signed)
Remote pacemaker transmission.   

## 2020-02-29 DIAGNOSIS — Z85828 Personal history of other malignant neoplasm of skin: Secondary | ICD-10-CM | POA: Diagnosis not present

## 2020-02-29 DIAGNOSIS — L814 Other melanin hyperpigmentation: Secondary | ICD-10-CM | POA: Diagnosis not present

## 2020-02-29 DIAGNOSIS — D1801 Hemangioma of skin and subcutaneous tissue: Secondary | ICD-10-CM | POA: Diagnosis not present

## 2020-02-29 DIAGNOSIS — L821 Other seborrheic keratosis: Secondary | ICD-10-CM | POA: Diagnosis not present

## 2020-03-02 DIAGNOSIS — I10 Essential (primary) hypertension: Secondary | ICD-10-CM | POA: Diagnosis not present

## 2020-03-02 DIAGNOSIS — M810 Age-related osteoporosis without current pathological fracture: Secondary | ICD-10-CM | POA: Diagnosis not present

## 2020-03-02 DIAGNOSIS — E782 Mixed hyperlipidemia: Secondary | ICD-10-CM | POA: Diagnosis not present

## 2020-03-24 DIAGNOSIS — I10 Essential (primary) hypertension: Secondary | ICD-10-CM | POA: Diagnosis not present

## 2020-03-24 DIAGNOSIS — Z950 Presence of cardiac pacemaker: Secondary | ICD-10-CM | POA: Diagnosis not present

## 2020-03-24 DIAGNOSIS — R634 Abnormal weight loss: Secondary | ICD-10-CM | POA: Diagnosis not present

## 2020-03-24 DIAGNOSIS — H8110 Benign paroxysmal vertigo, unspecified ear: Secondary | ICD-10-CM | POA: Diagnosis not present

## 2020-04-14 DIAGNOSIS — E782 Mixed hyperlipidemia: Secondary | ICD-10-CM | POA: Diagnosis not present

## 2020-04-14 DIAGNOSIS — M810 Age-related osteoporosis without current pathological fracture: Secondary | ICD-10-CM | POA: Diagnosis not present

## 2020-04-14 DIAGNOSIS — I10 Essential (primary) hypertension: Secondary | ICD-10-CM | POA: Diagnosis not present

## 2020-04-26 DIAGNOSIS — M810 Age-related osteoporosis without current pathological fracture: Secondary | ICD-10-CM | POA: Diagnosis not present

## 2020-04-26 DIAGNOSIS — E782 Mixed hyperlipidemia: Secondary | ICD-10-CM | POA: Diagnosis not present

## 2020-04-26 DIAGNOSIS — I10 Essential (primary) hypertension: Secondary | ICD-10-CM | POA: Diagnosis not present

## 2020-05-11 ENCOUNTER — Ambulatory Visit (INDEPENDENT_AMBULATORY_CARE_PROVIDER_SITE_OTHER): Payer: Medicare Other

## 2020-05-11 DIAGNOSIS — I441 Atrioventricular block, second degree: Secondary | ICD-10-CM

## 2020-05-11 LAB — CUP PACEART REMOTE DEVICE CHECK
Battery Remaining Longevity: 178 mo
Battery Voltage: 3.19 V
Brady Statistic AP VP Percent: 0.61 %
Brady Statistic AP VS Percent: 0.71 %
Brady Statistic AS VP Percent: 1.83 %
Brady Statistic AS VS Percent: 96.85 %
Brady Statistic RA Percent Paced: 1.42 %
Brady Statistic RV Percent Paced: 2.44 %
Date Time Interrogation Session: 20220222223806
Implantable Lead Implant Date: 20210823
Implantable Lead Implant Date: 20210823
Implantable Lead Location: 753859
Implantable Lead Location: 753860
Implantable Lead Model: 5076
Implantable Lead Model: 5076
Implantable Pulse Generator Implant Date: 20210823
Lead Channel Impedance Value: 323 Ohm
Lead Channel Impedance Value: 361 Ohm
Lead Channel Impedance Value: 475 Ohm
Lead Channel Impedance Value: 532 Ohm
Lead Channel Pacing Threshold Amplitude: 0.375 V
Lead Channel Pacing Threshold Amplitude: 0.5 V
Lead Channel Pacing Threshold Pulse Width: 0.4 ms
Lead Channel Pacing Threshold Pulse Width: 0.4 ms
Lead Channel Sensing Intrinsic Amplitude: 14.875 mV
Lead Channel Sensing Intrinsic Amplitude: 14.875 mV
Lead Channel Sensing Intrinsic Amplitude: 2 mV
Lead Channel Sensing Intrinsic Amplitude: 2 mV
Lead Channel Setting Pacing Amplitude: 2 V
Lead Channel Setting Pacing Amplitude: 2.5 V
Lead Channel Setting Pacing Pulse Width: 0.4 ms
Lead Channel Setting Sensing Sensitivity: 0.9 mV

## 2020-05-20 NOTE — Progress Notes (Signed)
Remote pacemaker transmission.   

## 2020-05-30 DIAGNOSIS — I10 Essential (primary) hypertension: Secondary | ICD-10-CM | POA: Diagnosis not present

## 2020-05-30 DIAGNOSIS — E782 Mixed hyperlipidemia: Secondary | ICD-10-CM | POA: Diagnosis not present

## 2020-05-30 DIAGNOSIS — M810 Age-related osteoporosis without current pathological fracture: Secondary | ICD-10-CM | POA: Diagnosis not present

## 2020-07-28 DIAGNOSIS — E782 Mixed hyperlipidemia: Secondary | ICD-10-CM | POA: Diagnosis not present

## 2020-07-28 DIAGNOSIS — I10 Essential (primary) hypertension: Secondary | ICD-10-CM | POA: Diagnosis not present

## 2020-07-28 DIAGNOSIS — M810 Age-related osteoporosis without current pathological fracture: Secondary | ICD-10-CM | POA: Diagnosis not present

## 2020-08-10 ENCOUNTER — Ambulatory Visit (INDEPENDENT_AMBULATORY_CARE_PROVIDER_SITE_OTHER): Payer: Medicare Other

## 2020-08-10 DIAGNOSIS — I10 Essential (primary) hypertension: Secondary | ICD-10-CM | POA: Diagnosis not present

## 2020-08-10 DIAGNOSIS — R7303 Prediabetes: Secondary | ICD-10-CM | POA: Diagnosis not present

## 2020-08-10 DIAGNOSIS — E782 Mixed hyperlipidemia: Secondary | ICD-10-CM | POA: Diagnosis not present

## 2020-08-10 DIAGNOSIS — L570 Actinic keratosis: Secondary | ICD-10-CM | POA: Diagnosis not present

## 2020-08-10 DIAGNOSIS — E559 Vitamin D deficiency, unspecified: Secondary | ICD-10-CM | POA: Diagnosis not present

## 2020-08-10 DIAGNOSIS — M810 Age-related osteoporosis without current pathological fracture: Secondary | ICD-10-CM | POA: Diagnosis not present

## 2020-08-10 DIAGNOSIS — I441 Atrioventricular block, second degree: Secondary | ICD-10-CM | POA: Diagnosis not present

## 2020-08-11 LAB — CUP PACEART REMOTE DEVICE CHECK
Battery Remaining Longevity: 174 mo
Battery Voltage: 3.17 V
Brady Statistic AP VP Percent: 1.48 %
Brady Statistic AP VS Percent: 0.76 %
Brady Statistic AS VP Percent: 6.59 %
Brady Statistic AS VS Percent: 91.17 %
Brady Statistic RA Percent Paced: 2.3 %
Brady Statistic RV Percent Paced: 8.07 %
Date Time Interrogation Session: 20220524204341
Implantable Lead Implant Date: 20210823
Implantable Lead Implant Date: 20210823
Implantable Lead Location: 753859
Implantable Lead Location: 753860
Implantable Lead Model: 5076
Implantable Lead Model: 5076
Implantable Pulse Generator Implant Date: 20210823
Lead Channel Impedance Value: 323 Ohm
Lead Channel Impedance Value: 361 Ohm
Lead Channel Impedance Value: 437 Ohm
Lead Channel Impedance Value: 513 Ohm
Lead Channel Pacing Threshold Amplitude: 0.375 V
Lead Channel Pacing Threshold Amplitude: 0.5 V
Lead Channel Pacing Threshold Pulse Width: 0.4 ms
Lead Channel Pacing Threshold Pulse Width: 0.4 ms
Lead Channel Sensing Intrinsic Amplitude: 12.75 mV
Lead Channel Sensing Intrinsic Amplitude: 12.75 mV
Lead Channel Sensing Intrinsic Amplitude: 2.5 mV
Lead Channel Sensing Intrinsic Amplitude: 2.5 mV
Lead Channel Setting Pacing Amplitude: 2 V
Lead Channel Setting Pacing Amplitude: 2.5 V
Lead Channel Setting Pacing Pulse Width: 0.4 ms
Lead Channel Setting Sensing Sensitivity: 0.9 mV

## 2020-08-19 DIAGNOSIS — M810 Age-related osteoporosis without current pathological fracture: Secondary | ICD-10-CM | POA: Diagnosis not present

## 2020-08-19 DIAGNOSIS — E782 Mixed hyperlipidemia: Secondary | ICD-10-CM | POA: Diagnosis not present

## 2020-08-19 DIAGNOSIS — I10 Essential (primary) hypertension: Secondary | ICD-10-CM | POA: Diagnosis not present

## 2020-09-02 NOTE — Progress Notes (Signed)
Remote pacemaker transmission.   

## 2020-09-08 DIAGNOSIS — D0439 Carcinoma in situ of skin of other parts of face: Secondary | ICD-10-CM | POA: Diagnosis not present

## 2020-09-08 DIAGNOSIS — D485 Neoplasm of uncertain behavior of skin: Secondary | ICD-10-CM | POA: Diagnosis not present

## 2020-09-08 DIAGNOSIS — L821 Other seborrheic keratosis: Secondary | ICD-10-CM | POA: Diagnosis not present

## 2020-09-08 DIAGNOSIS — Z85828 Personal history of other malignant neoplasm of skin: Secondary | ICD-10-CM | POA: Diagnosis not present

## 2020-09-08 DIAGNOSIS — D225 Melanocytic nevi of trunk: Secondary | ICD-10-CM | POA: Diagnosis not present

## 2020-09-08 DIAGNOSIS — L814 Other melanin hyperpigmentation: Secondary | ICD-10-CM | POA: Diagnosis not present

## 2020-10-10 DIAGNOSIS — Z961 Presence of intraocular lens: Secondary | ICD-10-CM | POA: Diagnosis not present

## 2020-10-14 DIAGNOSIS — E782 Mixed hyperlipidemia: Secondary | ICD-10-CM | POA: Diagnosis not present

## 2020-10-14 DIAGNOSIS — M810 Age-related osteoporosis without current pathological fracture: Secondary | ICD-10-CM | POA: Diagnosis not present

## 2020-10-14 DIAGNOSIS — I10 Essential (primary) hypertension: Secondary | ICD-10-CM | POA: Diagnosis not present

## 2020-10-31 DIAGNOSIS — M810 Age-related osteoporosis without current pathological fracture: Secondary | ICD-10-CM | POA: Diagnosis not present

## 2020-10-31 DIAGNOSIS — I10 Essential (primary) hypertension: Secondary | ICD-10-CM | POA: Diagnosis not present

## 2020-10-31 DIAGNOSIS — E782 Mixed hyperlipidemia: Secondary | ICD-10-CM | POA: Diagnosis not present

## 2020-11-09 ENCOUNTER — Ambulatory Visit (INDEPENDENT_AMBULATORY_CARE_PROVIDER_SITE_OTHER): Payer: Medicare Other

## 2020-11-09 DIAGNOSIS — I441 Atrioventricular block, second degree: Secondary | ICD-10-CM

## 2020-11-10 LAB — CUP PACEART REMOTE DEVICE CHECK
Battery Remaining Longevity: 168 mo
Battery Voltage: 3.13 V
Brady Statistic AP VP Percent: 14.99 %
Brady Statistic AP VS Percent: 4.85 %
Brady Statistic AS VP Percent: 10.16 %
Brady Statistic AS VS Percent: 70.01 %
Brady Statistic RA Percent Paced: 19.9 %
Brady Statistic RV Percent Paced: 25.15 %
Date Time Interrogation Session: 20220823194155
Implantable Lead Implant Date: 20210823
Implantable Lead Implant Date: 20210823
Implantable Lead Location: 753859
Implantable Lead Location: 753860
Implantable Lead Model: 5076
Implantable Lead Model: 5076
Implantable Pulse Generator Implant Date: 20210823
Lead Channel Impedance Value: 323 Ohm
Lead Channel Impedance Value: 361 Ohm
Lead Channel Impedance Value: 418 Ohm
Lead Channel Impedance Value: 475 Ohm
Lead Channel Pacing Threshold Amplitude: 0.375 V
Lead Channel Pacing Threshold Amplitude: 0.5 V
Lead Channel Pacing Threshold Pulse Width: 0.4 ms
Lead Channel Pacing Threshold Pulse Width: 0.4 ms
Lead Channel Sensing Intrinsic Amplitude: 14.75 mV
Lead Channel Sensing Intrinsic Amplitude: 14.75 mV
Lead Channel Sensing Intrinsic Amplitude: 2.25 mV
Lead Channel Sensing Intrinsic Amplitude: 2.25 mV
Lead Channel Setting Pacing Amplitude: 2 V
Lead Channel Setting Pacing Amplitude: 2.5 V
Lead Channel Setting Pacing Pulse Width: 0.4 ms
Lead Channel Setting Sensing Sensitivity: 0.9 mV

## 2020-11-18 IMAGING — MR MR ABDOMEN WO/W CM MRCP
18 of 22 series · 42 of 48 positions shown · IV contrast (Gadavist)
Comparison: CT 02/11/2019

CLINICAL DATA: Loss of weight. Elevated lipase.



[Series 5: ax haste · axial · 6.0mm · 1.12mm/px · 1 of 32 slices shown]
[im 1/32]
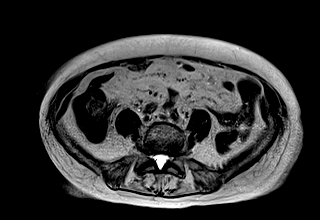

[Series 6: T2 fat-sat · axial · 6.0mm · 1.12mm/px · 1 of 35 slices shown]
[im 1/35]
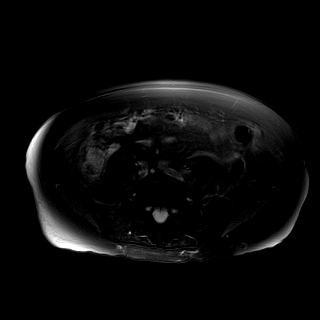

[Series 7: DWI · axial · 6.0mm · 1.34mm/px · z∈[-83,+190]mm · 3 of 117 slices shown (1 of 2)]
[im 1/117]
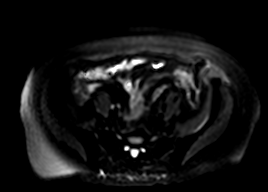
[im 59/117]
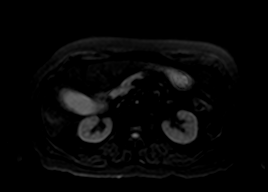
[im 117/117]
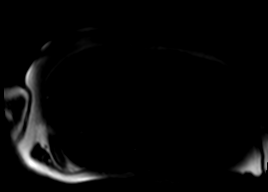

[Series 8: DWI · axial · 6.0mm · 1.34mm/px · 1 of 39 slices shown (2 of 2)]
[im 1/39]
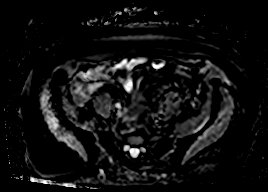

[Series 9: ax in and · axial · 3.0mm · 1.12mm/px · z∈[-77,+184]mm · 2 of 88 slices shown (1 of 2)]
[im 1/88]
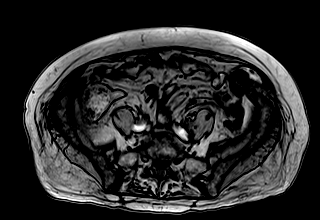
[im 88/88]
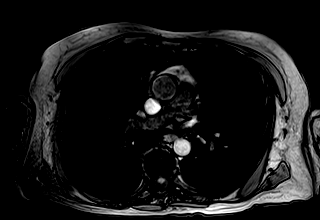

[Series 9: ax in and · axial · 3.0mm · 1.12mm/px · z∈[-77,+184]mm · 2 of 88 slices shown (2 of 2)]
[im 1/88]
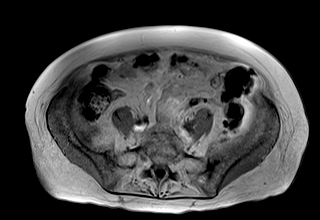
[im 88/88]
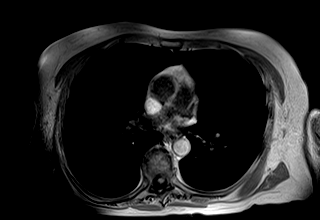

[Series 15: MRCP · coronal · 4.0mm · 1.12mm/px · 1 of 15 slices shown]
[im 1/15]
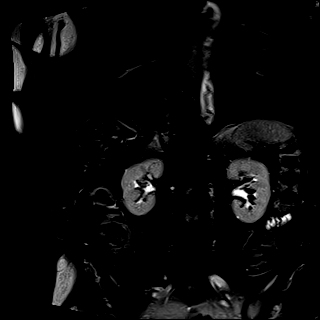

[Series 16: radials · coronal · 50.0mm · 0.78mm/px · 1 of 5 slices shown]
[im 1/5]
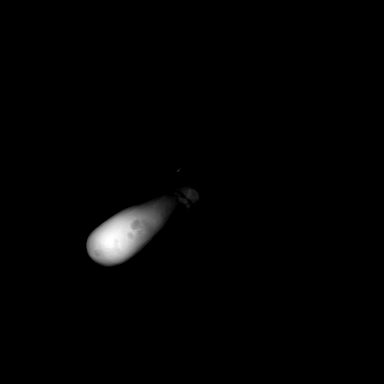

[Series 21: T1 dynamic · axial · non-contrast · 3.0mm · 1.12mm/px · z∈[-77,+184]mm · 3 of 88 slices shown (1 of 5)]
[im 1/88]
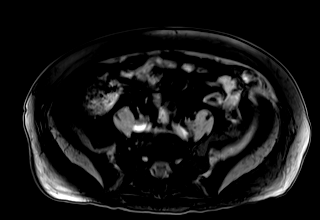
[im 44/88]
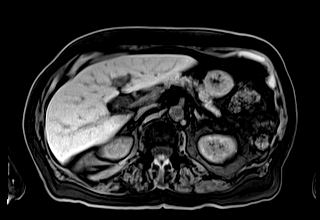
[im 88/88]
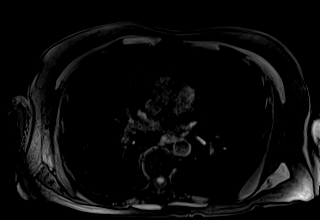

[Series 23: T1 dynamic post-contrast · axial · 3.0mm · 1.12mm/px · z∈[-77,+184]mm · 3 of 88 slices shown (1 of 5)]
[im 1/88]
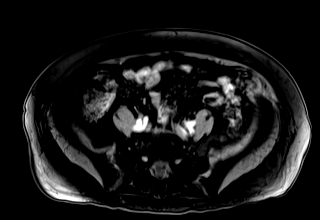
[im 44/88]
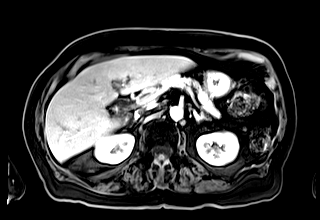
[im 88/88]
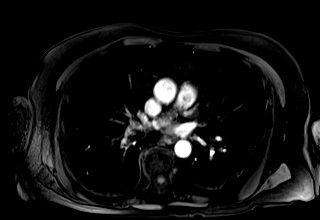

[Series 24: T1 dynamic · axial · 3.0mm · 1.12mm/px · z∈[-77,+184]mm · 3 of 88 slices shown (2 of 5)]
[im 1/88]
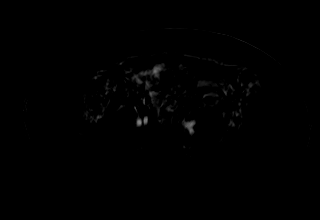
[im 44/88]
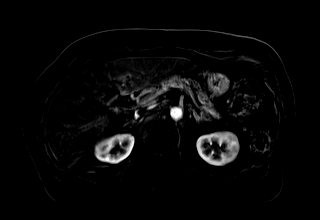
[im 88/88]
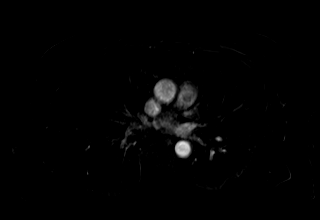

[Series 25: T1 dynamic post-contrast · axial · 3.0mm · 1.12mm/px · z∈[-77,+184]mm · 3 of 88 slices shown (2 of 5)]
[im 1/88]
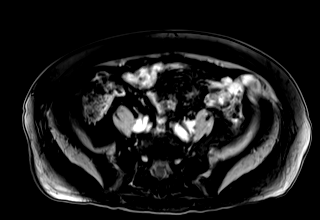
[im 44/88]
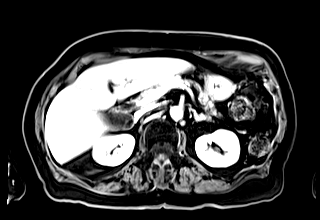
[im 88/88]
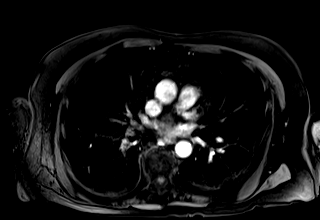

[Series 26: T1 dynamic · axial · 3.0mm · 1.12mm/px · z∈[-77,+184]mm · 3 of 88 slices shown (3 of 5)]
[im 1/88]
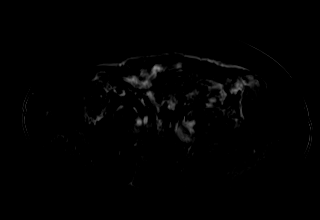
[im 44/88]
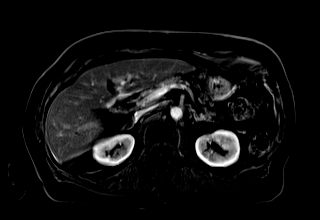
[im 88/88]
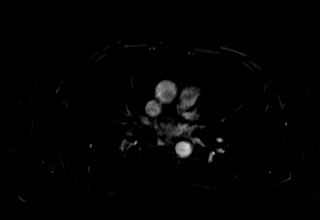

[Series 27: T1 dynamic post-contrast · axial · 3.0mm · 1.12mm/px · z∈[-77,+184]mm · 3 of 88 slices shown (3 of 5)]
[im 1/88]
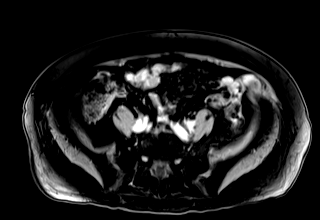
[im 44/88]
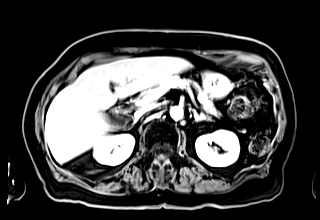
[im 88/88]
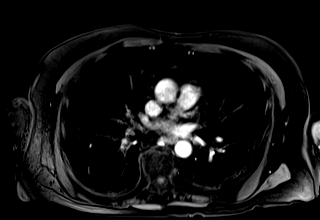

[Series 28: T1 dynamic · axial · 3.0mm · 1.12mm/px · z∈[-77,+184]mm · 3 of 88 slices shown (4 of 5)]
[im 1/88]
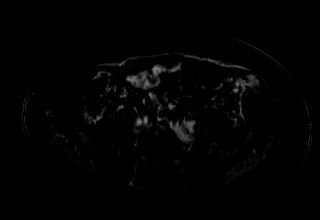
[im 44/88]
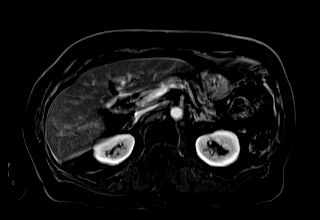
[im 88/88]
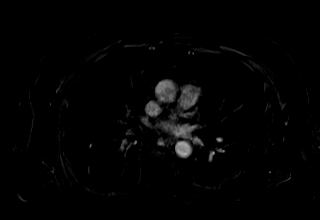

[Series 29: T1 dynamic post-contrast · axial · 3.0mm · 1.12mm/px · z∈[-77,+184]mm · 3 of 88 slices shown (4 of 5)]
[im 1/88]
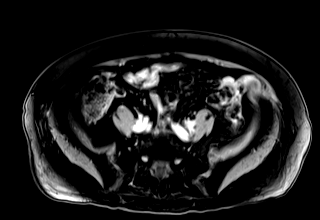
[im 44/88]
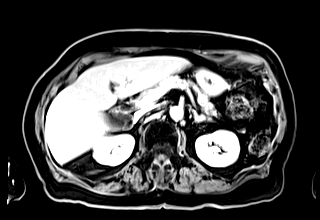
[im 88/88]
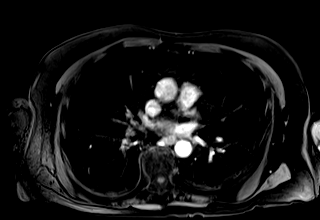

[Series 30: T1 dynamic · axial · 3.0mm · 1.12mm/px · z∈[-77,+184]mm · 3 of 88 slices shown (5 of 5)]
[im 1/88]
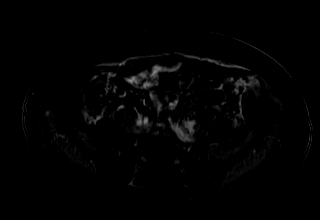
[im 44/88]
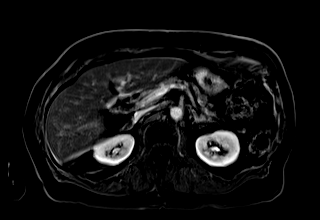
[im 88/88]
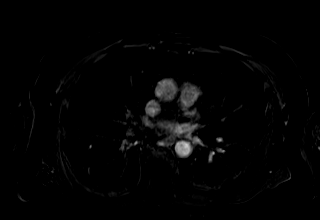

[Series 31: T1 dynamic post-contrast · coronal · 3.0mm · 1.25mm/px · 3 of 87 slices shown (5 of 5)]
[im 1/87]
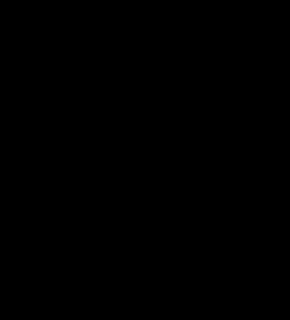
[im 44/87]
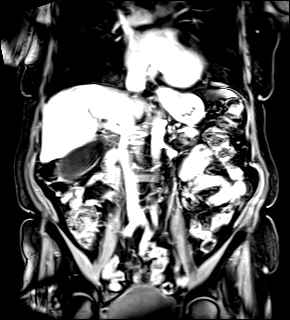
[im 87/87]
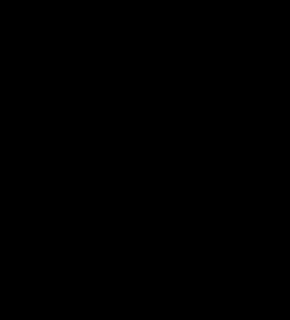

[42 of 48 positions shown; findings below may reference images not displayed]

FINDINGS: Lower chest: No acute findings.

Hepatobiliary: Mild hepatic steatosis. No focal suspicious liver
lesion identified.

Gallstones are noted measuring up to 5 mm. No gallbladder
inflammation. Mild intrahepatic biliary ductal dilatation. The
common bile duct is increased in caliber measuring 1 cm. Within the
distal common bile duct there is a stone measuring 0.8 cm, image
[DATE].

Pancreas: There is no main duct dilatation, peripancreatic fat
stranding or free fluid. No mass.

Spleen:  Within normal limits in size and appearance.

Adrenals/Urinary Tract: Normal appearance of the adrenal glands. The
kidneys are unremarkable. No mass or hydronephrosis identified.
Small lesion within the lateral cortex of the right kidney measures
5 mm and is too small to characterize.

Stomach/Bowel: Visualized portions within the abdomen are
unremarkable.

Vascular/Lymphatic: Aortic atherosclerosis. No aneurysm. No
abdominopelvic adenopathy.

Other:  No free fluid or fluid collections

Musculoskeletal: No suspicious bone lesions identified.
IMPRESSION: 1. Choledocholithiasis with increase caliber of the common bile duct
and mild intrahepatic biliary ductal dilatation.
2. Gallstones.
3. Mild hepatic steatosis.
4. These results will be called to the ordering clinician or
representative by the Radiologist Assistant, and communication
documented in the PACS or zVision Dashboard.

## 2020-11-24 NOTE — Progress Notes (Signed)
Remote pacemaker transmission.   

## 2021-02-08 ENCOUNTER — Ambulatory Visit (INDEPENDENT_AMBULATORY_CARE_PROVIDER_SITE_OTHER): Payer: Medicare Other

## 2021-02-08 DIAGNOSIS — I441 Atrioventricular block, second degree: Secondary | ICD-10-CM

## 2021-02-08 LAB — CUP PACEART REMOTE DEVICE CHECK
Battery Remaining Longevity: 159 mo
Battery Voltage: 3.06 V
Brady Statistic AP VP Percent: 14.15 %
Brady Statistic AP VS Percent: 2.34 %
Brady Statistic AS VP Percent: 49.6 %
Brady Statistic AS VS Percent: 33.92 %
Brady Statistic RA Percent Paced: 16.45 %
Brady Statistic RV Percent Paced: 63.75 %
Date Time Interrogation Session: 20221123103716
Implantable Lead Implant Date: 20210823
Implantable Lead Implant Date: 20210823
Implantable Lead Location: 753859
Implantable Lead Location: 753860
Implantable Lead Model: 5076
Implantable Lead Model: 5076
Implantable Pulse Generator Implant Date: 20210823
Lead Channel Impedance Value: 323 Ohm
Lead Channel Impedance Value: 361 Ohm
Lead Channel Impedance Value: 456 Ohm
Lead Channel Impedance Value: 513 Ohm
Lead Channel Pacing Threshold Amplitude: 0.375 V
Lead Channel Pacing Threshold Amplitude: 0.5 V
Lead Channel Pacing Threshold Pulse Width: 0.4 ms
Lead Channel Pacing Threshold Pulse Width: 0.4 ms
Lead Channel Sensing Intrinsic Amplitude: 12.5 mV
Lead Channel Sensing Intrinsic Amplitude: 12.5 mV
Lead Channel Sensing Intrinsic Amplitude: 2.375 mV
Lead Channel Sensing Intrinsic Amplitude: 2.375 mV
Lead Channel Setting Pacing Amplitude: 2 V
Lead Channel Setting Pacing Amplitude: 2.5 V
Lead Channel Setting Pacing Pulse Width: 0.4 ms
Lead Channel Setting Sensing Sensitivity: 0.9 mV

## 2021-02-16 NOTE — Progress Notes (Signed)
Remote pacemaker transmission.   

## 2021-03-14 DIAGNOSIS — D485 Neoplasm of uncertain behavior of skin: Secondary | ICD-10-CM | POA: Diagnosis not present

## 2021-03-14 DIAGNOSIS — D2261 Melanocytic nevi of right upper limb, including shoulder: Secondary | ICD-10-CM | POA: Diagnosis not present

## 2021-03-14 DIAGNOSIS — L570 Actinic keratosis: Secondary | ICD-10-CM | POA: Diagnosis not present

## 2021-03-14 DIAGNOSIS — Z85828 Personal history of other malignant neoplasm of skin: Secondary | ICD-10-CM | POA: Diagnosis not present

## 2021-03-14 DIAGNOSIS — D692 Other nonthrombocytopenic purpura: Secondary | ICD-10-CM | POA: Diagnosis not present

## 2021-03-14 DIAGNOSIS — D225 Melanocytic nevi of trunk: Secondary | ICD-10-CM | POA: Diagnosis not present

## 2021-03-14 DIAGNOSIS — C44329 Squamous cell carcinoma of skin of other parts of face: Secondary | ICD-10-CM | POA: Diagnosis not present

## 2021-03-14 DIAGNOSIS — L821 Other seborrheic keratosis: Secondary | ICD-10-CM | POA: Diagnosis not present

## 2021-03-16 DIAGNOSIS — E559 Vitamin D deficiency, unspecified: Secondary | ICD-10-CM | POA: Diagnosis not present

## 2021-03-16 DIAGNOSIS — R7303 Prediabetes: Secondary | ICD-10-CM | POA: Diagnosis not present

## 2021-03-16 DIAGNOSIS — E782 Mixed hyperlipidemia: Secondary | ICD-10-CM | POA: Diagnosis not present

## 2021-03-16 DIAGNOSIS — I10 Essential (primary) hypertension: Secondary | ICD-10-CM | POA: Diagnosis not present

## 2021-03-16 DIAGNOSIS — M810 Age-related osteoporosis without current pathological fracture: Secondary | ICD-10-CM | POA: Diagnosis not present

## 2021-03-16 DIAGNOSIS — Z0001 Encounter for general adult medical examination with abnormal findings: Secondary | ICD-10-CM | POA: Diagnosis not present

## 2021-03-16 DIAGNOSIS — L570 Actinic keratosis: Secondary | ICD-10-CM | POA: Diagnosis not present

## 2021-03-16 DIAGNOSIS — Z950 Presence of cardiac pacemaker: Secondary | ICD-10-CM | POA: Diagnosis not present

## 2021-05-09 LAB — CUP PACEART REMOTE DEVICE CHECK
Battery Remaining Longevity: 153 mo
Battery Voltage: 3.04 V
Brady Statistic AP VP Percent: 28.68 %
Brady Statistic AP VS Percent: 7.93 %
Brady Statistic AS VP Percent: 7.35 %
Brady Statistic AS VS Percent: 56.04 %
Brady Statistic RA Percent Paced: 36.59 %
Brady Statistic RV Percent Paced: 36.03 %
Date Time Interrogation Session: 20230221203536
Implantable Lead Implant Date: 20210823
Implantable Lead Implant Date: 20210823
Implantable Lead Location: 753859
Implantable Lead Location: 753860
Implantable Lead Model: 5076
Implantable Lead Model: 5076
Implantable Pulse Generator Implant Date: 20210823
Lead Channel Impedance Value: 323 Ohm
Lead Channel Impedance Value: 361 Ohm
Lead Channel Impedance Value: 437 Ohm
Lead Channel Impedance Value: 494 Ohm
Lead Channel Pacing Threshold Amplitude: 0.375 V
Lead Channel Pacing Threshold Amplitude: 0.5 V
Lead Channel Pacing Threshold Pulse Width: 0.4 ms
Lead Channel Pacing Threshold Pulse Width: 0.4 ms
Lead Channel Sensing Intrinsic Amplitude: 14.875 mV
Lead Channel Sensing Intrinsic Amplitude: 14.875 mV
Lead Channel Sensing Intrinsic Amplitude: 2.125 mV
Lead Channel Sensing Intrinsic Amplitude: 2.125 mV
Lead Channel Setting Pacing Amplitude: 2 V
Lead Channel Setting Pacing Amplitude: 2.5 V
Lead Channel Setting Pacing Pulse Width: 0.4 ms
Lead Channel Setting Sensing Sensitivity: 0.9 mV

## 2021-05-10 ENCOUNTER — Ambulatory Visit (INDEPENDENT_AMBULATORY_CARE_PROVIDER_SITE_OTHER): Payer: Medicare Other

## 2021-05-10 DIAGNOSIS — I441 Atrioventricular block, second degree: Secondary | ICD-10-CM

## 2021-05-16 NOTE — Progress Notes (Signed)
Remote pacemaker transmission.   

## 2021-08-05 IMAGING — DX DG CHEST 1V PORT
1 series · 1 of 1 positions shown · non-contrast
Comparison: No priors.

CLINICAL DATA: 89-year-old female with history of syncope.

EXAM:
PORTABLE CHEST 1 VIEW

[chest ap]
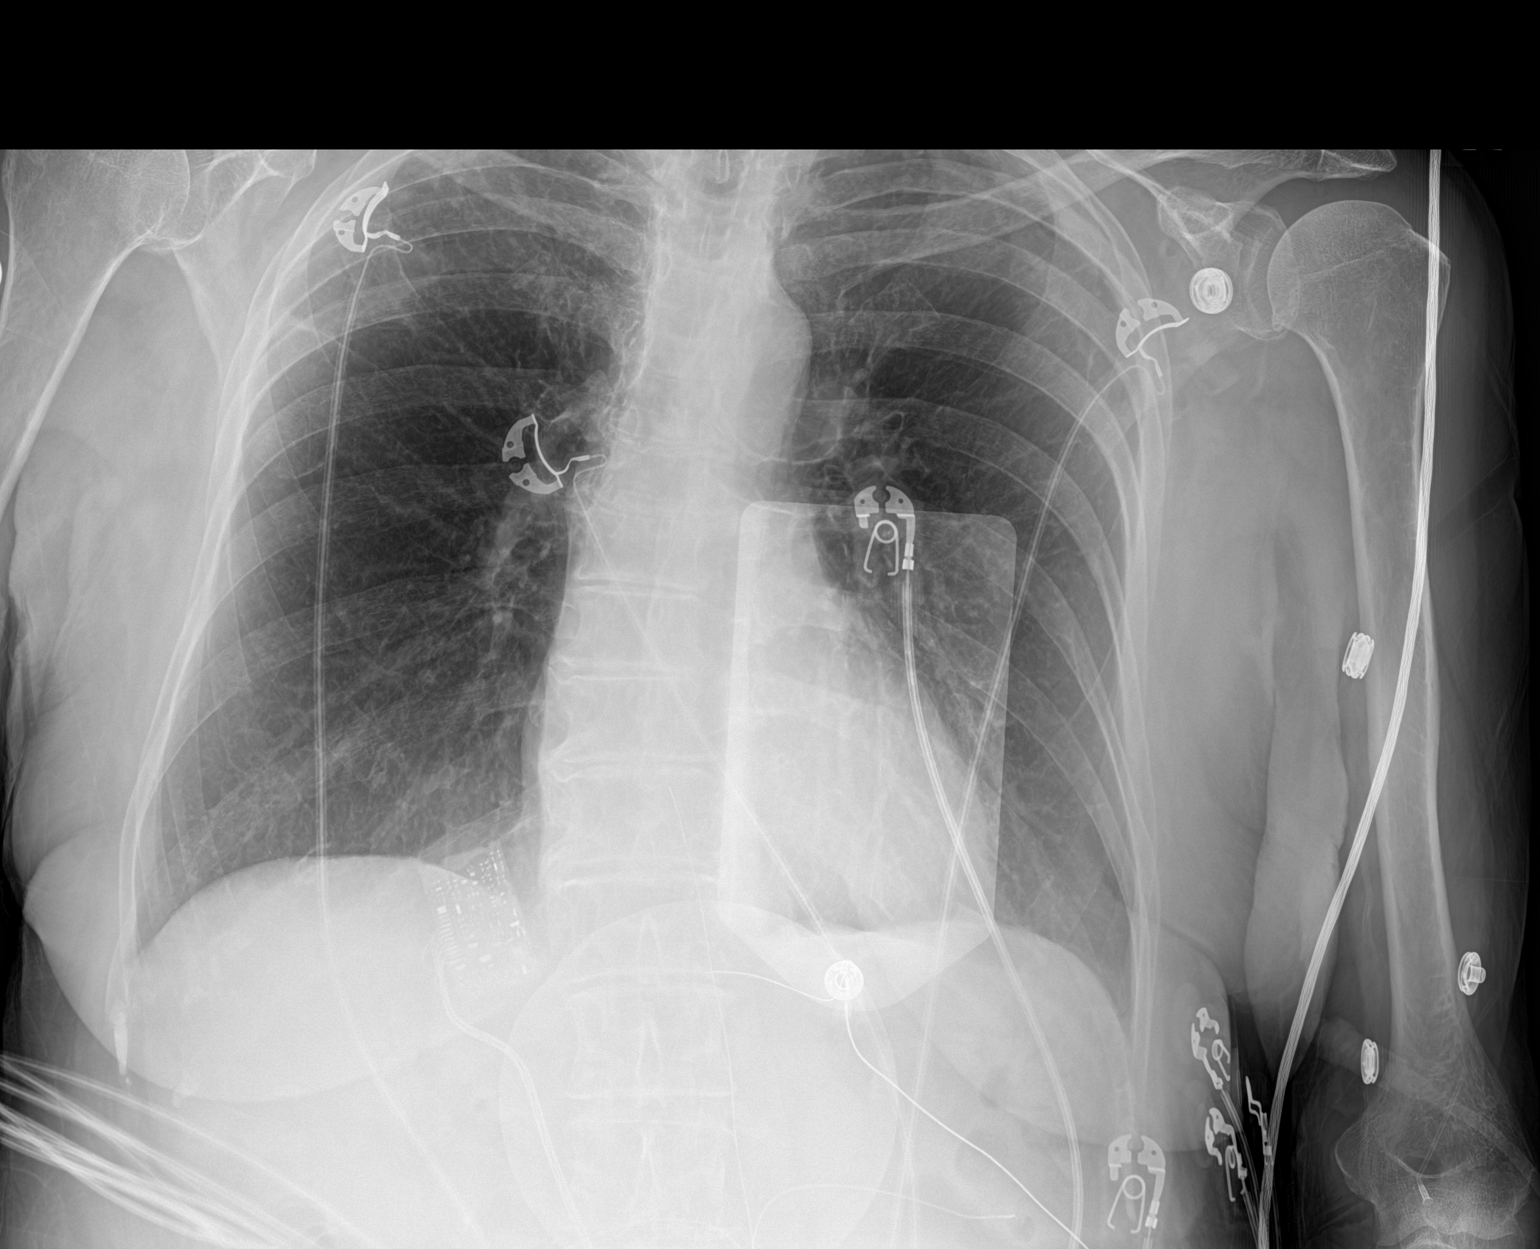

[1 of 1 positions shown; findings below may reference images not displayed]

FINDINGS: Transcutaneous defibrillator pad projecting over the lower left
hemithorax. Lung volumes are normal. No consolidative airspace
disease. No pleural effusions. No pneumothorax. No pulmonary nodule
or mass noted. Pulmonary vasculature and the cardiomediastinal
silhouette are within normal limits.
IMPRESSION: 1.  No radiographic evidence of acute cardiopulmonary disease.

## 2021-08-07 IMAGING — DX DG CHEST 2V
2 series · 2 of 2 positions shown · non-contrast
Comparison: November 08, 2019.

CLINICAL DATA: Cardiac device in situ.

EXAM:
CHEST - 2 VIEW

[chest lat]
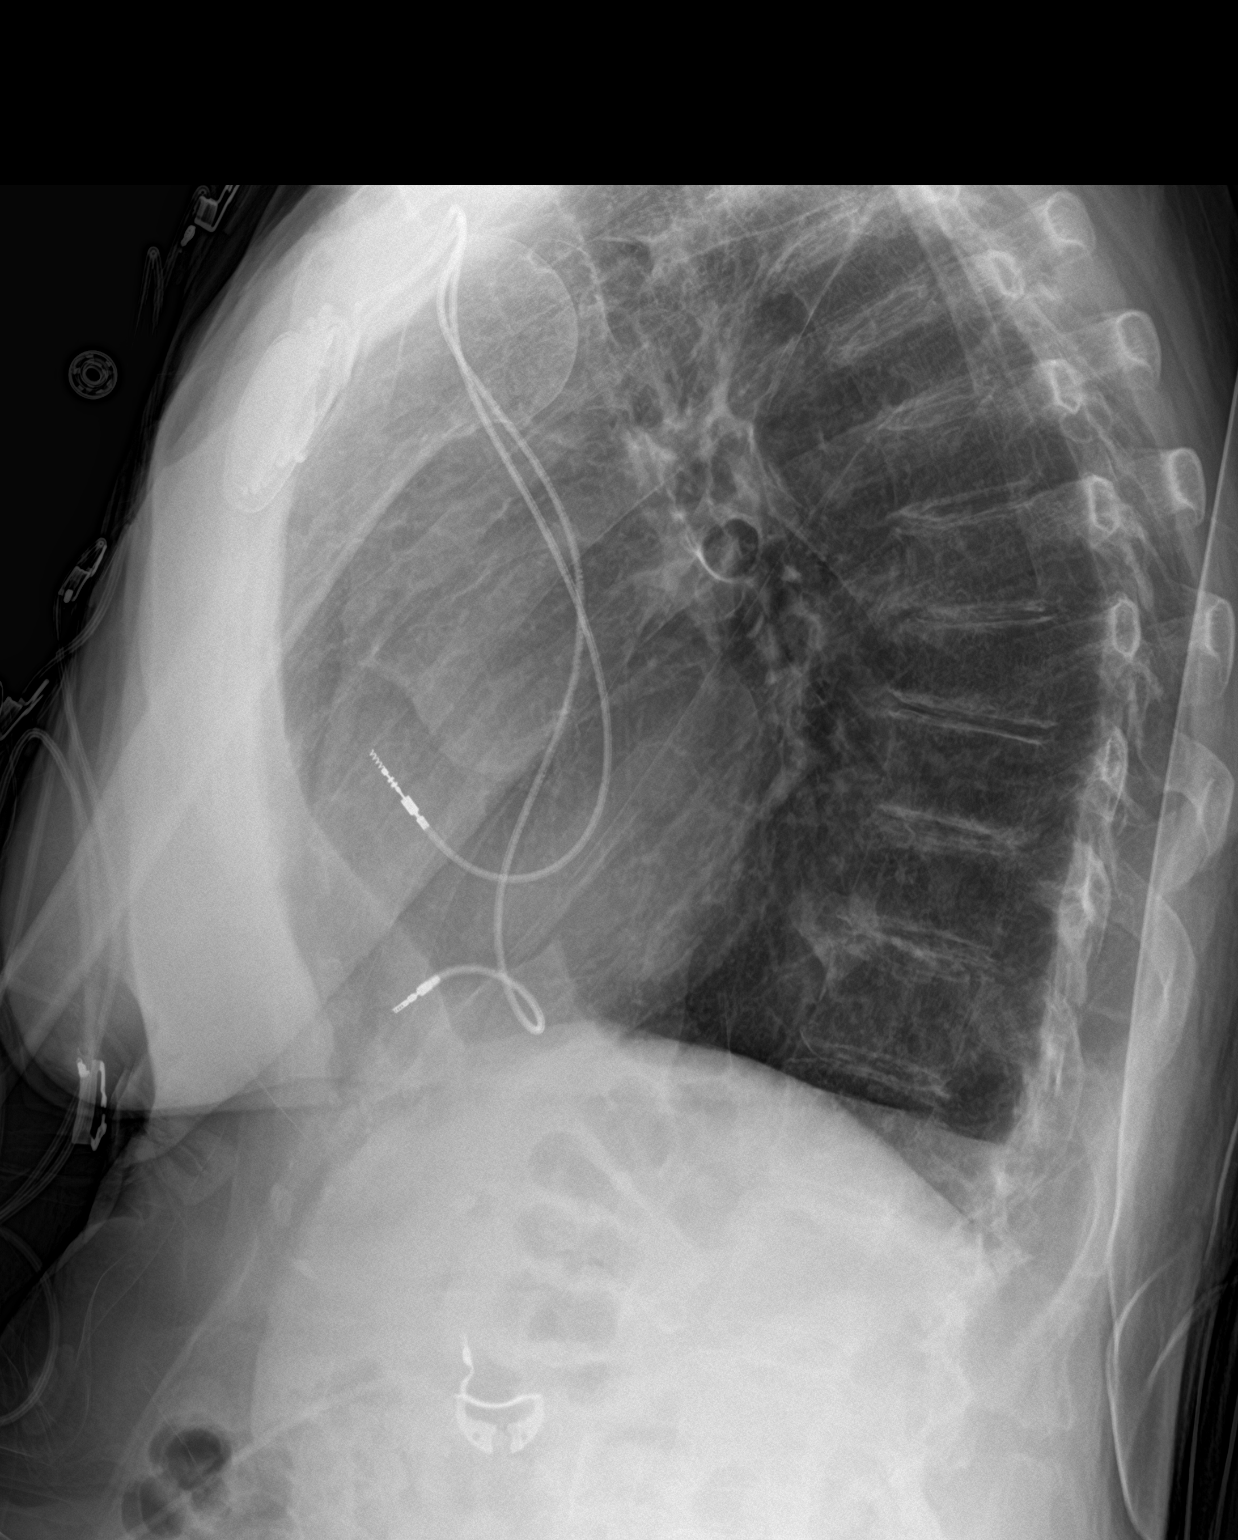

[chest ap]
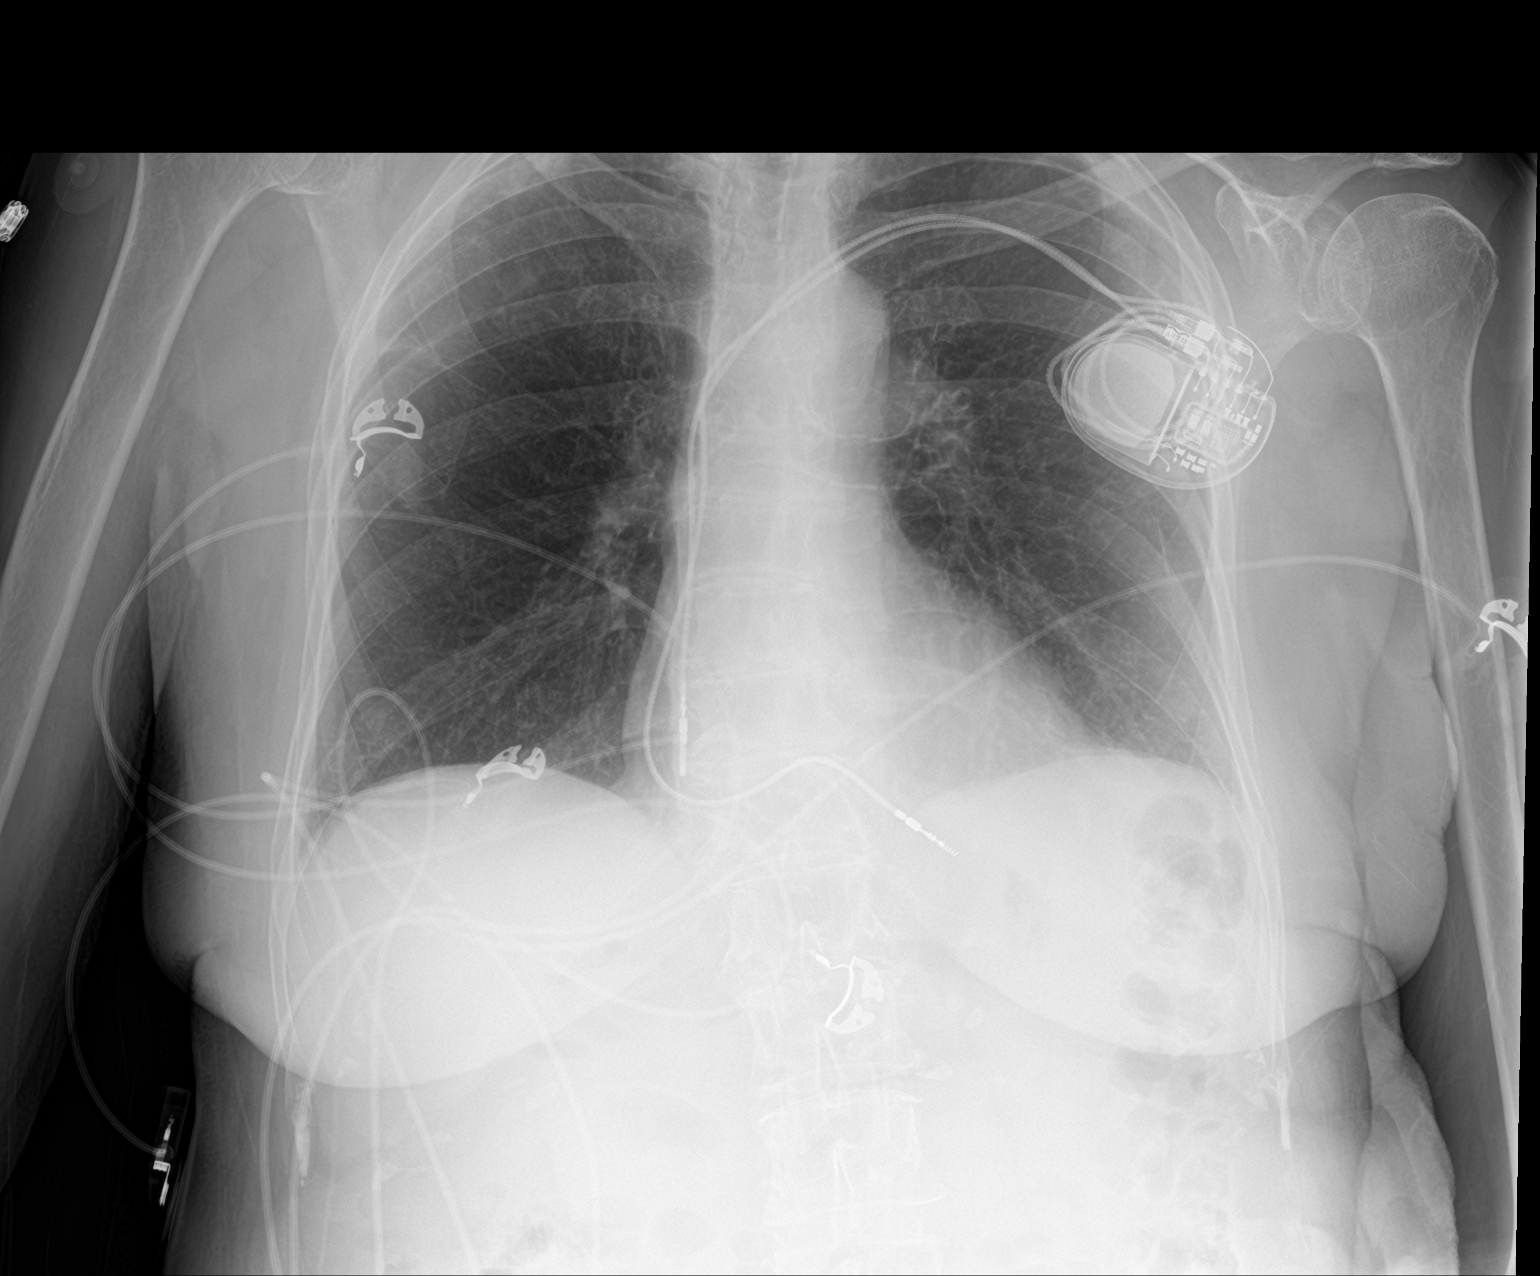

[2 of 2 positions shown; findings below may reference images not displayed]

FINDINGS: The heart size and mediastinal contours are within normal limits.
Both lungs are clear. Interval placement of left-sided pacemaker
with leads in grossly good position. No pneumothorax or pleural
effusion is noted. The visualized skeletal structures are
unremarkable.
IMPRESSION: No active cardiopulmonary disease.

## 2021-08-09 ENCOUNTER — Ambulatory Visit (INDEPENDENT_AMBULATORY_CARE_PROVIDER_SITE_OTHER): Payer: Medicare Other

## 2021-08-09 DIAGNOSIS — I441 Atrioventricular block, second degree: Secondary | ICD-10-CM

## 2021-08-09 LAB — CUP PACEART REMOTE DEVICE CHECK
Battery Remaining Longevity: 152 mo
Battery Voltage: 3.04 V
Brady Statistic AP VP Percent: 1.88 %
Brady Statistic AP VS Percent: 0.8 %
Brady Statistic AS VP Percent: 13.31 %
Brady Statistic AS VS Percent: 84.01 %
Brady Statistic RA Percent Paced: 2.74 %
Brady Statistic RV Percent Paced: 15.19 %
Date Time Interrogation Session: 20230523233616
Implantable Lead Implant Date: 20210823
Implantable Lead Implant Date: 20210823
Implantable Lead Location: 753859
Implantable Lead Location: 753860
Implantable Lead Model: 5076
Implantable Lead Model: 5076
Implantable Pulse Generator Implant Date: 20210823
Lead Channel Impedance Value: 323 Ohm
Lead Channel Impedance Value: 361 Ohm
Lead Channel Impedance Value: 494 Ohm
Lead Channel Impedance Value: 551 Ohm
Lead Channel Pacing Threshold Amplitude: 0.375 V
Lead Channel Pacing Threshold Amplitude: 0.5 V
Lead Channel Pacing Threshold Pulse Width: 0.4 ms
Lead Channel Pacing Threshold Pulse Width: 0.4 ms
Lead Channel Sensing Intrinsic Amplitude: 15.125 mV
Lead Channel Sensing Intrinsic Amplitude: 15.125 mV
Lead Channel Sensing Intrinsic Amplitude: 2.375 mV
Lead Channel Sensing Intrinsic Amplitude: 2.375 mV
Lead Channel Setting Pacing Amplitude: 2 V
Lead Channel Setting Pacing Amplitude: 2.5 V
Lead Channel Setting Pacing Pulse Width: 0.4 ms
Lead Channel Setting Sensing Sensitivity: 0.9 mV

## 2021-08-22 NOTE — Progress Notes (Signed)
Remote pacemaker transmission.   

## 2021-09-13 DIAGNOSIS — R413 Other amnesia: Secondary | ICD-10-CM | POA: Diagnosis not present

## 2021-09-13 DIAGNOSIS — E559 Vitamin D deficiency, unspecified: Secondary | ICD-10-CM | POA: Diagnosis not present

## 2021-09-13 DIAGNOSIS — R7303 Prediabetes: Secondary | ICD-10-CM | POA: Diagnosis not present

## 2021-09-13 DIAGNOSIS — I10 Essential (primary) hypertension: Secondary | ICD-10-CM | POA: Diagnosis not present

## 2021-09-13 DIAGNOSIS — Z950 Presence of cardiac pacemaker: Secondary | ICD-10-CM | POA: Diagnosis not present

## 2021-09-13 DIAGNOSIS — M810 Age-related osteoporosis without current pathological fracture: Secondary | ICD-10-CM | POA: Diagnosis not present

## 2021-09-13 DIAGNOSIS — E782 Mixed hyperlipidemia: Secondary | ICD-10-CM | POA: Diagnosis not present

## 2021-11-08 ENCOUNTER — Ambulatory Visit (INDEPENDENT_AMBULATORY_CARE_PROVIDER_SITE_OTHER): Payer: Medicare Other

## 2021-11-08 DIAGNOSIS — I441 Atrioventricular block, second degree: Secondary | ICD-10-CM

## 2021-11-09 LAB — CUP PACEART REMOTE DEVICE CHECK
Battery Remaining Longevity: 146 mo
Battery Voltage: 3.02 V
Brady Statistic AP VP Percent: 13.11 %
Brady Statistic AP VS Percent: 2.59 %
Brady Statistic AS VP Percent: 24.7 %
Brady Statistic AS VS Percent: 59.6 %
Brady Statistic RA Percent Paced: 15.79 %
Brady Statistic RV Percent Paced: 37.81 %
Date Time Interrogation Session: 20230822220050
Implantable Lead Implant Date: 20210823
Implantable Lead Implant Date: 20210823
Implantable Lead Location: 753859
Implantable Lead Location: 753860
Implantable Lead Model: 5076
Implantable Lead Model: 5076
Implantable Pulse Generator Implant Date: 20210823
Lead Channel Impedance Value: 323 Ohm
Lead Channel Impedance Value: 361 Ohm
Lead Channel Impedance Value: 418 Ohm
Lead Channel Impedance Value: 475 Ohm
Lead Channel Pacing Threshold Amplitude: 0.375 V
Lead Channel Pacing Threshold Amplitude: 0.5 V
Lead Channel Pacing Threshold Pulse Width: 0.4 ms
Lead Channel Pacing Threshold Pulse Width: 0.4 ms
Lead Channel Sensing Intrinsic Amplitude: 14.625 mV
Lead Channel Sensing Intrinsic Amplitude: 14.625 mV
Lead Channel Sensing Intrinsic Amplitude: 2.5 mV
Lead Channel Sensing Intrinsic Amplitude: 2.5 mV
Lead Channel Setting Pacing Amplitude: 2 V
Lead Channel Setting Pacing Amplitude: 2.5 V
Lead Channel Setting Pacing Pulse Width: 0.4 ms
Lead Channel Setting Sensing Sensitivity: 0.9 mV

## 2021-12-05 NOTE — Progress Notes (Signed)
Remote pacemaker transmission.   

## 2021-12-15 DIAGNOSIS — Z950 Presence of cardiac pacemaker: Secondary | ICD-10-CM | POA: Diagnosis not present

## 2021-12-15 DIAGNOSIS — E782 Mixed hyperlipidemia: Secondary | ICD-10-CM | POA: Diagnosis not present

## 2021-12-15 DIAGNOSIS — M810 Age-related osteoporosis without current pathological fracture: Secondary | ICD-10-CM | POA: Diagnosis not present

## 2021-12-15 DIAGNOSIS — E559 Vitamin D deficiency, unspecified: Secondary | ICD-10-CM | POA: Diagnosis not present

## 2021-12-15 DIAGNOSIS — I10 Essential (primary) hypertension: Secondary | ICD-10-CM | POA: Diagnosis not present

## 2021-12-15 DIAGNOSIS — R7303 Prediabetes: Secondary | ICD-10-CM | POA: Diagnosis not present

## 2021-12-15 DIAGNOSIS — R413 Other amnesia: Secondary | ICD-10-CM | POA: Diagnosis not present

## 2021-12-15 DIAGNOSIS — J309 Allergic rhinitis, unspecified: Secondary | ICD-10-CM | POA: Diagnosis not present

## 2021-12-15 DIAGNOSIS — R350 Frequency of micturition: Secondary | ICD-10-CM | POA: Diagnosis not present

## 2021-12-26 DIAGNOSIS — M810 Age-related osteoporosis without current pathological fracture: Secondary | ICD-10-CM | POA: Diagnosis not present

## 2021-12-26 DIAGNOSIS — E782 Mixed hyperlipidemia: Secondary | ICD-10-CM | POA: Diagnosis not present

## 2021-12-26 DIAGNOSIS — I10 Essential (primary) hypertension: Secondary | ICD-10-CM | POA: Diagnosis not present

## 2022-02-07 ENCOUNTER — Ambulatory Visit (INDEPENDENT_AMBULATORY_CARE_PROVIDER_SITE_OTHER): Payer: Medicare Other

## 2022-02-07 DIAGNOSIS — I441 Atrioventricular block, second degree: Secondary | ICD-10-CM

## 2022-02-07 DIAGNOSIS — Z950 Presence of cardiac pacemaker: Secondary | ICD-10-CM

## 2022-02-07 LAB — CUP PACEART REMOTE DEVICE CHECK
Battery Remaining Longevity: 142 mo
Battery Voltage: 3.03 V
Brady Statistic AP VP Percent: 14.28 %
Brady Statistic AP VS Percent: 3.3 %
Brady Statistic AS VP Percent: 11.07 %
Brady Statistic AS VS Percent: 71.35 %
Brady Statistic RA Percent Paced: 17.68 %
Brady Statistic RV Percent Paced: 25.35 %
Date Time Interrogation Session: 20231121203120
Implantable Lead Connection Status: 753985
Implantable Lead Connection Status: 753985
Implantable Lead Implant Date: 20210823
Implantable Lead Implant Date: 20210823
Implantable Lead Location: 753859
Implantable Lead Location: 753860
Implantable Lead Model: 5076
Implantable Lead Model: 5076
Implantable Pulse Generator Implant Date: 20210823
Lead Channel Impedance Value: 304 Ohm
Lead Channel Impedance Value: 361 Ohm
Lead Channel Impedance Value: 380 Ohm
Lead Channel Impedance Value: 437 Ohm
Lead Channel Pacing Threshold Amplitude: 0.375 V
Lead Channel Pacing Threshold Amplitude: 0.5 V
Lead Channel Pacing Threshold Pulse Width: 0.4 ms
Lead Channel Pacing Threshold Pulse Width: 0.4 ms
Lead Channel Sensing Intrinsic Amplitude: 17.125 mV
Lead Channel Sensing Intrinsic Amplitude: 17.125 mV
Lead Channel Sensing Intrinsic Amplitude: 2.375 mV
Lead Channel Sensing Intrinsic Amplitude: 2.375 mV
Lead Channel Setting Pacing Amplitude: 2 V
Lead Channel Setting Pacing Amplitude: 2.5 V
Lead Channel Setting Pacing Pulse Width: 0.4 ms
Lead Channel Setting Sensing Sensitivity: 0.9 mV
Zone Setting Status: 755011

## 2022-03-01 NOTE — Progress Notes (Signed)
Remote pacemaker transmission.   

## 2022-04-25 ENCOUNTER — Other Ambulatory Visit: Payer: Self-pay | Admitting: Internal Medicine

## 2022-04-25 DIAGNOSIS — E559 Vitamin D deficiency, unspecified: Secondary | ICD-10-CM | POA: Diagnosis not present

## 2022-04-25 DIAGNOSIS — Z5181 Encounter for therapeutic drug level monitoring: Secondary | ICD-10-CM | POA: Diagnosis not present

## 2022-04-25 DIAGNOSIS — M810 Age-related osteoporosis without current pathological fracture: Secondary | ICD-10-CM

## 2022-04-25 DIAGNOSIS — E782 Mixed hyperlipidemia: Secondary | ICD-10-CM | POA: Diagnosis not present

## 2022-04-25 DIAGNOSIS — Z950 Presence of cardiac pacemaker: Secondary | ICD-10-CM | POA: Diagnosis not present

## 2022-04-25 DIAGNOSIS — R7303 Prediabetes: Secondary | ICD-10-CM | POA: Diagnosis not present

## 2022-04-25 DIAGNOSIS — I10 Essential (primary) hypertension: Secondary | ICD-10-CM | POA: Diagnosis not present

## 2022-04-25 DIAGNOSIS — Z0000 Encounter for general adult medical examination without abnormal findings: Secondary | ICD-10-CM | POA: Diagnosis not present

## 2022-05-09 ENCOUNTER — Ambulatory Visit (INDEPENDENT_AMBULATORY_CARE_PROVIDER_SITE_OTHER): Payer: Medicare Other

## 2022-05-09 DIAGNOSIS — I441 Atrioventricular block, second degree: Secondary | ICD-10-CM

## 2022-05-09 LAB — CUP PACEART REMOTE DEVICE CHECK
Battery Remaining Longevity: 140 mo
Battery Voltage: 3.03 V
Brady Statistic AP VP Percent: 5.43 %
Brady Statistic AP VS Percent: 2.88 %
Brady Statistic AS VP Percent: 9.86 %
Brady Statistic AS VS Percent: 81.83 %
Brady Statistic RA Percent Paced: 8.49 %
Brady Statistic RV Percent Paced: 15.29 %
Date Time Interrogation Session: 20240221005920
Implantable Lead Connection Status: 753985
Implantable Lead Connection Status: 753985
Implantable Lead Implant Date: 20210823
Implantable Lead Implant Date: 20210823
Implantable Lead Location: 753859
Implantable Lead Location: 753860
Implantable Lead Model: 5076
Implantable Lead Model: 5076
Implantable Pulse Generator Implant Date: 20210823
Lead Channel Impedance Value: 323 Ohm
Lead Channel Impedance Value: 361 Ohm
Lead Channel Impedance Value: 380 Ohm
Lead Channel Impedance Value: 437 Ohm
Lead Channel Pacing Threshold Amplitude: 0.375 V
Lead Channel Pacing Threshold Amplitude: 0.5 V
Lead Channel Pacing Threshold Pulse Width: 0.4 ms
Lead Channel Pacing Threshold Pulse Width: 0.4 ms
Lead Channel Sensing Intrinsic Amplitude: 12.375 mV
Lead Channel Sensing Intrinsic Amplitude: 12.375 mV
Lead Channel Sensing Intrinsic Amplitude: 2.375 mV
Lead Channel Sensing Intrinsic Amplitude: 2.375 mV
Lead Channel Setting Pacing Amplitude: 2 V
Lead Channel Setting Pacing Amplitude: 2.5 V
Lead Channel Setting Pacing Pulse Width: 0.4 ms
Lead Channel Setting Sensing Sensitivity: 0.9 mV
Zone Setting Status: 755011

## 2022-06-07 NOTE — Progress Notes (Signed)
Remote pacemaker transmission.   

## 2022-08-08 ENCOUNTER — Ambulatory Visit (INDEPENDENT_AMBULATORY_CARE_PROVIDER_SITE_OTHER): Payer: Medicare Other

## 2022-08-08 DIAGNOSIS — I441 Atrioventricular block, second degree: Secondary | ICD-10-CM | POA: Diagnosis not present

## 2022-08-08 LAB — CUP PACEART REMOTE DEVICE CHECK
Battery Remaining Longevity: 138 mo
Battery Voltage: 3.03 V
Brady Statistic AP VP Percent: 2.95 %
Brady Statistic AP VS Percent: 1.28 %
Brady Statistic AS VP Percent: 11.21 %
Brady Statistic AS VS Percent: 84.56 %
Brady Statistic RA Percent Paced: 4.41 %
Brady Statistic RV Percent Paced: 14.16 %
Date Time Interrogation Session: 20240521210831
Implantable Lead Connection Status: 753985
Implantable Lead Connection Status: 753985
Implantable Lead Implant Date: 20210823
Implantable Lead Implant Date: 20210823
Implantable Lead Location: 753859
Implantable Lead Location: 753860
Implantable Lead Model: 5076
Implantable Lead Model: 5076
Implantable Pulse Generator Implant Date: 20210823
Lead Channel Impedance Value: 323 Ohm
Lead Channel Impedance Value: 361 Ohm
Lead Channel Impedance Value: 399 Ohm
Lead Channel Impedance Value: 456 Ohm
Lead Channel Pacing Threshold Amplitude: 0.375 V
Lead Channel Pacing Threshold Amplitude: 0.5 V
Lead Channel Pacing Threshold Pulse Width: 0.4 ms
Lead Channel Pacing Threshold Pulse Width: 0.4 ms
Lead Channel Sensing Intrinsic Amplitude: 12.625 mV
Lead Channel Sensing Intrinsic Amplitude: 12.625 mV
Lead Channel Sensing Intrinsic Amplitude: 2.375 mV
Lead Channel Sensing Intrinsic Amplitude: 2.375 mV
Lead Channel Setting Pacing Amplitude: 2 V
Lead Channel Setting Pacing Amplitude: 2.5 V
Lead Channel Setting Pacing Pulse Width: 0.4 ms
Lead Channel Setting Sensing Sensitivity: 0.9 mV
Zone Setting Status: 755011

## 2022-08-14 ENCOUNTER — Inpatient Hospital Stay
Admission: EM | Admit: 2022-08-14 | Discharge: 2022-08-20 | DRG: 445 | Disposition: A | Discharge status: Skilled Nursing Facility | Payer: Medicare Other | Attending: Obstetrics and Gynecology | Admitting: Obstetrics and Gynecology

## 2022-08-14 ENCOUNTER — Encounter: Payer: Self-pay | Admitting: Emergency Medicine

## 2022-08-14 DIAGNOSIS — I4510 Unspecified right bundle-branch block: Secondary | ICD-10-CM | POA: Diagnosis present

## 2022-08-14 DIAGNOSIS — Z4889 Encounter for other specified surgical aftercare: Secondary | ICD-10-CM | POA: Diagnosis not present

## 2022-08-14 DIAGNOSIS — R2681 Unsteadiness on feet: Secondary | ICD-10-CM | POA: Diagnosis not present

## 2022-08-14 DIAGNOSIS — Z950 Presence of cardiac pacemaker: Secondary | ICD-10-CM

## 2022-08-14 DIAGNOSIS — K8000 Calculus of gallbladder with acute cholecystitis without obstruction: Principal | ICD-10-CM | POA: Diagnosis present

## 2022-08-14 DIAGNOSIS — I959 Hypotension, unspecified: Secondary | ICD-10-CM | POA: Diagnosis not present

## 2022-08-14 DIAGNOSIS — Z885 Allergy status to narcotic agent status: Secondary | ICD-10-CM

## 2022-08-14 DIAGNOSIS — Z4803 Encounter for change or removal of drains: Secondary | ICD-10-CM | POA: Diagnosis not present

## 2022-08-14 DIAGNOSIS — W19XXXA Unspecified fall, initial encounter: Secondary | ICD-10-CM | POA: Diagnosis not present

## 2022-08-14 DIAGNOSIS — I2489 Other forms of acute ischemic heart disease: Secondary | ICD-10-CM | POA: Diagnosis present

## 2022-08-14 DIAGNOSIS — E860 Dehydration: Secondary | ICD-10-CM | POA: Diagnosis not present

## 2022-08-14 DIAGNOSIS — I441 Atrioventricular block, second degree: Secondary | ICD-10-CM | POA: Diagnosis present

## 2022-08-14 DIAGNOSIS — K810 Acute cholecystitis: Secondary | ICD-10-CM | POA: Diagnosis not present

## 2022-08-14 DIAGNOSIS — Z801 Family history of malignant neoplasm of trachea, bronchus and lung: Secondary | ICD-10-CM | POA: Diagnosis not present

## 2022-08-14 DIAGNOSIS — Z79899 Other long term (current) drug therapy: Secondary | ICD-10-CM | POA: Diagnosis not present

## 2022-08-14 DIAGNOSIS — N3090 Cystitis, unspecified without hematuria: Secondary | ICD-10-CM | POA: Diagnosis present

## 2022-08-14 DIAGNOSIS — R5381 Other malaise: Secondary | ICD-10-CM | POA: Diagnosis present

## 2022-08-14 DIAGNOSIS — Z803 Family history of malignant neoplasm of breast: Secondary | ICD-10-CM | POA: Diagnosis not present

## 2022-08-14 DIAGNOSIS — Z4689 Encounter for fitting and adjustment of other specified devices: Secondary | ICD-10-CM | POA: Diagnosis not present

## 2022-08-14 DIAGNOSIS — R52 Pain, unspecified: Secondary | ICD-10-CM | POA: Diagnosis not present

## 2022-08-14 DIAGNOSIS — K819 Cholecystitis, unspecified: Principal | ICD-10-CM

## 2022-08-14 DIAGNOSIS — Z7982 Long term (current) use of aspirin: Secondary | ICD-10-CM

## 2022-08-14 DIAGNOSIS — Z888 Allergy status to other drugs, medicaments and biological substances status: Secondary | ICD-10-CM

## 2022-08-14 DIAGNOSIS — R112 Nausea with vomiting, unspecified: Secondary | ICD-10-CM | POA: Diagnosis not present

## 2022-08-14 DIAGNOSIS — Z7401 Bed confinement status: Secondary | ICD-10-CM | POA: Diagnosis not present

## 2022-08-14 DIAGNOSIS — Z833 Family history of diabetes mellitus: Secondary | ICD-10-CM | POA: Diagnosis not present

## 2022-08-14 DIAGNOSIS — K8020 Calculus of gallbladder without cholecystitis without obstruction: Secondary | ICD-10-CM | POA: Diagnosis not present

## 2022-08-14 DIAGNOSIS — T7840XD Allergy, unspecified, subsequent encounter: Secondary | ICD-10-CM | POA: Diagnosis not present

## 2022-08-14 DIAGNOSIS — E559 Vitamin D deficiency, unspecified: Secondary | ICD-10-CM | POA: Diagnosis not present

## 2022-08-14 DIAGNOSIS — M810 Age-related osteoporosis without current pathological fracture: Secondary | ICD-10-CM | POA: Diagnosis not present

## 2022-08-14 DIAGNOSIS — F0390 Unspecified dementia without behavioral disturbance: Secondary | ICD-10-CM | POA: Diagnosis present

## 2022-08-14 DIAGNOSIS — R509 Fever, unspecified: Secondary | ICD-10-CM | POA: Diagnosis not present

## 2022-08-14 DIAGNOSIS — R278 Other lack of coordination: Secondary | ICD-10-CM | POA: Diagnosis not present

## 2022-08-14 DIAGNOSIS — Z434 Encounter for attention to other artificial openings of digestive tract: Secondary | ICD-10-CM | POA: Diagnosis not present

## 2022-08-14 DIAGNOSIS — M6281 Muscle weakness (generalized): Secondary | ICD-10-CM | POA: Diagnosis not present

## 2022-08-14 DIAGNOSIS — R110 Nausea: Secondary | ICD-10-CM | POA: Diagnosis not present

## 2022-08-14 DIAGNOSIS — I10 Essential (primary) hypertension: Secondary | ICD-10-CM | POA: Diagnosis present

## 2022-08-14 DIAGNOSIS — R7989 Other specified abnormal findings of blood chemistry: Secondary | ICD-10-CM

## 2022-08-14 LAB — TROPONIN I (HIGH SENSITIVITY): Troponin I (High Sensitivity): 73 ng/L — ABNORMAL HIGH (ref <18)

## 2022-08-14 LAB — CBC
HCT: 34.8 % — ABNORMAL LOW (ref 36.0–46.0)
Hemoglobin: 11.7 g/dL — ABNORMAL LOW (ref 12.0–15.0)
MCH: 31 pg (ref 26.0–34.0)
MCHC: 33.6 g/dL (ref 30.0–36.0)
MCV: 92.3 fL (ref 80.0–100.0)
Platelets: 218 10*3/uL (ref 150–400)
RBC: 3.77 MIL/uL — ABNORMAL LOW (ref 3.87–5.11)
RDW: 13.2 % (ref 11.5–15.5)
WBC: 26.1 10*3/uL — ABNORMAL HIGH (ref 4.0–10.5)
nRBC: 0 % (ref 0.0–0.2)

## 2022-08-14 NOTE — ED Provider Triage Note (Signed)
Emergency Medicine Provider Triage Evaluation Note  Tammy Dillon, a 88 y.o. female  was evaluated in triage.  Pt complains of weakness, N/V, and decreased oral intake x 3 days. She presents via EMS from home. Family report increased confusion x 24 hours. Concern for UTI.   Review of Systems  Positive: AMS, weakness Negative: FCS  Physical Exam  BP 134/63 (BP Location: Left Arm)   Pulse 91   Temp 99.6 F (37.6 C) (Oral)   Resp 18   Ht 4\' 11"  (1.499 m)   Wt 50 kg   SpO2 99%   BMI 22.26 kg/m  Gen:   Awake, no distress  NAD Resp:  Normal effort  MSK:   Moves extremities without difficulty  Other:    Medical Decision Making  Medically screening exam initiated at 11:36 PM.  Appropriate orders placed.  Tammy Dillon was informed that the remainder of the evaluation will be completed by another provider, this initial triage assessment does not replace that evaluation, and the importance of remaining in the ED until their evaluation is complete.  Geriatric patient to the ED for evaluation of AMS, weakness, and NV.    Lissa Hoard, PA-C 08/14/22 2338

## 2022-08-14 NOTE — ED Notes (Signed)
PT brought to ed rm 4 at this time, this RN now assuming care.

## 2022-08-14 NOTE — ED Triage Notes (Addendum)
Pt arrived via [facility] from home with c/o weakness, N/V and decreased oral intake x3 days. Pt with hx/o chronic UTI's. Pt denies urinary symptoms. Family reports increased confusion over last 24 hours.

## 2022-08-14 NOTE — ED Triage Notes (Signed)
EMS brings pt in from home for odorous urine, "not feeling well", abd pain with nausea

## 2022-08-15 ENCOUNTER — Emergency Department: Payer: Medicare Other

## 2022-08-15 ENCOUNTER — Inpatient Hospital Stay: Payer: Medicare Other | Admitting: Radiology

## 2022-08-15 ENCOUNTER — Other Ambulatory Visit: Payer: Self-pay

## 2022-08-15 DIAGNOSIS — M810 Age-related osteoporosis without current pathological fracture: Secondary | ICD-10-CM | POA: Diagnosis not present

## 2022-08-15 DIAGNOSIS — Z803 Family history of malignant neoplasm of breast: Secondary | ICD-10-CM | POA: Diagnosis not present

## 2022-08-15 DIAGNOSIS — I441 Atrioventricular block, second degree: Secondary | ICD-10-CM | POA: Diagnosis present

## 2022-08-15 DIAGNOSIS — R2681 Unsteadiness on feet: Secondary | ICD-10-CM | POA: Diagnosis not present

## 2022-08-15 DIAGNOSIS — Z801 Family history of malignant neoplasm of trachea, bronchus and lung: Secondary | ICD-10-CM | POA: Diagnosis not present

## 2022-08-15 DIAGNOSIS — Z4889 Encounter for other specified surgical aftercare: Secondary | ICD-10-CM | POA: Diagnosis not present

## 2022-08-15 DIAGNOSIS — R278 Other lack of coordination: Secondary | ICD-10-CM | POA: Diagnosis not present

## 2022-08-15 DIAGNOSIS — Z79899 Other long term (current) drug therapy: Secondary | ICD-10-CM | POA: Diagnosis not present

## 2022-08-15 DIAGNOSIS — I10 Essential (primary) hypertension: Secondary | ICD-10-CM | POA: Diagnosis present

## 2022-08-15 DIAGNOSIS — I4510 Unspecified right bundle-branch block: Secondary | ICD-10-CM | POA: Diagnosis present

## 2022-08-15 DIAGNOSIS — Z7982 Long term (current) use of aspirin: Secondary | ICD-10-CM | POA: Diagnosis not present

## 2022-08-15 DIAGNOSIS — Z4689 Encounter for fitting and adjustment of other specified devices: Secondary | ICD-10-CM | POA: Diagnosis not present

## 2022-08-15 DIAGNOSIS — R7989 Other specified abnormal findings of blood chemistry: Secondary | ICD-10-CM

## 2022-08-15 DIAGNOSIS — E860 Dehydration: Secondary | ICD-10-CM | POA: Diagnosis present

## 2022-08-15 DIAGNOSIS — R112 Nausea with vomiting, unspecified: Secondary | ICD-10-CM | POA: Diagnosis not present

## 2022-08-15 DIAGNOSIS — Z888 Allergy status to other drugs, medicaments and biological substances status: Secondary | ICD-10-CM | POA: Diagnosis not present

## 2022-08-15 DIAGNOSIS — K8000 Calculus of gallbladder with acute cholecystitis without obstruction: Secondary | ICD-10-CM | POA: Diagnosis present

## 2022-08-15 DIAGNOSIS — M6281 Muscle weakness (generalized): Secondary | ICD-10-CM | POA: Diagnosis not present

## 2022-08-15 DIAGNOSIS — K8020 Calculus of gallbladder without cholecystitis without obstruction: Secondary | ICD-10-CM | POA: Diagnosis not present

## 2022-08-15 DIAGNOSIS — F0390 Unspecified dementia without behavioral disturbance: Secondary | ICD-10-CM | POA: Diagnosis present

## 2022-08-15 DIAGNOSIS — T7840XD Allergy, unspecified, subsequent encounter: Secondary | ICD-10-CM | POA: Diagnosis not present

## 2022-08-15 DIAGNOSIS — I2489 Other forms of acute ischemic heart disease: Secondary | ICD-10-CM | POA: Diagnosis present

## 2022-08-15 DIAGNOSIS — Z434 Encounter for attention to other artificial openings of digestive tract: Secondary | ICD-10-CM | POA: Diagnosis not present

## 2022-08-15 DIAGNOSIS — Z4803 Encounter for change or removal of drains: Secondary | ICD-10-CM | POA: Diagnosis not present

## 2022-08-15 DIAGNOSIS — Z950 Presence of cardiac pacemaker: Secondary | ICD-10-CM

## 2022-08-15 DIAGNOSIS — K810 Acute cholecystitis: Secondary | ICD-10-CM | POA: Diagnosis present

## 2022-08-15 DIAGNOSIS — R5381 Other malaise: Secondary | ICD-10-CM | POA: Diagnosis present

## 2022-08-15 DIAGNOSIS — Z7401 Bed confinement status: Secondary | ICD-10-CM | POA: Diagnosis not present

## 2022-08-15 DIAGNOSIS — E559 Vitamin D deficiency, unspecified: Secondary | ICD-10-CM | POA: Diagnosis not present

## 2022-08-15 DIAGNOSIS — R52 Pain, unspecified: Secondary | ICD-10-CM | POA: Diagnosis not present

## 2022-08-15 DIAGNOSIS — N3090 Cystitis, unspecified without hematuria: Secondary | ICD-10-CM | POA: Diagnosis present

## 2022-08-15 DIAGNOSIS — Z885 Allergy status to narcotic agent status: Secondary | ICD-10-CM | POA: Diagnosis not present

## 2022-08-15 DIAGNOSIS — Z833 Family history of diabetes mellitus: Secondary | ICD-10-CM | POA: Diagnosis not present

## 2022-08-15 HISTORY — PX: IR PERC CHOLECYSTOSTOMY: IMG2326

## 2022-08-15 LAB — URINALYSIS, ROUTINE W REFLEX MICROSCOPIC
Bilirubin Urine: NEGATIVE
Glucose, UA: NEGATIVE mg/dL
Hgb urine dipstick: NEGATIVE
Ketones, ur: 5 mg/dL — AB
Nitrite: NEGATIVE
Protein, ur: 30 mg/dL — AB
Specific Gravity, Urine: 1.019 (ref 1.005–1.030)
pH: 5 (ref 5.0–8.0)

## 2022-08-15 LAB — COMPREHENSIVE METABOLIC PANEL
ALT: 14 U/L (ref 0–44)
AST: 30 U/L (ref 15–41)
Albumin: 3.9 g/dL (ref 3.5–5.0)
Alkaline Phosphatase: 47 U/L (ref 38–126)
Anion gap: 9 (ref 5–15)
BUN: 22 mg/dL (ref 8–23)
CO2: 25 mmol/L (ref 22–32)
Calcium: 9.3 mg/dL (ref 8.9–10.3)
Chloride: 99 mmol/L (ref 98–111)
Creatinine, Ser: 0.84 mg/dL (ref 0.44–1.00)
GFR, Estimated: >60 mL/min (ref >60)
Glucose, Bld: 158 mg/dL — ABNORMAL HIGH (ref 70–99)
Potassium: 4 mmol/L (ref 3.5–5.1)
Sodium: 133 mmol/L — ABNORMAL LOW (ref 135–145)
Total Bilirubin: 0.9 mg/dL (ref 0.3–1.2)
Total Protein: 7.3 g/dL (ref 6.5–8.1)

## 2022-08-15 LAB — BASIC METABOLIC PANEL
Anion gap: 11 (ref 5–15)
BUN: 23 mg/dL (ref 8–23)
CO2: 26 mmol/L (ref 22–32)
Calcium: 9 mg/dL (ref 8.9–10.3)
Chloride: 98 mmol/L (ref 98–111)
Creatinine, Ser: 0.98 mg/dL (ref 0.44–1.00)
GFR, Estimated: 55 mL/min — ABNORMAL LOW (ref >60)
Glucose, Bld: 128 mg/dL — ABNORMAL HIGH (ref 70–99)
Potassium: 3.5 mmol/L (ref 3.5–5.1)
Sodium: 135 mmol/L (ref 135–145)

## 2022-08-15 LAB — CBC
HCT: 38.3 % (ref 36.0–46.0)
Hemoglobin: 12.8 g/dL (ref 12.0–15.0)
MCH: 30.9 pg (ref 26.0–34.0)
MCHC: 33.4 g/dL (ref 30.0–36.0)
MCV: 92.5 fL (ref 80.0–100.0)
Platelets: 204 10*3/uL (ref 150–400)
RBC: 4.14 MIL/uL (ref 3.87–5.11)
RDW: 13.3 % (ref 11.5–15.5)
WBC: 20.4 10*3/uL — ABNORMAL HIGH (ref 4.0–10.5)
nRBC: 0 % (ref 0.0–0.2)

## 2022-08-15 LAB — PHOSPHORUS: Phosphorus: 3.1 mg/dL (ref 2.5–4.6)

## 2022-08-15 LAB — LIPASE, BLOOD: Lipase: 24 U/L (ref 11–51)

## 2022-08-15 LAB — PROTIME-INR
INR: 1.3 — ABNORMAL HIGH (ref 0.8–1.2)
Prothrombin Time: 16.6 seconds — ABNORMAL HIGH (ref 11.4–15.2)

## 2022-08-15 LAB — MAGNESIUM: Magnesium: 1.7 mg/dL (ref 1.7–2.4)

## 2022-08-15 LAB — TROPONIN I (HIGH SENSITIVITY): Troponin I (High Sensitivity): 76 ng/L — ABNORMAL HIGH (ref <18)

## 2022-08-15 MED ORDER — ONDANSETRON HCL 4 MG/2ML IJ SOLN: 4.0000 mg | Freq: Four times a day (QID) | INTRAMUSCULAR | Status: DC | PRN

## 2022-08-15 MED ORDER — HYDROCODONE-ACETAMINOPHEN 5-325 MG PO TABS
1.0000 | ORAL_TABLET | ORAL | Status: DC | PRN
  Administered 2022-08-15 – 2022-08-16 (×2): 2 via ORAL
  Administered 2022-08-17: 1 via ORAL
  Filled 2022-08-15: qty 1
  Filled 2022-08-15: qty 2
  Filled 2022-08-15: qty 1
  Filled 2022-08-15: qty 2

## 2022-08-15 MED ORDER — ONDANSETRON HCL 4 MG/2ML IJ SOLN
INTRAMUSCULAR | Status: AC
  Filled 2022-08-15: qty 2

## 2022-08-15 MED ORDER — ACETAMINOPHEN 650 MG RE SUPP: 650.0000 mg | Freq: Four times a day (QID) | RECTAL | Status: DC | PRN

## 2022-08-15 MED ORDER — ENOXAPARIN SODIUM 40 MG/0.4ML IJ SOSY: 40.0000 mg | PREFILLED_SYRINGE | INTRAMUSCULAR | Status: DC

## 2022-08-15 MED ORDER — ACETAMINOPHEN 325 MG PO TABS
650.0000 mg | ORAL_TABLET | Freq: Four times a day (QID) | ORAL | Status: DC | PRN
  Administered 2022-08-19: 650 mg via ORAL
  Filled 2022-08-15: qty 2

## 2022-08-15 MED ORDER — HYDRALAZINE HCL 20 MG/ML IJ SOLN: 5.0000 mg | INTRAMUSCULAR | Status: DC | PRN

## 2022-08-15 MED ORDER — LACTATED RINGERS IV SOLN: INTRAVENOUS | Status: DC

## 2022-08-15 MED ORDER — MORPHINE SULFATE (PF) 2 MG/ML IV SOLN: 2.0000 mg | INTRAVENOUS | Status: DC | PRN

## 2022-08-15 MED ORDER — SODIUM CHLORIDE 0.9% FLUSH
5.0000 mL | Freq: Three times a day (TID) | INTRAVENOUS | Status: DC
  Administered 2022-08-15 – 2022-08-20 (×16): 5 mL

## 2022-08-15 MED ORDER — FENTANYL CITRATE (PF) 100 MCG/2ML IJ SOLN
INTRAMUSCULAR | Status: AC
  Filled 2022-08-15: qty 2

## 2022-08-15 MED ORDER — ONDANSETRON HCL 4 MG/2ML IJ SOLN
INTRAMUSCULAR | Status: AC | PRN
  Administered 2022-08-15: 2 mg via INTRAVENOUS

## 2022-08-15 MED ORDER — IOPAMIDOL (ISOVUE-300) INJECTION 61%: 5.0000 mL | Freq: Once | INTRAVENOUS | Status: DC | PRN

## 2022-08-15 MED ORDER — MIDAZOLAM HCL 2 MG/2ML IJ SOLN
INTRAMUSCULAR | Status: AC
  Filled 2022-08-15: qty 2

## 2022-08-15 MED ORDER — DONEPEZIL HCL 5 MG PO TABS
5.0000 mg | ORAL_TABLET | Freq: Every day | ORAL | Status: DC
  Administered 2022-08-15 – 2022-08-20 (×6): 5 mg via ORAL
  Filled 2022-08-15 (×6): qty 1

## 2022-08-15 MED ORDER — MIDAZOLAM HCL 2 MG/2ML IJ SOLN
INTRAMUSCULAR | Status: AC | PRN
  Administered 2022-08-15: .5 mg via INTRAVENOUS

## 2022-08-15 MED ORDER — LIDOCAINE HCL 1 % IJ SOLN
10.0000 mL | Freq: Once | INTRAMUSCULAR | Status: DC
  Filled 2022-08-15: qty 10

## 2022-08-15 MED ORDER — ENOXAPARIN SODIUM 30 MG/0.3ML IJ SOSY
30.0000 mg | PREFILLED_SYRINGE | INTRAMUSCULAR | Status: DC
  Administered 2022-08-16 – 2022-08-18 (×3): 30 mg via SUBCUTANEOUS
  Filled 2022-08-15 (×3): qty 0.3

## 2022-08-15 MED ORDER — LIDOCAINE HCL 1 % IJ SOLN
INTRAMUSCULAR | Status: AC
  Filled 2022-08-15: qty 20

## 2022-08-15 MED ORDER — PIPERACILLIN-TAZOBACTAM 3.375 G IVPB 30 MIN
3.3750 g | Freq: Once | INTRAVENOUS | Status: AC
  Administered 2022-08-15: 3.375 g via INTRAVENOUS
  Filled 2022-08-15: qty 50

## 2022-08-15 MED ORDER — PIPERACILLIN-TAZOBACTAM 3.375 G IVPB
3.3750 g | Freq: Three times a day (TID) | INTRAVENOUS | Status: DC
  Administered 2022-08-15 – 2022-08-17 (×7): 3.375 g via INTRAVENOUS
  Filled 2022-08-15 (×7): qty 50

## 2022-08-15 MED ORDER — ONDANSETRON HCL 4 MG PO TABS
4.0000 mg | ORAL_TABLET | Freq: Four times a day (QID) | ORAL | Status: DC | PRN
  Administered 2022-08-16: 4 mg via ORAL
  Filled 2022-08-15: qty 1

## 2022-08-15 MED ORDER — ONDANSETRON HCL 4 MG/2ML IJ SOLN
4.0000 mg | Freq: Once | INTRAMUSCULAR | Status: AC
  Administered 2022-08-15: 4 mg via INTRAVENOUS
  Filled 2022-08-15: qty 2

## 2022-08-15 MED ORDER — LACTATED RINGERS IV BOLUS
1000.0000 mL | Freq: Once | INTRAVENOUS | Status: AC
  Administered 2022-08-15: 1000 mL via INTRAVENOUS

## 2022-08-15 MED ORDER — FENTANYL CITRATE (PF) 100 MCG/2ML IJ SOLN
INTRAMUSCULAR | Status: AC | PRN
  Administered 2022-08-15: 25 ug via INTRAVENOUS

## 2022-08-15 NOTE — Progress Notes (Signed)
Pharmacy Antibiotic Note  Tammy Dillon is a 89 y.o. female admitted on 08/14/2022 with intra-abdominal infection.  Pharmacy has been consulted for Zosyn dosing.  Plan: Zosyn 3.375g IV q8h (4 hour infusion).  Pharmacy will continue to follow and will adjust abx dosing whenever warranted.  Temp (24hrs), Avg:98.9 F (37.2 C), Min:98.1 F (36.7 C), Max:99.6 F (37.6 C)   Recent Labs  Lab 08/14/22 2118 08/14/22 2325  WBC 26.1*  --   CREATININE  --  0.84    Estimated Creatinine Clearance: 30.4 mL/min (by C-G formula based on SCr of 0.84 mg/dL).    Allergies  Allergen Reactions   Aldara [Imiquimod] Other (See Comments)    SEVERE DISCOLORATION OF THE SKIN- STOPPED BY A MD   Other Nausea And Vomiting and Other (See Comments)    Patient requires anti-nausea medication with most ANY medication, INCLUDING anesthesia- has had this issue her whole life   Codeine Nausea And Vomiting    Antimicrobials this admission: 5/29 Zosyn >>   Microbiology results: No lab cx currently ordered or pending at this time.  Thank you for allowing pharmacy to be a part of this patient's care.  Otelia Sergeant, PharmD, [facility] 08/15/2022 4:01 AM

## 2022-08-15 NOTE — ED Notes (Addendum)
Pt taken to bathroom via WC by this RN. Clothing soiled, pt assisted with cleaning and brief placed. Pt taken back to bed and new chucks pad placed. Pt positioned in bed for comfort, denies needs at this time. WCTM. Call light in reach, family at bedside.

## 2022-08-15 NOTE — H&P (Signed)
History and Physical    Patient: Tammy Dillon:[id number] DOB: [birth year] DOA: 08/14/2022 DOS: the patient was seen and examined on 08/15/2022 PCP: Emilio Aspen, MD  Patient coming from: Home  Chief Complaint:  Chief Complaint  Patient presents with   Nausea   Weakness   Emesis    HPI: Tammy Dillon is a 89 y.o. female with medical history significant for HTN, s/p pacemaker 2021 who presents to theED with upper abdominal pain, nausea and vomiting.  She denies fever or chills, diarrhea or dysuria.  Has no chest pain or shortness of breath. ED course and data review: Vitals within normal limits.  Labs notable for WBC of 26,000, lipase and LFTs WNL.  Urinalysis with trace leukocytes and many bacteria.  Troponin 76. EKG, personally viewed and interpreted showing NSR with RBBB RUQ ultrasound showing possible acute cholecystitis The ED provider: Spoke with surgeon Dr. Maia Plan who recommended admission to the medical service for antibiotics. Hospitalist consulted in the a.m.     Past Medical History:  Diagnosis Date   Complication of anesthesia    Nausea/Vomiting   Hypertension    Past Surgical History:  Procedure Laterality Date   ERCP N/A 02/27/2019   Procedure: ENDOSCOPIC RETROGRADE CHOLANGIOPANCREATOGRAPHY (ERCP);  Surgeon: Vida Rigger, MD;  Location: [facility];  Service: Endoscopy;  Laterality: N/A;   PACEMAKER IMPLANT N/A 11/09/2019   Procedure: PACEMAKER IMPLANT;  Surgeon: Regan Lemming, MD;  Location: [facility];  Service: Cardiovascular;  Laterality: N/A;   REMOVAL OF STONES  02/27/2019   Procedure: REMOVAL OF STONES;  Surgeon: Vida Rigger, MD;  Location: [facility];  Service: Endoscopy;;   SPHINCTEROTOMY  02/27/2019   Procedure: Dennison Mascot;  Surgeon: Vida Rigger, MD;  Location: [facility];  Service: Endoscopy;;   Social History:  has no history on file for tobacco use, alcohol use, and drug use.  Allergies  Allergen Reactions    Aldara [Imiquimod] Other (See Comments)    SEVERE DISCOLORATION OF THE SKIN- STOPPED BY A MD   Other Nausea And Vomiting and Other (See Comments)    Patient requires anti-nausea medication with most ANY medication, INCLUDING anesthesia- has had this issue her whole life   Codeine Nausea And Vomiting    Family History  Problem Relation Age of Onset   Cancer Mother        breast   Cancer - Lung Father    Diabetes Son     Prior to Admission medications   Medication Sig Start Date End Date Taking? Authorizing Provider  aspirin 81 MG EC tablet Take 81 mg by mouth every Monday, Tuesday, Wednesday, Thursday, and Friday. Take    [provider]  Cholecalciferol (VITAMIN D3) 50 MCG (2000 UT) TABS Take 2,000 Units by mouth every evening.    [provider]  desloratadine (CLARINEX) 5 MG tablet Take 5 mg by mouth every evening.  02/20/19   [provider]  hydroxypropyl methylcellulose / hypromellose (ISOPTO TEARS / GONIOVISC) 2.5 % ophthalmic solution Place 1 drop into both eyes daily as needed for dry eyes.    [provider]  levocetirizine (XYZAL) 5 MG tablet Take 5 mg by mouth every evening.    [provider]  ondansetron (ZOFRAN) 4 MG tablet Take 4 mg by mouth as needed. 12/31/19   [provider]  OVER THE COUNTER MEDICATION Take 7.5-10 mLs by mouth See admin instructions. Superior Elemental Collodial Silver - Chemical Free- Immune Support- Take 7.5-10 ml's by mouth  once a day    [provider]  raloxifene (EVISTA) 60 MG tablet Take 60 mg by mouth every evening. 02/20/19   [provider]  rosuvastatin (CRESTOR) 5 MG tablet Take 5 mg by mouth every Monday, Wednesday, and Friday. In the evening 02/20/19   [provider]  telmisartan-hydrochlorothiazide (MICARDIS HCT) 80-25 MG tablet Take 0.5 tablets by mouth 2 (two) times a week.  02/20/19   [provider]    Physical Exam: Vitals:   08/14/22 2320  08/15/22 0000 08/15/22 0100 08/15/22 0130  BP: 134/63 135/61 (!) 130/56 133/60  Pulse: 91 91 91 100  Resp: 18 18 14  (!) 21  Temp: 99.6 F (37.6 C)     TempSrc: Oral     SpO2: 99% 94% 98% 96%  Weight:      Height:       Physical Exam Vitals and nursing note reviewed.  Constitutional:      General: She is not in acute distress. HENT:     Head: Normocephalic and atraumatic.  Cardiovascular:     Rate and Rhythm: Normal rate and regular rhythm.     Heart sounds: Normal heart sounds.  Pulmonary:     Effort: Pulmonary effort is normal.     Breath sounds: Normal breath sounds.  Abdominal:     Palpations: Abdomen is soft.     Tenderness: There is abdominal tenderness in the right upper quadrant.  Neurological:     Mental Status: Mental status is at baseline.     Labs on Admission: I have personally reviewed following labs and imaging studies  CBC: Recent Labs  Lab 08/14/22 2118  WBC 26.1*  HGB 11.7*  HCT 34.8*  MCV 92.3  PLT 218   Basic Metabolic Panel: Recent Labs  Lab 08/14/22 2325  NA 133*  K 4.0  CL 99  CO2 25  GLUCOSE 158*  BUN 22  CREATININE 0.84  CALCIUM 9.3   GFR: Estimated Creatinine Clearance: 30.4 mL/min (by C-G formula based on SCr of 0.84 mg/dL). Liver Function Tests: Recent Labs  Lab 08/14/22 2325  AST 30  ALT 14  ALKPHOS 47  BILITOT 0.9  PROT 7.3  ALBUMIN 3.9   Recent Labs  Lab 08/14/22 2325  LIPASE 24   No results for input(s): "AMMONIA" in the last 168 hours. Coagulation Profile: No results for input(s): "INR", "PROTIME" in the last 168 hours. Cardiac Enzymes: No results for input(s): "CKTOTAL", "CKMB", "CKMBINDEX", "TROPONINI" in the last 168 hours. BNP (last 3 results) No results for input(s): "PROBNP" in the last 8760 hours. HbA1C: No results for input(s): "HGBA1C" in the last 72 hours. CBG: No results for input(s): "GLUCAP" in the last 168 hours. Lipid Profile: No results for input(s): "CHOL", "HDL", "LDLCALC", "TRIG",  "CHOLHDL", "LDLDIRECT" in the last 72 hours. Thyroid Function Tests: No results for input(s): "TSH", "T4TOTAL", "FREET4", "T3FREE", "THYROIDAB" in the last 72 hours. Anemia Panel: No results for input(s): "VITAMINB12", "FOLATE", "FERRITIN", "TIBC", "IRON", "RETICCTPCT" in the last 72 hours. Urine analysis:    Component Value Date/Time   COLORURINE AMBER (A) 08/14/2022 2358   APPEARANCEUR CLOUDY (A) 08/14/2022 2358   LABSPEC 1.019 08/14/2022 2358   PHURINE 5.0 08/14/2022 2358   GLUCOSEU NEGATIVE 08/14/2022 2358   HGBUR NEGATIVE 08/14/2022 2358   BILIRUBINUR NEGATIVE 08/14/2022 2358   KETONESUR 5 (A) 08/14/2022 2358   PROTEINUR 30 (A) 08/14/2022 2358   NITRITE NEGATIVE 08/14/2022 2358   LEUKOCYTESUR TRACE (A) 08/14/2022 2358    Radiological Exams  on Admission: US ABDOMEN LIMITED RUQ (LIVER/GB)  Result Date: 08/15/2022 CLINICAL DATA:  Right upper quadrant pain, vomiting EXAM: ULTRASOUND ABDOMEN LIMITED RIGHT UPPER QUADRANT COMPARISON:  CT 02/11/2019 FINDINGS: Gallbladder: Gallstone measuring up to 1.9 cm. Gallbladder wall thickening measuring 9 mm. Trace pericholecystic fluid. Sludge within the gallbladder. Common bile duct: Diameter: Normal caliber, 4 mm. Liver: No focal lesion identified. Within normal limits in parenchymal echogenicity. Portal vein is patent on color Doppler imaging with normal direction of blood flow towards the liver. Other: None. IMPRESSION: Cholelithiasis, sludge within the gallbladder. Gallbladder wall thickening and small amount of pericholecystic fluid. Findings concerning for acute cholecystitis. Electronically Signed   By: Charlett Nose M.D.   On: 08/15/2022 01:20     Data Reviewed: Relevant notes from primary care and specialist visits, past discharge summaries as available in EHR, including Care Everywhere. Prior diagnostic testing as pertinent to current admission diagnoses Updated medications and problem lists for reconciliation ED course, including vitals,  labs, imaging, treatment and response to treatment Triage notes, nursing and pharmacy notes and ED provider's notes Notable results as noted in HPI   Assessment and Plan: * Acute cholecystitis Continue Zosyn started in the emergency room Pain control, IV antiemetics IV fluids Keep n.p.o. Surgical consult  Elevated troponin Troponin 76 suspect demand ischemia from acute illness Patient denies chest pain and EKG nonacute Continue to trend Consult cardiology if uptrending  Essential hypertension Hydralazine as needed while n.p.o.  History of cardiac pacemaker No acute issues suspected     DVT prophylaxis: Lovenox  Consults: Surgery, Dr. Maia Plan  Advance Care Planning:   Code Status: Prior   Family Communication: son at bedside  Disposition Plan: Back to previous home environment  Severity of Illness: The appropriate patient status for this patient is INPATIENT. Inpatient status is judged to be reasonable and necessary in order to provide the required intensity of service to ensure the patient's safety. The patient's presenting symptoms, physical exam findings, and initial radiographic and laboratory data in the context of their chronic comorbidities is felt to place them at high risk for further clinical deterioration. Furthermore, it is not anticipated that the patient will be medically stable for discharge from the hospital within 2 midnights of admission.   * I certify that at the point of admission it is my clinical judgment that the patient will require inpatient hospital care spanning beyond 2 midnights from the point of admission due to high intensity of service, high risk for further deterioration and high frequency of surveillance required.*  Author: Andris Baumann, MD 08/15/2022 3:47 AM  For on call review [email or web address].

## 2022-08-15 NOTE — Evaluation (Addendum)
Physical Therapy Evaluation Patient Details Name: Tammy Dillon MRN: [id number] DOB: 21-Nov-1929 Today's Date: 08/15/2022  History of Present Illness  Pt is a 89 yo female that presented to ED for nausea, vomiting, abdominal pain. Percutaneous cholecystomy placed. PMH of dementia, pacemaker, HTN.   Clinical Impression  Patient oriented to name and year, able to recall month/location after being re-oriented. Noted for delayed processing and difficulty with initiation of tasks. Family via phone confirmed pt is ambulatory with "walking stick" (supervision for stairs, gait), modI for ADLs and family is normally PRN for assistance.  She was able to transfer to EOB with min-modAx2 (2+ for safety). Progressed to sitting with fair balance with extended time and at least unilateral UE propping. Sit <> stand three times, CGA with RW and cues for hand placement initially. Noted for incontinence, stand pivot to [facility] modA. maxA for pericare. She was able to take several steps to the recliner with RW and minA, needed step by step cues. Up in chair with needs in reach at end of session.  Overall the patient demonstrated deficits (see "PT Problem List") that impede the patient's functional abilities, safety, and mobility and would benefit from skilled PT intervention.          Recommendations for follow up therapy are one component of a multi-disciplinary discharge planning process, led by the attending physician.  Recommendations may be updated based on patient status, additional functional criteria and insurance authorization.  Follow Up Recommendations Can patient physically be transported by private vehicle: Yes     Assistance Recommended at Discharge Frequent or constant Supervision/Assistance  Patient can return home with the following  A lot of help with walking and/or transfers;A lot of help with bathing/dressing/bathroom;Assistance with cooking/housework;Direct supervision/assist for medications  management;Assist for transportation;Assistance with feeding;Help with stairs or ramp for entrance    Equipment Recommendations Other (comment) (TBD)  Recommendations for Other Services       Functional Status Assessment Patient has had a recent decline in their functional status and demonstrates the ability to make significant improvements in function in a reasonable and predictable amount of time.     Precautions / Restrictions Precautions Precautions: Fall;ICD/Pacemaker Precaution Comments: cholecystomy Restrictions Weight Bearing Restrictions: No      Mobility  Bed Mobility Overal bed mobility: Needs Assistance Bed Mobility: Supine to Sit     Supine to sit: Min assist, Mod assist, +2 for safety/equipment     General bed mobility comments: minA to shift to EOB, at least modA to maintain sitting, with time progressed to CGA    Transfers Overall transfer level: Needs assistance Equipment used: Rolling walker (2 wheels) Transfers: Sit to/from Stand, Bed to chair/wheelchair/BSC Sit to Stand: Min guard Stand pivot transfers: Mod assist              Ambulation/Gait Ambulation/Gait assistance: Min assist Gait Distance (Feet): 2 Feet Assistive device: Rolling walker (2 wheels)         General Gait Details: minA for RW management and step by step sequencing  Stairs            Wheelchair Mobility    Modified Rankin (Stroke Patients Only)       Balance Overall balance assessment: Needs assistance Sitting-balance support: Feet supported Sitting balance-Leahy Scale: Fair Sitting balance - Comments: progressed to fair with extended time, initially poor requiring modA Postural control: Posterior lean Standing balance support: Bilateral upper extremity supported, Reliant on assistive device for balance Standing balance-Leahy Scale: Poor  Pertinent Vitals/Pain Pain Assessment Pain Assessment: Faces Faces Pain  Scale: Hurts a little bit Pain Location: abdomen Pain Descriptors / Indicators: Grimacing, Guarding, Moaning Pain Intervention(s): Limited activity within patient's tolerance, Monitored during session, Repositioned    Home Living Family/patient expects to be discharged to:: Private residence Living Arrangements: Children (other son and daughter checks in) Available Help at Discharge: Family (son and daughter work, one retired son) Type of Home: House Home Access: Stairs to enter Entrance Stairs-Rails: Right;Left;Can reach both (handrail in hallway) Entrance Stairs-Number of Steps: 2-3   Home Layout: One level Home Equipment:  ("walking stick") Additional Comments: Pt reported living with her son "david" that "dale" also assists. unclear level of assistance needed    Prior Function Prior Level of Function : Patient poor historian/Family not available             Mobility Comments: supervision for ambulation per family ADLs Comments: showers once a week, does not accept help     Hand Dominance        Extremity/Trunk Assessment   Upper Extremity Assessment Upper Extremity Assessment: Generalized weakness    Lower Extremity Assessment Lower Extremity Assessment: Generalized weakness       Communication      Cognition Arousal/Alertness: Awake/alert Behavior During Therapy: WFL for tasks assessed/performed, Flat affect Overall Cognitive Status: History of cognitive impairments - at baseline                                 General Comments: pt oriented to name and year; able to recall "hospital" and the month was may at end of session. very delayed responses, able to follow one step commands with repetition        General Comments      Exercises     Assessment/Plan    PT Assessment Patient needs continued PT services  PT Problem List Decreased strength;Decreased mobility;Decreased range of motion;Decreased activity tolerance;Decreased  balance;Decreased knowledge of use of DME;Decreased safety awareness       PT Treatment Interventions DME instruction;Therapeutic activities;Gait training;Therapeutic exercise;Patient/family education;Stair training;Balance training;Wheelchair mobility training;Functional mobility training;Neuromuscular re-education    PT Goals (Current goals can be found in the Care Plan section)  Acute Rehab PT Goals PT Goal Formulation: Patient unable to participate in goal setting Time For Goal Achievement: 08/29/22 Potential to Achieve Goals: Fair    Frequency Min 3X/week     Co-evaluation               AM-PAC PT "6 Clicks" Mobility  Outcome Measure Help needed turning from your back to your side while in a flat bed without using bedrails?: A Lot Help needed moving from lying on your back to sitting on the side of a flat bed without using bedrails?: Total Help needed moving to and from a bed to a chair (including a wheelchair)?: A Lot Help needed standing up from a chair using your arms (e.g., wheelchair or bedside chair)?: A Little Help needed to walk in hospital room?: A Little Help needed climbing 3-5 steps with a railing? : A Lot 6 Click Score: 13    End of Session Equipment Utilized During Treatment: Gait belt Activity Tolerance: Patient limited by fatigue Patient left: with chair alarm set;in chair;with call bell/phone within reach Nurse Communication: Mobility status PT Visit Diagnosis: Other abnormalities of gait and mobility (R26.89);Muscle weakness (generalized) (M62.81)    Time: 1610-9604 PT Time Calculation (min) (ACUTE ONLY): 27  min   Charges:   PT Evaluation $PT Eval Low Complexity: 1 Low PT Treatments $Therapeutic Activity: 23-37 mins       Olga Coaster PT, DPT 3:25 PM,08/15/22

## 2022-08-15 NOTE — Progress Notes (Signed)
Pt post sedation drowsy and dozing unable to complete admission profile at this time.

## 2022-08-15 NOTE — Procedures (Signed)
  Procedure:  US/fluoro guided perc cholecystostomy catheter placement 20ml purulent bile removed, sent for GS, C&S Preprocedure diagnosis: The primary encounter diagnosis was Cholecystitis. Diagnoses of Cystitis and Dehydration were also pertinent to this visit. Postprocedure diagnosis: same EBL:    minimal Complications:   none immediate  See full dictation in [employer].  Thora Lance MD Main # [phone] Pager  [phone] Mobile [phone]

## 2022-08-15 NOTE — Evaluation (Signed)
Occupational Therapy Evaluation Patient Details Name: Tammy Dillon MRN: [id number] DOB: [birth year] Today's Date: 08/15/2022   History of Present Illness Pt is a 89 yo female that presented to ED for nausea, vomiting, abdominal pain. Percutaneous cholecystomy placed. PMH of dementia, pacemaker, HTN.   Clinical Impression   Pt seen for OT evaluation this date.  Pt sitting up in chair upon OT arrival.  Poor historian, but pleasant and cooperative.  Oriented to name and month, but not place.  Pt demonstrates decreased sitting and standing balance, decreased activity tolerance.  Functional mobility performed in room with min guard and RW, but pace is extremely slow.  Mod A sit to supine.  Pt resides with son, though he works.  Recommend SNF if family is not able to provide 24/7 supv.  If pt returns home with 24/7 supv, pt would benefit from [facility] OT.  Will continue to follow in the acute setting to maximize indep with ADLs.        Recommendations for follow up therapy are one component of a multi-disciplinary discharge planning process, led by the attending physician.  Recommendations may be updated based on patient status, additional functional criteria and insurance authorization.   Assistance Recommended at Discharge Frequent or constant Supervision/Assistance  Patient can return home with the following A little help with walking and/or transfers;Assistance with cooking/housework;Assist for transportation;A little help with bathing/dressing/bathroom    Functional Status Assessment  Patient has had a recent decline in their functional status and demonstrates the ability to make significant improvements in function in a reasonable and predictable amount of time.  Equipment Recommendations   (defer to next venue)    Recommendations for Other Services       Precautions / Restrictions Precautions Precautions: Fall;ICD/Pacemaker Precaution Comments: cholecystomy Restrictions Weight Bearing  Restrictions: No      Mobility Bed Mobility Overal bed mobility: Needs Assistance Bed Mobility: Sit to Supine       Sit to supine: Mod assist   General bed mobility comments: with bed flat, pt made attempt at scooting self toward [facility] with OT holding feet in place and pt grabbing bed rails, but pt was unable to reposition and required assist to scoot with chuck pad. Patient Response: Cooperative, Flat affect  Transfers Overall transfer level: Needs assistance Equipment used: Rolling walker (2 wheels) Transfers: Sit to/from Stand Sit to Stand: Min guard                  Balance Overall balance assessment: Needs assistance Sitting-balance support: Feet supported, Bilateral upper extremity supported Sitting balance-Leahy Scale: Poor Sitting balance - Comments: Pt fearful to remove hands from arm rests of chair to reach to feet in sitting   Standing balance support: Bilateral upper extremity supported, Reliant on assistive device for balance Standing balance-Leahy Scale: Poor                             ADL either performed or assessed with clinical judgement   ADL Overall ADL's : Needs assistance/impaired                 Upper Body Dressing : Minimal assistance   Lower Body Dressing: Moderate assistance Lower Body Dressing Details (indicate cue type and reason): Pt was able to cross legs while sitting on edge of recliner, but pt did not feel like she could let go and maintain balance to reach feet to remove a sock and kept arms on  arm rest of chair.             Functional mobility during ADLs: Min guard;Rolling walker (2 wheels) General ADL Comments: Pt ambulated around bed with extremely slow pace using RW and min guard, cues for directional changes     Vision   Additional Comments: Unable to accurately assess d/t cognitive deficits     Perception     Praxis      Pertinent Vitals/Pain Pain Assessment Pain Assessment: Faces Faces Pain  Scale: Hurts a little bit Pain Location: abdomen Pain Descriptors / Indicators: Grimacing, Guarding, Sore Pain Intervention(s): Limited activity within patient's tolerance, Monitored during session, Repositioned     Hand Dominance     Extremity/Trunk Assessment Upper Extremity Assessment Upper Extremity Assessment: Generalized weakness   Lower Extremity Assessment Lower Extremity Assessment: Generalized weakness       Communication Communication Communication: Other (comment) (follows 1 step commands; increased time for processing)   Cognition Arousal/Alertness: Awake/alert Behavior During Therapy: WFL for tasks assessed/performed, Flat affect                                   General Comments: pt oriented to name and month; not oriented to place.  Pt thought she was in her bedroom at home.     General Comments       Exercises Other Exercises Other Exercises: Educ on OT role, goals, poc   Shoulder Instructions      Home Living Family/patient expects to be discharged to:: Private residence Living Arrangements: Children Available Help at Discharge: Family Type of Home: House Home Access: Stairs to enter Secretary/administrator of Steps: 2-3 Entrance Stairs-Rails: Right;Left;Can reach both Home Layout: One level     Bathroom Shower/Tub: Estate manager/land agent Accessibility: No   Home Equipment:  ("walking stick")   Additional Comments: Lives with son, Tammy Hua, who works      Prior Functioning/Environment Prior Level of Function : Patient poor historian/Family not available             Mobility Comments: supervision for ambulation per family ADLs Comments: showers once a week, does not accept help        OT Problem List: Decreased strength;Decreased activity tolerance;Impaired balance (sitting and/or standing);Decreased cognition;Pain      OT Treatment/Interventions: Self-care/ADL training;Therapeutic activities;Balance  training;Therapeutic exercise;Patient/family education    OT Goals(Current goals can be found in the care plan section) Acute Rehab OT Goals Patient Stated Goal: N/A; cognitive limitations OT Goal Formulation: With patient Time For Goal Achievement: 08/29/22 Potential to Achieve Goals: Good ADL Goals Pt Will Perform Lower Body Dressing: with min assist;sit to/from stand Pt Will Transfer to Toilet: with supervision;ambulating;grab bars Pt Will Perform Toileting - Clothing Manipulation and hygiene: with supervision;sit to/from stand  OT Frequency: Min 2X/week    Co-evaluation              AM-PAC OT "6 Clicks" Daily Activity     Outcome Measure Help from another person eating meals?: A Little Help from another person taking care of personal grooming?: A Little Help from another person toileting, which includes using toliet, bedpan, or urinal?: A Little Help from another person bathing (including washing, rinsing, drying)?: A Little Help from another person to put on and taking off regular upper body clothing?: A Little Help from another person to put on and taking off regular lower body clothing?: A Lot 6 Click  Score: 17   End of Session Equipment Utilized During Treatment: Gait belt;Rolling walker (2 wheels) Nurse Communication: Other (comment) (bed alarm mal functioning. OT notified charge nurse for NT to check in to set bed alarm.)  Activity Tolerance: Patient tolerated treatment well Patient left: in bed;with call bell/phone within reach  OT Visit Diagnosis: Unsteadiness on feet (R26.81);Muscle weakness (generalized) (M62.81)                Time: 0981-1914 OT Time Calculation (min): 15 min Charges:  OT General Charges $OT Visit: 1 Visit OT Evaluation $OT Eval Moderate Complexity: 1 Mod  Danelle Earthly, MS, OTR/L   Otis Dials 08/15/2022, 6:09 PM

## 2022-08-15 NOTE — Progress Notes (Signed)
Patient ID: Tammy Dillon, female   DOB: 1929/11/09, 89 y.o.   MRN: [id number]   SURGICAL CONSULTATION NOTE   HISTORY OF PRESENT ILLNESS (HPI):  89 y.o. female presented to [facility] ED for evaluation of abdominal pain, nausea and vomiting. Patient reports patient adequate historian but son at bedside.  DVT bolus they endorses that patient has been feeling weaker and dehydrated due to nausea and vomiting.  As per son she gets confused due to the dehydration.  Patient endorses having pain in the upper abdomen.  She cannot recall exactly when the pain started but all notes yesterday endorses that he has been at least 3 days with the symptoms.  At the ED she was found with tenderness to palpation in the right upper quadrant.  Her vital signs shows max temp of 99.9.  Heart rate and blood pressure within normal limits.  She had an ultrasound of the abdomen that shows cholelithiasis with gallbladder wall thickening and pericholecystic fluid.  LFTs and bilirubin within normal limits.  Surgery is consulted by Dr. Lucianne Muss in this context for evaluation and management of acute cholecystitis.  PAST MEDICAL HISTORY (PMH):  Past Medical History:  Diagnosis Date   Complication of anesthesia    Nausea/Vomiting   Hypertension      PAST SURGICAL HISTORY (PSH):  Past Surgical History:  Procedure Laterality Date   ERCP N/A 02/27/2019   Procedure: ENDOSCOPIC RETROGRADE CHOLANGIOPANCREATOGRAPHY (ERCP);  Surgeon: Vida Rigger, MD;  Location: [facility];  Service: Endoscopy;  Laterality: N/A;   PACEMAKER IMPLANT N/A 11/09/2019   Procedure: PACEMAKER IMPLANT;  Surgeon: Regan Lemming, MD;  Location: [facility] INVASIVE CV LAB;  Service: Cardiovascular;  Laterality: N/A;   REMOVAL OF STONES  02/27/2019   Procedure: REMOVAL OF STONES;  Surgeon: Vida Rigger, MD;  Location: [facility] ENDOSCOPY;  Service: Endoscopy;;   SPHINCTEROTOMY  02/27/2019   Procedure: Dennison Mascot;  Surgeon: Vida Rigger, MD;  Location: [facility] ENDOSCOPY;   Service: Endoscopy;;     MEDICATIONS:  Prior to Admission medications   Medication Sig Start Date End Date Taking? Authorizing Provider  aspirin 81 MG EC tablet Take 81 mg by mouth every Monday, Tuesday, Wednesday, Thursday, and Friday. Take   Yes [provider]  desloratadine (CLARINEX) 5 MG tablet Take 5 mg by mouth every evening.  02/20/19  Yes [provider]  hydroxypropyl methylcellulose / hypromellose (ISOPTO TEARS / GONIOVISC) 2.5 % ophthalmic solution Place 1 drop into both eyes daily as needed for dry eyes.   Yes [provider]  levocetirizine (XYZAL) 5 MG tablet Take 5 mg by mouth every evening.   Yes [provider]  ondansetron (ZOFRAN) 4 MG tablet Take 4 mg by mouth as needed. 12/31/19  Yes [provider]  OVER THE COUNTER MEDICATION Take 7.5-10 mLs by mouth See admin instructions. Superior Elemental Collodial Silver - Chemical Free- Immune Support- Take 7.5-10 ml's by mouth once a day   Yes [provider]  raloxifene (EVISTA) 60 MG tablet Take 60 mg by mouth every evening. 02/20/19  Yes [provider]  rosuvastatin (CRESTOR) 5 MG tablet Take 5 mg by mouth every Monday, Wednesday, and Friday. In the evening 02/20/19  Yes [provider]  telmisartan-hydrochlorothiazide (MICARDIS HCT) 80-25 MG tablet Take 0.5 tablets by mouth 2 (two) times a week.  02/20/19  Yes [provider]  Cholecalciferol (VITAMIN D3) 50 MCG (2000 UT) TABS Take 2,000 Units by mouth every evening.    [provider]  ALLERGIES:  Allergies  Allergen Reactions   Aldara [Imiquimod] Other (See Comments)    SEVERE DISCOLORATION OF THE SKIN- STOPPED BY A MD   Other Nausea And Vomiting and Other (See Comments)    Patient requires anti-nausea medication with most ANY medication, INCLUDING anesthesia- has had this issue her whole life   Codeine Nausea And Vomiting     SOCIAL HISTORY:  Social History   Socioeconomic  History   Marital status: Widowed    Spouse name: Not on file   Number of children: Not on file   Years of education: Not on file   Highest education level: Not on file  Occupational History   Not on file  Tobacco Use   Smoking status: Not on file   Smokeless tobacco: Not on file  Substance and Sexual Activity   Alcohol use: Not on file   Drug use: Not on file   Sexual activity: Not on file  Other Topics Concern   Not on file  Social History Narrative   Not on file   Social Determinants of Health   Financial Resource Strain: Not on file  Food Insecurity: No Food Insecurity (08/15/2022)   Hunger Vital Sign    Worried About Running Out of Food in the Last Year: Never true    Ran Out of Food in the Last Year: Never true  Transportation Needs: No Transportation Needs (08/15/2022)   PRAPARE - Administrator, Civil Service (Medical): No    Lack of Transportation (Non-Medical): No  Physical Activity: Not on file  Stress: Not on file  Social Connections: Not on file  Intimate Partner Violence: Not At Risk (08/15/2022)   Humiliation, Afraid, Rape, and Kick questionnaire    Fear of Current or Ex-Partner: No    Emotionally Abused: No    Physically Abused: No    Sexually Abused: No      FAMILY HISTORY:  Family History  Problem Relation Age of Onset   Cancer Mother        breast   Cancer - Lung Father    Diabetes Son      REVIEW OF SYSTEMS:  Constitutional: denies weight loss, fever, chills, or sweats.  Positive for malaise Eyes: denies any other vision changes, history of eye injury  ENT: denies sore throat, hearing problems  Respiratory: denies shortness of breath, wheezing  Cardiovascular: denies chest pain, palpitations  Gastrointestinal: Positive abdominal pain, nausea and vomiting Genitourinary: denies burning with urination or urinary frequency Musculoskeletal: denies any other joint pains or cramps  Skin: denies any other rashes or skin discolorations   Neurological: denies any other headache, dizziness, positive for weakness  Psychiatric: denies any other depression, anxiety   All other review of systems were negative   VITAL SIGNS:  Temp:  [98.1 F (36.7 C)-99.9 F (37.7 C)] 99.9 F (37.7 C) (05/29 0653) Pulse Rate:  [89-100] 90 (05/29 0700) Resp:  [14-26] 15 (05/29 0700) BP: (120-139)/(50-67) 120/50 (05/29 0700) SpO2:  [94 %-99 %] 97 % (05/29 0700) Weight:  [50 kg] 50 kg (05/28 2116)     Height: 4\' 11"  (149.9 cm) Weight: 50 kg BMI (Calculated): 22.25   INTAKE/OUTPUT:  This shift: No intake/output data recorded.  Last 2 shifts: @IOLAST2SHIFTS @   PHYSICAL EXAM:  Constitutional:  -- Normal body habitus  -- Awake, alert, and oriented x3.  Chronic cognitive deficiency Eyes:  -- Pupils equally round and reactive to light  -- No scleral icterus  Ear, nose, and throat:  --  No jugular venous distension  Pulmonary:  -- No crackles  -- Equal breath sounds bilaterally -- Breathing non-labored at rest Cardiovascular:  -- S1, S2 present  -- No pericardial rubs Gastrointestinal:  -- Abdomen soft, tender to palpation in the right upper quadrant, non-distended, no guarding or rebound tenderness -- No abdominal masses appreciated, pulsatile or otherwise  Musculoskeletal and Integumentary:  -- Wounds: None appreciated -- Extremities: B/L UE and LE FROM, hands and feet warm, no edema  Neurologic:  -- Motor function: intact and symmetric -- Sensation: intact and symmetric   Labs:     Latest Ref Rng & Units 08/14/2022    9:18 PM 11/09/2019    6:17 AM 11/08/2019   11:30 AM  CBC  WBC 4.0 - 10.5 K/uL 26.1  7.1  9.8   Hemoglobin 12.0 - 15.0 g/dL 91.4  9.6  9.5   Hematocrit 36.0 - 46.0 % 34.8  29.6  29.3   Platelets 150 - 400 K/uL 218  198  211       Latest Ref Rng & Units 08/14/2022   11:25 PM 11/09/2019    6:17 AM 11/08/2019   11:30 AM  CMP  Glucose 70 - 99 mg/dL 782  90    BUN 8 - 23 mg/dL 22  29    Creatinine 9.56 - 1.00  mg/dL 2.13  0.86  5.78   Sodium 135 - 145 mmol/L 133  139    Potassium 3.5 - 5.1 mmol/L 4.0  4.0    Chloride 98 - 111 mmol/L 99  109    CO2 22 - 32 mmol/L 25  23    Calcium 8.9 - 10.3 mg/dL 9.3  9.1    Total Protein 6.5 - 8.1 g/dL 7.3     Total Bilirubin 0.3 - 1.2 mg/dL 0.9     Alkaline Phos 38 - 126 U/L 47     AST 15 - 41 U/L 30     ALT 0 - 44 U/L 14        Imaging studies:  EXAM: ULTRASOUND ABDOMEN LIMITED RIGHT UPPER QUADRANT   COMPARISON:  CT 02/11/2019   FINDINGS: Gallbladder:   Gallstone measuring up to 1.9 cm. Gallbladder wall thickening measuring 9 mm. Trace pericholecystic fluid. Sludge within the gallbladder.   Common bile duct:   Diameter: Normal caliber, 4 mm.   Liver:   No focal lesion identified. Within normal limits in parenchymal echogenicity. Portal vein is patent on color Doppler imaging with normal direction of blood flow towards the liver.   Other: None.   IMPRESSION: Cholelithiasis, sludge within the gallbladder. Gallbladder wall thickening and small amount of pericholecystic fluid. Findings concerning for acute cholecystitis.     Electronically Signed   By: Charlett Nose M.D.   On: 08/15/2022 01:20  Assessment/Plan:  89 y.o. female with acute cholecystitis, complicated by pertinent comorbidities including AV block Mobitz 2 with pacemaker, advanced age with early dementia, hypertension.  Patient with severe cholecystitis with significant elevation of white blood cell count with more than 3 days of onset of pain and symptoms.  There is significant tenderness of palpation in the right upper quadrant.  I considered the patient is high risk for urgent surgical intervention for acute cholecystitis.  I would recommend to proceed with percutaneous cholecystostomy as the treatment for cholecystitis in combination with the antibiotic therapy.  Once cholecystitis is resolved, we can optimize patient to see if she will be surgical candidate but at this  time surface of surgery  outweighed the benefits.  Patient has history of choledocholithiasis s/p ERCP on 2020.  No documentation of why cholecystectomy was not considered at that time.  Since then she has deteriorated with episode of syncope due to AV block.  She is now with pacemaker.  Will continue to follow-up.  Gae Gallop, MD

## 2022-08-15 NOTE — ED Notes (Signed)
US at bedside

## 2022-08-15 NOTE — ED Notes (Signed)
ED Provider at bedside. 

## 2022-08-15 NOTE — Assessment & Plan Note (Signed)
Hydralazine as needed while n.p.o. 

## 2022-08-15 NOTE — Consult Note (Signed)
MEDICATION-RELATED CONSULT NOTE   IR Procedure Consult - Anticoagulant/Antiplatelet PTA/Inpatient Med List Review by Pharmacist    Procedure: US/fluoro guided perc cholecystostomy catheter placement     Completed: 5/29   Post-Procedural bleeding risk per IR MD assessment:  High  Antithrombotic medications on inpatient or PTA profile prior to procedure: enoxaparin 40 mg daily.     Recommended restart time per IR Post-Procedure Guidelines:  Day + 1 (next AM).    Plan:     Enoxaparin 40 mg daily ordered for 5/30 AM.

## 2022-08-15 NOTE — Assessment & Plan Note (Signed)
No acute issues suspected 

## 2022-08-15 NOTE — ED Provider Notes (Signed)
[facility] Provider Note    Event Date/Time   First MD Initiated Contact with Patient 08/14/22 2351     (approximate)   History   Chief Complaint: Nausea, Weakness, and Emesis   HPI  Tammy Dillon is a 89 y.o. female with a past history of hypertension who comes ED complaining of fatigue, nausea vomiting for the past 3 days, gradually worsening.  Worse with eating.  Family reports concern for confusion as well.  Patient denies any acute urinary symptoms.     Physical Exam   Triage Vital Signs: ED Triage Vitals  Enc Vitals Group     BP 08/14/22 2111 139/67     Pulse Rate 08/14/22 2111 90     Resp 08/14/22 2111 18     Temp 08/14/22 2111 98.1 F (36.7 C)     Temp Source 08/14/22 2320 Oral     SpO2 08/14/22 2111 98 %     Weight 08/14/22 2116 110 lb 3.7 oz (50 kg)     Height 08/14/22 2116 4\' 11"  (1.499 m)     Head Circumference --      Peak Flow --      Pain Score 08/14/22 2116 0     Pain Loc --      Pain Edu? --      Excl. in GC? --     Most recent vital signs: Vitals:   08/15/22 0653 08/15/22 0700  BP: (!) 124/50 (!) 120/50  Pulse: 89 90  Resp: (!) 26 15  Temp: 99.9 F (37.7 C)   SpO2: 96% 97%    General: Awake, no distress.  CV:  Good peripheral perfusion.  Regular rate and rhythm Resp:  Normal effort.  Clear to auscultation bilaterally Abd:  No distention.  Soft with right upper quadrant tenderness Other:  Dry mucous membranes   ED Results / Procedures / Treatments   Labs (all labs ordered are listed, but only abnormal results are displayed) Labs Reviewed  CBC - Abnormal; Notable for the following components:      Result Value   WBC 26.1 (*)    RBC 3.77 (*)    Hemoglobin 11.7 (*)    HCT 34.8 (*)    All other components within normal limits  URINALYSIS, ROUTINE W REFLEX MICROSCOPIC - Abnormal; Notable for the following components:   Color, Urine AMBER (*)    APPearance CLOUDY (*)    Ketones, ur 5 (*)    Protein, ur  30 (*)    Leukocytes,Ua TRACE (*)    Bacteria, UA MANY (*)    All other components within normal limits  COMPREHENSIVE METABOLIC PANEL - Abnormal; Notable for the following components:   Sodium 133 (*)    Glucose, Bld 158 (*)    All other components within normal limits  TROPONIN I (HIGH SENSITIVITY) - Abnormal; Notable for the following components:   Troponin I (High Sensitivity) 73 (*)    All other components within normal limits  TROPONIN I (HIGH SENSITIVITY) - Abnormal; Notable for the following components:   Troponin I (High Sensitivity) 76 (*)    All other components within normal limits  LIPASE, BLOOD     EKG Interpreted by me Sinus rhythm rate of 88.  Right axis, right bundle branch block.  No acute ischemic changes   RADIOLOGY Ultrasound right upper quadrant interpreted by me, shows gallbladder wall thickening and some pericholecystic fluid, consistent with cholecystitis.  Radiology report reviewed   PROCEDURES:  Procedures   MEDICATIONS ORDERED IN ED: Medications  enoxaparin (LOVENOX) injection 40 mg (has no administration in time range)  acetaminophen (TYLENOL) tablet 650 mg (has no administration in time range)    Or  acetaminophen (TYLENOL) suppository 650 mg (has no administration in time range)  ondansetron (ZOFRAN) tablet 4 mg (has no administration in time range)    Or  ondansetron (ZOFRAN) injection 4 mg (has no administration in time range)  lactated ringers infusion ( Intravenous New Bag/Given 08/15/22 0648)  HYDROcodone-acetaminophen (NORCO/VICODIN) 5-325 MG per tablet 1-2 tablet (has no administration in time range)  morphine (PF) 2 MG/ML injection 2 mg (has no administration in time range)  hydrALAZINE (APRESOLINE) injection 5 mg (has no administration in time range)  piperacillin-tazobactam (ZOSYN) IVPB 3.375 g (has no administration in time range)  ondansetron (ZOFRAN) injection 4 mg (4 mg Intravenous Given 08/15/22 0034)  lactated ringers bolus  1,000 mL (0 mLs Intravenous Stopped 08/15/22 0149)  piperacillin-tazobactam (ZOSYN) IVPB 3.375 g (0 g Intravenous Stopped 08/15/22 0218)     IMPRESSION / MDM / ASSESSMENT AND PLAN / ED COURSE  I reviewed the triage vital signs and the nursing notes.  DDx: Cystitis, cholecystitis, dehydration, AKI, electrolyte abnormality, viral illness  Patient's presentation is most consistent with acute presentation with potential threat to life or bodily function.  Patient presents with upper abdominal pain, vomiting, difficulty with oral intake.  Patient declines pain medicine while obtaining labs, ultrasound, UA.Marland Kitchen   Clinical Course as of 08/15/22 0723  Wed Aug 15, 2022  0134 Ultrasound concerning for cholecystitis.  Will start Zosyn and plan to admit. [PS]  0210 D/w surgery Dr. Maia Plan who rec. Abx for now, if not improving would proceed to IR perc drain [PS]    Clinical Course User Index [PS] Sharman Cheek, MD     FINAL CLINICAL IMPRESSION(S) / ED DIAGNOSES   Final diagnoses:  Cholecystitis  Cystitis  Dehydration     Rx / DC Orders   ED Discharge Orders     None        Note:  This document was prepared using Dragon voice recognition software and may include unintentional dictation errors.   Sharman Cheek, MD 08/15/22 [phone]

## 2022-08-15 NOTE — Plan of Care (Signed)
Patient was seen and examined at bedside, pain is under control now S/p cholecystostomy catheter insertion done by IR Seen by general surgery, continue conservative management Disposition plan when cleared by general surgery. Follow biliary drain fluid culture

## 2022-08-15 NOTE — Consult Note (Signed)
Chief Complaint: Patient was seen in consultation today for acute cholecystitis  Referring Physician(s): Carolan Shiver, MD  Supervising Physician: Oley Balm  Patient Status: [facility] - In-pt  History of Present Illness: Tammy Dillon is a 89 y.o. female with PMH significant for hypertension and pacemaker placed in 2021 being seen today in relation to acute cholecystitis. Patient presented to [facility] ED on 08/14/22 with nausea, vomiting, and abdominal pain. Ultrasound revealed concern for acute cholecystitis. Patient was evaluated by Dr Hazle Quant and not felt to be a surgical candidate. Patient subsequently referred to IR for possible image-guided percutaneous cholecystostomy.   Past Medical History:  Diagnosis Date   Complication of anesthesia    Nausea/Vomiting   Hypertension     Past Surgical History:  Procedure Laterality Date   ERCP N/A 02/27/2019   Procedure: ENDOSCOPIC RETROGRADE CHOLANGIOPANCREATOGRAPHY (ERCP);  Surgeon: Vida Rigger, MD;  Location: [facility];  Service: Endoscopy;  Laterality: N/A;   PACEMAKER IMPLANT N/A 11/09/2019   Procedure: PACEMAKER IMPLANT;  Surgeon: Regan Lemming, MD;  Location: [facility] INVASIVE CV LAB;  Service: Cardiovascular;  Laterality: N/A;   REMOVAL OF STONES  02/27/2019   Procedure: REMOVAL OF STONES;  Surgeon: Vida Rigger, MD;  Location: [facility] ENDOSCOPY;  Service: Endoscopy;;   SPHINCTEROTOMY  02/27/2019   Procedure: Dennison Mascot;  Surgeon: Vida Rigger, MD;  Location: [facility] ENDOSCOPY;  Service: Endoscopy;;    Allergies: Aldara [imiquimod], Other, and Codeine  Medications: Prior to Admission medications   Medication Sig Start Date End Date Taking? Authorizing Provider  aspirin 81 MG EC tablet Take 81 mg by mouth every Monday, Tuesday, Wednesday, Thursday, and Friday. Take   Yes [provider]  desloratadine (CLARINEX) 5 MG tablet Take 5 mg by mouth every evening.  02/20/19  Yes [provider]   hydroxypropyl methylcellulose / hypromellose (ISOPTO TEARS / GONIOVISC) 2.5 % ophthalmic solution Place 1 drop into both eyes daily as needed for dry eyes.   Yes [provider]  levocetirizine (XYZAL) 5 MG tablet Take 5 mg by mouth every evening.   Yes [provider]  OVER THE COUNTER MEDICATION Take 7.5-10 mLs by mouth See admin instructions. Superior Elemental Collodial Silver - Chemical Free- Immune Support- Take 7.5-10 ml's by mouth once a day   Yes [provider]  raloxifene (EVISTA) 60 MG tablet Take 60 mg by mouth every evening. 02/20/19  Yes [provider]  rosuvastatin (CRESTOR) 5 MG tablet Take 5 mg by mouth every Monday, Wednesday, and Friday. In the evening 02/20/19  Yes [provider]  telmisartan-hydrochlorothiazide (MICARDIS HCT) 80-25 MG tablet Take 0.5 tablets by mouth 2 (two) times a week.  02/20/19  Yes [provider]  Cholecalciferol (VITAMIN D3) 50 MCG (2000 UT) TABS Take 2,000 Units by mouth every evening.    [provider]  donepezil (ARICEPT) 5 MG tablet Take 5 mg by mouth daily.    [provider]  ondansetron (ZOFRAN) 4 MG tablet Take 4 mg by mouth as needed. Patient not taking: Reported on 08/15/2022 12/31/19   [provider]     Family History  Problem Relation Age of Onset   Cancer Mother        breast   Cancer - Lung Father    Diabetes Son     Social History   Socioeconomic History   Marital status: Widowed    Spouse name: Not on file   Number of children: Not on file   Years of education: Not on  file   Highest education level: Not on file  Occupational History   Not on file  Tobacco Use   Smoking status: Not on file   Smokeless tobacco: Not on file  Substance and Sexual Activity   Alcohol use: Not on file   Drug use: Not on file   Sexual activity: Not on file  Other Topics Concern   Not on file  Social History Narrative   Not on file   Social Determinants of  Health   Financial Resource Strain: Not on file  Food Insecurity: No Food Insecurity (08/15/2022)   Hunger Vital Sign    Worried About Running Out of Food in the Last Year: Never true    Ran Out of Food in the Last Year: Never true  Transportation Needs: No Transportation Needs (08/15/2022)   PRAPARE - Administrator, Civil Service (Medical): No    Lack of Transportation (Non-Medical): No  Physical Activity: Not on file  Stress: Not on file  Social Connections: Not on file    Code Status: Full code  Review of Systems: A 12 point ROS discussed and pertinent positives are indicated in the HPI above.  All other systems are negative.  Review of Systems  Constitutional:  Negative for chills and fever.  Respiratory:  Negative for chest tightness and shortness of breath.   Cardiovascular:  Negative for chest pain and leg swelling.  Gastrointestinal:  Positive for abdominal pain, nausea and vomiting. Negative for diarrhea.  Neurological:  Negative for dizziness and headaches.  Psychiatric/Behavioral:  Positive for confusion.     Vital Signs: BP (!) 111/52   Pulse 91   Temp 98.5 F (36.9 C) (Oral)   Resp 14   Ht 4\' 11"  (1.499 m)   Wt 110 lb 3.7 oz (50 kg)   SpO2 95%   BMI 22.26 kg/m   Advance Care Plan: The advanced care plan/surrogate decision maker was discussed at the time of visit and documented in the medical record.    Physical Exam Vitals reviewed.  Constitutional:      General: She is not in acute distress.    Appearance: She is ill-appearing.  HENT:     Mouth/Throat:     Mouth: Mucous membranes are moist.  Eyes:     Pupils: Pupils are equal, round, and reactive to light.  Cardiovascular:     Rate and Rhythm: Normal rate and regular rhythm.     Pulses: Normal pulses.     Heart sounds: Normal heart sounds.  Pulmonary:     Effort: Pulmonary effort is normal.     Breath sounds: Normal breath sounds.  Abdominal:     Palpations: Abdomen is soft.      Tenderness: There is abdominal tenderness. There is no rebound.     Comments: RUQ tenderness  Musculoskeletal:     Right lower leg: No edema.     Left lower leg: No edema.  Skin:    General: Skin is warm and dry.  Neurological:     Mental Status: She is alert. Mental status is at baseline. She is disoriented.  Psychiatric:        Mood and Affect: Mood normal.        Behavior: Behavior normal.        Thought Content: Thought content normal.        Judgment: Judgment normal.     Imaging: US ABDOMEN LIMITED RUQ (LIVER/GB)  Result Date: 08/15/2022 CLINICAL DATA:  Right upper quadrant  pain, vomiting EXAM: ULTRASOUND ABDOMEN LIMITED RIGHT UPPER QUADRANT COMPARISON:  CT 02/11/2019 FINDINGS: Gallbladder: Gallstone measuring up to 1.9 cm. Gallbladder wall thickening measuring 9 mm. Trace pericholecystic fluid. Sludge within the gallbladder. Common bile duct: Diameter: Normal caliber, 4 mm. Liver: No focal lesion identified. Within normal limits in parenchymal echogenicity. Portal vein is patent on color Doppler imaging with normal direction of blood flow towards the liver. Other: None. IMPRESSION: Cholelithiasis, sludge within the gallbladder. Gallbladder wall thickening and small amount of pericholecystic fluid. Findings concerning for acute cholecystitis. Electronically Signed   By: Charlett Nose M.D.   On: 08/15/2022 01:20   CUP PACEART REMOTE DEVICE CHECK  Result Date: 08/08/2022 Scheduled remote reviewed. Normal device function.  Next remote 91 days. LA, CVRS   Labs:  CBC: Recent Labs    08/14/22 2118  WBC 26.1*  HGB 11.7*  HCT 34.8*  PLT 218    COAGS: No results for input(s): "INR", "APTT" in the last 8760 hours.  BMP: Recent Labs    08/14/22 2325  NA 133*  K 4.0  CL 99  CO2 25  GLUCOSE 158*  BUN 22  CALCIUM 9.3  CREATININE 0.84  GFRNONAA >60    LIVER FUNCTION TESTS: Recent Labs    08/14/22 2325  BILITOT 0.9  AST 30  ALT 14  ALKPHOS 47  PROT 7.3  ALBUMIN  3.9    TUMOR MARKERS: No results for input(s): "AFPTM", "CEA", "CA199", "CHROMGRNA" in the last 8760 hours.  Assessment and Plan: Tammy Dillon is a 89 yo female being seen today in relation to acute cholecystitis. Patient was found not to be a surgical candidate and subsequently referred to IR. Patient is confused on exam, but accompanied by her son who is able to act as historian and provide consent. Case was reviewed by Dr Deanne Coffer and approved for image-guided percutaneous cholecystostomy drain placement. Patient is NPO. PT/INR ordered and is pending. WBC of 26.1, Hgb is 11.7.  Risks and benefits discussed with the patient and her son including, but not limited to bleeding, infection, gallbladder perforation, bile leak, sepsis or even death.  All of the patient's questions were answered, patient is agreeable to proceed. Consent signed and in chart.    Thank you for this interesting consult.  I greatly enjoyed meeting MARGURET SLINGSBY and look forward to participating in their care.  A copy of this report was sent to the requesting provider on this date.  Electronically Signed: Kennieth Francois, PA-C 08/15/2022, 8:30 AM   I spent a total of 40 Minutes  in face to face in clinical consultation, greater than 50% of which was counseling/coordinating care for acute cholecystitis.

## 2022-08-15 NOTE — Assessment & Plan Note (Signed)
Troponin 76 suspect demand ischemia from acute illness Patient denies chest pain and EKG nonacute Continue to trend Consult cardiology if uptrending

## 2022-08-15 NOTE — ED Notes (Signed)
Pt ambulated to the toilet with assistance.  

## 2022-08-15 NOTE — ED Notes (Signed)
Report given to Alex H  RN

## 2022-08-15 NOTE — Assessment & Plan Note (Signed)
Continue Zosyn started in the emergency room Pain control, IV antiemetics IV fluids Keep n.p.o. Surgical consult

## 2022-08-16 DIAGNOSIS — K810 Acute cholecystitis: Secondary | ICD-10-CM | POA: Diagnosis not present

## 2022-08-16 LAB — BASIC METABOLIC PANEL
Anion gap: 10 (ref 5–15)
BUN: 28 mg/dL — ABNORMAL HIGH (ref 8–23)
CO2: 27 mmol/L (ref 22–32)
Calcium: 8.4 mg/dL — ABNORMAL LOW (ref 8.9–10.3)
Chloride: 99 mmol/L (ref 98–111)
Creatinine, Ser: 1.06 mg/dL — ABNORMAL HIGH (ref 0.44–1.00)
GFR, Estimated: 50 mL/min — ABNORMAL LOW (ref >60)
Glucose, Bld: 107 mg/dL — ABNORMAL HIGH (ref 70–99)
Potassium: 3.7 mmol/L (ref 3.5–5.1)
Sodium: 136 mmol/L (ref 135–145)

## 2022-08-16 LAB — CBC
HCT: 30.8 % — ABNORMAL LOW (ref 36.0–46.0)
Hemoglobin: 10.3 g/dL — ABNORMAL LOW (ref 12.0–15.0)
MCH: 31.2 pg (ref 26.0–34.0)
MCHC: 33.4 g/dL (ref 30.0–36.0)
MCV: 93.3 fL (ref 80.0–100.0)
Platelets: 166 10*3/uL (ref 150–400)
RBC: 3.3 MIL/uL — ABNORMAL LOW (ref 3.87–5.11)
RDW: 13.4 % (ref 11.5–15.5)
WBC: 17.5 10*3/uL — ABNORMAL HIGH (ref 4.0–10.5)
nRBC: 0 % (ref 0.0–0.2)

## 2022-08-16 LAB — HEPATIC FUNCTION PANEL
ALT: 14 U/L (ref 0–44)
AST: 27 U/L (ref 15–41)
Albumin: 2.7 g/dL — ABNORMAL LOW (ref 3.5–5.0)
Alkaline Phosphatase: 37 U/L — ABNORMAL LOW (ref 38–126)
Bilirubin, Direct: 0.2 mg/dL (ref 0.0–0.2)
Indirect Bilirubin: 0.5 mg/dL (ref 0.3–0.9)
Total Bilirubin: 0.7 mg/dL (ref 0.3–1.2)
Total Protein: 5.5 g/dL — ABNORMAL LOW (ref 6.5–8.1)

## 2022-08-16 LAB — MAGNESIUM: Magnesium: 1.7 mg/dL (ref 1.7–2.4)

## 2022-08-16 LAB — PHOSPHORUS: Phosphorus: 3 mg/dL (ref 2.5–4.6)

## 2022-08-16 LAB — AEROBIC/ANAEROBIC CULTURE W GRAM STAIN (SURGICAL/DEEP WOUND)

## 2022-08-16 MED ORDER — MAGNESIUM OXIDE -MG SUPPLEMENT 400 (240 MG) MG PO TABS
400.0000 mg | ORAL_TABLET | Freq: Two times a day (BID) | ORAL | Status: DC
  Administered 2022-08-16 – 2022-08-20 (×9): 400 mg via ORAL
  Filled 2022-08-16 (×9): qty 1

## 2022-08-16 MED ORDER — ENSURE ENLIVE PO LIQD
237.0000 mL | Freq: Three times a day (TID) | ORAL | Status: DC
  Administered 2022-08-16 (×3): 237 mL via ORAL

## 2022-08-16 NOTE — Progress Notes (Signed)
Physical Therapy Treatment Patient Details Name: Tammy Dillon MRN: [id number] DOB: 1929/11/06 Today's Date: 08/16/2022   History of Present Illness Pt is a 89 yo female that presented to ED for nausea, vomiting, abdominal pain. Percutaneous cholecystomy placed. PMH of dementia, pacemaker, HTN.    PT Comments    Patient alert, agreeable to PT, much more interactive and improved processing and initiation noted. Re-oriented to situation and place. Sit <> stand from recliner with RW and CGA, cued for hand placement with poor carryover. She was able to ambulate ~281ft with RW and CGA-supervision, no LOB noted though pt unable to dual task (unable to answer questions and continue to walk). Pt able to describe PLOF today as well as assistance at home. Overall the patient demonstrated great improvement in function and would benefit from continued skilled PT services to maximize safety, independence, and function.      Recommendations for follow up therapy are one component of a multi-disciplinary discharge planning process, led by the attending physician.  Recommendations may be updated based on patient status, additional functional criteria and insurance authorization.  Follow Up Recommendations  Can patient physically be transported by private vehicle: Yes    Assistance Recommended at Discharge Intermittent Supervision/Assistance  Patient can return home with the following Assistance with cooking/housework;Direct supervision/assist for medications management;Assist for transportation;Assistance with feeding;Help with stairs or ramp for entrance;A little help with walking and/or transfers;A little help with bathing/dressing/bathroom   Equipment Recommendations  Rolling walker (2 wheels) (TBD)    Recommendations for Other Services       Precautions / Restrictions Precautions Precautions: Fall;ICD/Pacemaker Precaution Comments: cholecystomy Restrictions Weight Bearing Restrictions: No      Mobility  Bed Mobility               General bed mobility comments: pt up in chair at start/end of session    Transfers Overall transfer level: Needs assistance Equipment used: Rolling walker (2 wheels) Transfers: Sit to/from Stand Sit to Stand: Min guard           General transfer comment: cued for hand placement with poor carryover    Ambulation/Gait Ambulation/Gait assistance: Min guard, Supervision Gait Distance (Feet): 200 Feet Assistive device: Rolling walker (2 wheels)         General Gait Details: progressed to supervision, pt unable to dual task (stopped walking to answer PT questions)   Stairs             Wheelchair Mobility    Modified Rankin (Stroke Patients Only)       Balance Overall balance assessment: Needs assistance Sitting-balance support: Feet supported, Bilateral upper extremity supported Sitting balance-Leahy Scale: Good     Standing balance support: During functional activity Standing balance-Leahy Scale: Fair                              Cognition Arousal/Alertness: Awake/alert Behavior During Therapy: WFL for tasks assessed/performed, Flat affect Overall Cognitive Status: History of cognitive impairments - at baseline                                 General Comments: pt oriented to place, name, visitor in room. re-oriented to situation and date        Exercises      General Comments        Pertinent Vitals/Pain Pain Assessment Pain Assessment: Faces Faces Pain Scale:  No hurt    Home Living                          Prior Function            PT Goals (current goals can now be found in the care plan section) Progress towards PT goals: Progressing toward goals    Frequency    Min 3X/week      PT Plan Discharge plan needs to be updated    Co-evaluation              AM-PAC PT "6 Clicks" Mobility   Outcome Measure  Help needed turning from your  back to your side while in a flat bed without using bedrails?: None Help needed moving from lying on your back to sitting on the side of a flat bed without using bedrails?: None Help needed moving to and from a bed to a chair (including a wheelchair)?: A Little Help needed standing up from a chair using your arms (e.g., wheelchair or bedside chair)?: None Help needed to walk in hospital room?: A Little Help needed climbing 3-5 steps with a railing? : A Little 6 Click Score: 21    End of Session Equipment Utilized During Treatment: Gait belt Activity Tolerance: Patient tolerated treatment well Patient left: with chair alarm set;in chair;with call bell/phone within reach;with family/visitor present Nurse Communication: Mobility status PT Visit Diagnosis: Other abnormalities of gait and mobility (R26.89);Muscle weakness (generalized) (M62.81)     Time: 1610-9604 PT Time Calculation (min) (ACUTE ONLY): 13 min  Charges:  $Therapeutic Activity: 8-22 mins                     Olga Coaster PT, DPT 10:44 AM,08/16/22

## 2022-08-16 NOTE — Progress Notes (Addendum)
[facility] Progress Note  Patient: Tammy Dillon    ONG:[id number]  DOA: 08/14/2022     Date of Service: the patient was seen and examined on 08/16/2022  Chief Complaint  Patient presents with   Nausea   Weakness   Emesis   Brief hospital course: Tammy Dillon is a 89 y.o. female with medical history significant for HTN, s/p pacemaker 2021 who presents to theED with upper abdominal pain, nausea and vomiting.  She denies fever or chills, diarrhea or dysuria.  Has no chest pain or shortness of breath. ED course and data review: Vitals within normal limits.  Labs notable for WBC of 26,000, lipase and LFTs WNL.  Urinalysis with trace leukocytes and many bacteria.  Troponin 76. EKG, personally viewed and interpreted showing NSR with RBBB RUQ ultrasound showing possible acute cholecystitis The ED provider: Spoke with surgeon Dr. Maia Plan who recommended admission to the medical service for antibiotics. Hospitalist consulted in the a.m.    Assessment and Plan: # Acute cholecystitis Continue Zosyn started in the emergency room Pain control, IV antiemetics IV fluids General surgery consulted, recommended cholecystostomy tube insertion by IR 5/29 s/p cholecystostomy catheter placement by IR, 20 mill purulent bile was removed and sent for culture Leukocytosis, WBC trending down. Wbc 20.4--17.5  5/30 as per general surgery may plan to discharge tomorrow a.m. on Augmentin twice daily for total 10 days course.  Follow-up with general surgery to remove the drain and lap chole as an outpatient    # Elevated troponin Troponin 76 suspect demand ischemia from acute illness Patient denies chest pain and EKG nonacute Consider cardiology if any cardiac issues   Essential hypertension Held telmisartan and hydrochlorothiazide for now due to soft blood pressure Use hydralazine as needed Monitor BP and titrate medications accordingly   History of cardiac pacemaker No acute issues  suspected   Body mass index is 22.26 kg/m.  Interventions:       Diet: Heart healthy diet DVT Prophylaxis: Subcutaneous Lovenox   Advance goals of care discussion: Full code  Family Communication: family was not present at bedside, at the time of interview.  The pt provided permission to discuss medical plan with the family. Opportunity was given to ask question and all questions were answered satisfactorily.  5/30 d/w patient's daughter Tammy Dillon, management plan discussed.  Possible discharge tomorrow a.m.  She mentioned that patient's older [name] will come to pick her on discharge possible tomorrow a.m.   Disposition:  Pt is from Home, admitted with acute cholecystitis s/p cholecystostomy tube insertion by IR, still on IV Zosyn, WBC count trending down, which precludes a safe discharge. Discharge to home, when clinically stable, may discharge tomorrow a.m. if stable and cleared by general surgery.  Subjective: No significant events overnight, patient's abdominal pain is well-controlled at rest, it hurts more when she moves, complaining of pain 5/10, no any chest pain or palpitation, no shortness of breath, no any other active issues.  Physical Exam: General: NAD, lying comfortably Appear in no distress, affect appropriate Eyes: PERRLA ENT: Oral Mucosa Clear, moist  Neck: no JVD,  Cardiovascular: S1 and S2 Present, no Murmur,  Respiratory: good respiratory effort, Bilateral Air entry equal and Decreased, no Crackles, no wheezes Abdomen: Bowel Sound present, Soft and RUQ tenderness, s/p cholecystostomy tube intact with bile draining in the bag Skin: no rashes Extremities: no Pedal edema, no calf tenderness Neurologic: without any new focal findings Gait not checked due to patient safety concerns  Vitals:  08/15/22 1637 08/15/22 1956 08/16/22 0436 08/16/22 0759  BP: (!) 96/49 (!) 101/44 (!) 109/52 (!) 126/55  Pulse: 81 81 79 89  Resp: 20 18 18 18   Temp: 98.9 F (37.2 C)  98.7 F (37.1 C) 98 F (36.7 C) 98 F (36.7 C)  TempSrc:    Oral  SpO2: 96% 94% 97% 97%  Weight:      Height:        Intake/Output Summary (Last 24 hours) at 08/16/2022 1428 Last data filed at 08/16/2022 1033 Gross per 24 hour  Intake 3497.6 ml  Output 275 ml  Net 3222.6 ml   Filed Weights   08/14/22 2116 08/15/22 0945  Weight: 50 kg 50 kg    Data Reviewed: I have personally reviewed and interpreted daily labs, tele strips, imagings as discussed above. I reviewed all nursing notes, pharmacy notes, vitals, pertinent old records I have discussed plan of care as described above with RN and patient/family.  CBC: Recent Labs  Lab 08/14/22 2118 08/15/22 1251 08/16/22 0400  WBC 26.1* 20.4* 17.5*  HGB 11.7* 12.8 10.3*  HCT 34.8* 38.3 30.8*  MCV 92.3 92.5 93.3  PLT 218 204 166   Basic Metabolic Panel: Recent Labs  Lab 08/14/22 2325 08/15/22 1251 08/16/22 0400  NA 133* 135 136  K 4.0 3.5 3.7  CL 99 98 99  CO2 25 26 27   GLUCOSE 158* 128* 107*  BUN 22 23 28*  CREATININE 0.84 0.98 1.06*  CALCIUM 9.3 9.0 8.4*  MG  --  1.7 1.7  PHOS  --  3.1 3.0    Studies: No results found.  Scheduled Meds:  donepezil  5 mg Oral Daily   enoxaparin (LOVENOX) injection  30 mg Subcutaneous Q24H   feeding supplement  237 mL Oral TID BM   lidocaine  10 mL Intradermal Once   magnesium oxide  400 mg Oral BID   sodium chloride flush  5 mL Intracatheter Q8H   Continuous Infusions:  lactated ringers 125 mL/hr at 08/16/22 0914   piperacillin-tazobactam (ZOSYN)  IV 12.5 mL/hr at 08/16/22 0914   PRN Meds: acetaminophen **OR** acetaminophen, hydrALAZINE, HYDROcodone-acetaminophen, iopamidol, morphine injection, ondansetron **OR** ondansetron (ZOFRAN) IV  Time spent: 35 minutes  Author: Gillis Santa. MD [facility] 08/16/2022 2:28 PM  To reach On-call, see care teams to locate the attending and reach out to them via [email or web address]. If 7PM-7AM, please contact night-coverage If  you still have difficulty reaching the attending provider, please page the [facility] (Director on Call) for [facility] on amion for assistance.

## 2022-08-16 NOTE — Progress Notes (Signed)
Patient ID: Tammy Dillon, female   DOB: 01-Apr-1929, 89 y.o.   MRN: [id number]     SURGICAL PROGRESS NOTE   Hospital Day(s): 1.   Interval History: Patient seen and examined, no acute events or new complaints overnight. Patient reports started having a little bit better.  She does feel sleepy today.  Denies any nausea or vomiting today.  She had small amount of breakfast.  No issues with the drain.  Patient had percutaneous cholecystostomy yesterday.  Tolerated the procedure.  Vital signs in last 24 hours: [min-max] current  Temp:  [98 F (36.7 C)-98.9 F (37.2 C)] 98 F (36.7 C) (05/30 0759) Pulse Rate:  [79-89] 89 (05/30 0759) Resp:  [18-20] 18 (05/30 0759) BP: (96-126)/(44-55) 126/55 (05/30 0759) SpO2:  [94 %-97 %] 97 % (05/30 0759)     Height: 4\' 11"  (149.9 cm) Weight: 50 kg BMI (Calculated): 22.25   Physical Exam:  Constitutional: alert, cooperative and no distress  Respiratory: breathing non-labored at rest  Cardiovascular: regular rate and sinus rhythm  Gastrointestinal: soft, non-tender, and non-distended.  Cholecystostomy tube in place.  Labs:     Latest Ref Rng & Units 08/16/2022    4:00 AM 08/15/2022   12:51 PM 08/14/2022    9:18 PM  CBC  WBC 4.0 - 10.5 K/uL 17.5  20.4  26.1   Hemoglobin 12.0 - 15.0 g/dL 69.6  29.5  28.4   Hematocrit 36.0 - 46.0 % 30.8  38.3  34.8   Platelets 150 - 400 K/uL 166  204  218       Latest Ref Rng & Units 08/16/2022    4:00 AM 08/15/2022   12:51 PM 08/14/2022   11:25 PM  CMP  Glucose 70 - 99 mg/dL 132  440  102   BUN 8 - 23 mg/dL 28  23  22    Creatinine 0.44 - 1.00 mg/dL 7.25  3.66  4.40   Sodium 135 - 145 mmol/L 136  135  133   Potassium 3.5 - 5.1 mmol/L 3.7  3.5  4.0   Chloride 98 - 111 mmol/L 99  98  99   CO2 22 - 32 mmol/L 27  26  25    Calcium 8.9 - 10.3 mg/dL 8.4  9.0  9.3   Total Protein 6.5 - 8.1 g/dL 5.5   7.3   Total Bilirubin 0.3 - 1.2 mg/dL 0.7   0.9   Alkaline Phos 38 - 126 U/L 37   47   AST 15 - 41 U/L 27   30   ALT 0  - 44 U/L 14   14     Imaging studies: No new pertinent imaging studies   Assessment/Plan:  89 y.o. female with acute cholecystitis status post percutaneous cholecystostomy, complicated by pertinent comorbidities including AV block Mobitz 2 with pacemaker, advanced age with early dementia, hypertension.   Patient responding well to IV antibiotic therapy and percutaneous cholecystostomy -White blood cell count decreasing trend -No fever -Slowly getting better appetite.  Still not eating enough. -I think the patient would benefit of 24 more hours of IV antibiotic therapy.  Will reevaluate tomorrow.  He is eating better and I think that she will be safe to transition to oral antibiotic therapy and discharged home with drain in place. -Encouraged the patient to ambulate  Gae Gallop, MD

## 2022-08-16 NOTE — TOC Initial Note (Signed)
Transition of Care [facility]) - Initial/Assessment Note    Patient Details  Name: Tammy Dillon MRN: [id number] Date of Birth: 1930-01-27  Transition of Care [facility]) CM/SW Contact:    Allena Katz, LCSW Phone Number: 08/16/2022, 11:38 AM  Clinical Narrative:    CSW spoke with [name] regarding care at home. [name] reports she has wanted assistance at home for her and is agreeable to [facility] services. [name] reports they do not have any preference for which agency is used. [name] reports pt has a supplemental insurance as well as a Database administrator, policy number [id number]. [name] reports they would like to utilize an agency to assist with placement in the future and also are interested in private duty care to assist at home. Pt offered choice on referral agencies, [name] chose to use [facility]. Referral faxed to [facility] with [facility]. CSW unsure of d/c at this time as we are waiting for fluid cultures. Pt active with Dr. Orson Aloe for Primary care. SDOH needs reviewed. No needs currently TOC will continue to follow.       11:00am Referral accepted for [facility] through [facility] for PT/OT/RN.  Expected Discharge Plan: Home w Home Health Services Barriers to Discharge: Continued Medical Work up   Patient Goals and CMS Choice Patient states their goals for this hospitalization and ongoing recovery are:: return home CMS Medicare.gov Compare Post Acute Care list provided to:: Patient Represenative (must comment) Elita Quick) Choice offered to / list presented to : [facility] POA / Guardian Elita Quick)      Expected Discharge Plan and Services                                     [facility] Agency: [facility] Date [facility] Agency Contacted: 08/16/22 Time HH Agency Contacted: 1100 Representative spoke with at [facility] Agency: Barbara Cower  Prior Living Arrangements/Services   Lives with:: Self Patient language and need for interpreter reviewed:: Yes Do you feel safe going back to the place where you live?: Yes      Need for Family  Participation in Patient Care: Yes (Comment) Care giver support system in place?: Yes (comment)   Criminal Activity/Legal Involvement Pertinent to Current Situation/Hospitalization: No - Comment as needed  Activities of Daily Living Home Assistive Devices/Equipment: Eyeglasses, Walker (specify type) ADL Screening (condition at time of admission) Patient's cognitive ability adequate to safely complete daily activities?: Yes Is the patient deaf or have difficulty hearing?: No Does the patient have difficulty seeing, even when wearing glasses/contacts?: No Does the patient have difficulty concentrating, remembering, or making decisions?: No Patient able to express need for assistance with ADLs?: Yes Does the patient have difficulty dressing or bathing?: No Independently performs ADLs?: No Communication: Independent Dressing (OT): Independent Grooming: Independent Feeding: Independent Bathing: Needs assistance Is this a change from baseline?: Pre-admission baseline Toileting: Needs assistance Is this a change from baseline?: Change from baseline, expected to last <3 days In/Out Bed: Needs assistance Is this a change from baseline?: Change from baseline, expected to last <3 days Walks in Home: Needs assistance Is this a change from baseline?: Change from baseline, expected to last <3 days Does the patient have difficulty walking or climbing stairs?: No Weakness of Legs: None Weakness of Arms/Hands: None  Permission Sought/Granted Permission sought to share information with : Case Manager, PCP, Magazine features editor    Share Information with NAME: family  Emotional Assessment       Orientation: : Fluctuating Orientation (Suspected and/or reported Sundowners)      Admission diagnosis:  Acute cholecystitis [K81.0] Dehydration [E86.0] Cholecystitis [K81.9] Cystitis [N30.90] Patient Active Problem List   Diagnosis Date Noted   Acute cholecystitis  08/15/2022   History of cardiac pacemaker 08/15/2022   Essential hypertension 08/15/2022   Elevated troponin 08/15/2022   Bradycardia    AV block, Mobitz II    Syncope and collapse 11/08/2019   PCP:  Emilio Aspen, MD Pharmacy:   [facility] [phone] Gin[place] Otto, [place] - [address] [place] [address] Phone: [phone] Fax: [phone]     Social Determinants of Health (SDOH) Social History: SDOH Screenings   Food Insecurity: No Food Insecurity (08/15/2022)  Housing: Low Risk  (08/15/2022)  Transportation Needs: No Transportation Needs (08/15/2022)  Utilities: Not At Risk (08/15/2022)   SDOH Interventions:     Readmission Risk Interventions     No data to display

## 2022-08-16 NOTE — Progress Notes (Signed)
Cholecystostomy tube will need to stay in place 8 weeks OR until interval cholecystectomy. Follow-up with surgery, if deemed a poor surgical candidate at that time, IR can perform cholecystostomy tube exchanges every 8 weeks. If the patient is not a good surgical candidate for cholecystectomy in the future, choledochoscopic-guided percutaneous gallstone retrieval with eventual tube removal is available by IR upon request.   Drain catheter has retained suture within catheter, prior to removal of drain cut off the hub to release the retention suture forming the pigtail.  Order for 8 week cholangiogram and tube exchange with IR has been placed, our staff will reach out to the patient for scheduling.  Berneta Levins, PA-C 08/16/2022, 11:56 AM

## 2022-08-16 NOTE — Progress Notes (Signed)
Referring Physician(s): Carolan Shiver, MD   Supervising Physician: Pernell Dupre  Chief Complaint: The patient is seen in follow up today s/p US/fluoro guided perc cholecystostomy catheter placement 5/29 in IR  History of present illness:  Doing well today--- sitting up in chair Little sore at drain site Drain intact OP bilious   Past Medical History:  Diagnosis Date   Complication of anesthesia    Nausea/Vomiting   Hypertension     Past Surgical History:  Procedure Laterality Date   ERCP N/A 02/27/2019   Procedure: ENDOSCOPIC RETROGRADE CHOLANGIOPANCREATOGRAPHY (ERCP);  Surgeon: Vida Rigger, MD;  Location: [facility];  Service: Endoscopy;  Laterality: N/A;   IR PERC CHOLECYSTOSTOMY  08/15/2022   PACEMAKER IMPLANT N/A 11/09/2019   Procedure: PACEMAKER IMPLANT;  Surgeon: Regan Lemming, MD;  Location: [facility] INVASIVE CV LAB;  Service: Cardiovascular;  Laterality: N/A;   REMOVAL OF STONES  02/27/2019   Procedure: REMOVAL OF STONES;  Surgeon: Vida Rigger, MD;  Location: [facility];  Service: Endoscopy;;   SPHINCTEROTOMY  02/27/2019   Procedure: Dennison Mascot;  Surgeon: Vida Rigger, MD;  Location: [facility];  Service: Endoscopy;;    Allergies: Aldara [imiquimod], Other, and Codeine  Medications: Prior to Admission medications   Medication Sig Start Date End Date Taking? Authorizing Provider  OVER THE COUNTER MEDICATION Take 7.5-10 mLs by mouth See admin instructions. Superior Elemental Collodial Silver - Chemical Free- Immune Support- Take 7.5-10 ml's by mouth once a day   Yes [provider]  oxybutynin (DITROPAN) 5 MG tablet Take 2.5 mg by mouth 2 (two) times daily.   Yes [provider]  rosuvastatin (CRESTOR) 5 MG tablet Take 5 mg by mouth daily. Take one tablet Monday to Friday. 02/20/19  Yes [provider]  telmisartan-hydrochlorothiazide (MICARDIS HCT) 80-25 MG tablet Take 0.5 tablets by mouth daily. Take 0.5 tablet  Monday through Friday in the morning. 02/20/19  Yes [provider]  zinc gluconate 50 MG tablet Take 25 mg by mouth daily.   Yes [provider]  aspirin 81 MG EC tablet Take 81 mg by mouth every Monday, Tuesday, Wednesday, Thursday, and Friday. Take Patient not taking: Reported on 08/15/2022    [provider]  Cholecalciferol (VITAMIN D3) 50 MCG (2000 UT) TABS Take 2,000 Units by mouth every evening.    [provider]  desloratadine (CLARINEX) 5 MG tablet Take 5 mg by mouth every evening.  02/20/19   [provider]  donepezil (ARICEPT) 5 MG tablet Take 5 mg by mouth daily.    [provider]  ondansetron (ZOFRAN) 4 MG tablet Take 4 mg by mouth as needed for nausea or vomiting. 12/31/19   [provider]  raloxifene (EVISTA) 60 MG tablet Take 60 mg by mouth every evening. 02/20/19   [provider]     Family History  Problem Relation Age of Onset   Cancer Mother        breast   Cancer - Lung Father    Diabetes Son     Social History   Socioeconomic History   Marital status: Widowed    Spouse name: Not on file   Number of children: Not on file   Years of education: Not on file   Highest education level: Not on file  Occupational History   Not on file  Tobacco Use   Smoking status: Unknown   Smokeless tobacco: Not on file  Substance and Sexual Activity   Alcohol use: Not on file  Drug use: Not on file   Sexual activity: Not on file  Other Topics Concern   Not on file  Social History Narrative   Not on file   Social Determinants of Health   Financial Resource Strain: Not on file  Food Insecurity: No Food Insecurity (08/15/2022)   Hunger Vital Sign    Worried About Running Out of Food in the Last Year: Never true    Ran Out of Food in the Last Year: Never true  Transportation Needs: No Transportation Needs (08/15/2022)   PRAPARE - Administrator, Civil Service (Medical): No    Lack of  Transportation (Non-Medical): No  Physical Activity: Not on file  Stress: Not on file  Social Connections: Not on file     Vital Signs: BP (!) 126/55 (BP Location: Right Arm)   Pulse 89   Temp 98 F (36.7 C) (Oral)   Resp 18   Ht 4\' 11"  (1.499 m)   Wt 110 lb 3.7 oz (50 kg)   SpO2 97%   BMI 22.26 kg/m   Physical Exam Skin:    General: Skin is warm.     Comments: Site is clean and dry; NT No bleeding No sign of infection OP 275 cc so far Bilious material Flushes easily  Gram Stain ABUNDANT WBC PRESENT, PREDOMINANTLY PMN NO ORGANISMS SEEN       Imaging: IR Perc Cholecystostomy  Result Date: 08/15/2022 CLINICAL DATA:  Acute calculus cholecystitis EXAM: PERCUTANEOUS CHOLECYSTOSTOMY TUBE PLACEMENT WITH ULTRASOUND AND FLUOROSCOPIC GUIDANCE FLUOROSCOPY: Radiation Exposure Index (as provided by the fluoroscopic device): 1.3 mGy air Kerma TECHNIQUE: The procedure, risks (including but not limited to bleeding, infection, organ damage ), benefits, and alternatives were explained to the patient and son. Questions regarding the procedure were encouraged and answered. The son understands and consents to the procedure. Survey ultrasound of the abdomen was performed and an appropriate skin entry site was identified. Skin site was marked, prepped with Betadine, and draped in usual sterile fashion, and infiltrated locally with 1% lidocaine. Intravenous Fentanyl and Versed 0.5mg  were administered as conscious sedation during continuous monitoring of the patient's level of consciousness and physiological / cardiorespiratory status by the radiology RN, with a total moderate sedation time of 10 minutes. Under real-time ultrasound guidance, gallbladder was accessed using a transhepatic approach with a 21-gauge needle. Ultrasound image documentation was saved. Bile returned through the hub. Needle was exchanged over a 018 guidewire for transitional dilator which allowed placement of 035 J wire.  Over this, a 10.2 French pigtail catheter was advanced and formed centrally in the gallbladder lumen. Small contrast injection confirmed appropriate position. Catheter secured externally with 0 Prolene suture and placed to external drain bag. Patient tolerated the procedure well. COMPLICATIONS: COMPLICATIONS none IMPRESSION: 1. Technically successful percutaneous cholecystostomy tube placement with ultrasound and fluoroscopic guidance. Electronically Signed   By: Corlis Leak M.D.   On: 08/15/2022 11:41   US ABDOMEN LIMITED RUQ (LIVER/GB)  Result Date: 08/15/2022 CLINICAL DATA:  Right upper quadrant pain, vomiting EXAM: ULTRASOUND ABDOMEN LIMITED RIGHT UPPER QUADRANT COMPARISON:  CT 02/11/2019 FINDINGS: Gallbladder: Gallstone measuring up to 1.9 cm. Gallbladder wall thickening measuring 9 mm. Trace pericholecystic fluid. Sludge within the gallbladder. Common bile duct: Diameter: Normal caliber, 4 mm. Liver: No focal lesion identified. Within normal limits in parenchymal echogenicity. Portal vein is patent on color Doppler imaging with normal direction of blood flow towards the liver. Other: None. IMPRESSION: Cholelithiasis, sludge within the gallbladder. Gallbladder wall thickening  and small amount of pericholecystic fluid. Findings concerning for acute cholecystitis. Electronically Signed   By: Charlett Nose M.D.   On: 08/15/2022 01:20    Labs:  CBC: Recent Labs    08/14/22 2118 08/15/22 1251 08/16/22 0400  WBC 26.1* 20.4* 17.5*  HGB 11.7* 12.8 10.3*  HCT 34.8* 38.3 30.8*  PLT 218 204 166    COAGS: Recent Labs    08/15/22 1251  INR 1.3*    BMP: Recent Labs    08/14/22 2325 08/15/22 1251 08/16/22 0400  NA 133* 135 136  K 4.0 3.5 3.7  CL 99 98 99  CO2 25 26 27   GLUCOSE 158* 128* 107*  BUN 22 23 28*  CALCIUM 9.3 9.0 8.4*  CREATININE 0.84 0.98 1.06*  GFRNONAA >60 55* 50*    LIVER FUNCTION TESTS: Recent Labs    08/14/22 2325 08/16/22 0400  BILITOT 0.9 0.7  AST 30 27  ALT  14 14  ALKPHOS 47 37*  PROT 7.3 5.5*  ALBUMIN 3.9 2.7*    Assessment and Plan:  Percutaneous cholecystostomy drain in place Will follow Plans per Dr Maia PlanTrisha Mangle Continue to flush drain 5-10 cc daily    Electronically Signed: Robet Leu 08/16/2022, 11:02 AM   I spent a total of 15 minutes in face to face in clinical consultation, greater than 50% of which was counseling/coordinating care for perc chole drain

## 2022-08-17 DIAGNOSIS — K810 Acute cholecystitis: Secondary | ICD-10-CM | POA: Diagnosis not present

## 2022-08-17 LAB — CBC
HCT: 27.1 % — ABNORMAL LOW (ref 36.0–46.0)
Hemoglobin: 9.3 g/dL — ABNORMAL LOW (ref 12.0–15.0)
MCH: 31.3 pg (ref 26.0–34.0)
MCHC: 34.3 g/dL (ref 30.0–36.0)
MCV: 91.2 fL (ref 80.0–100.0)
Platelets: 155 10*3/uL (ref 150–400)
RBC: 2.97 MIL/uL — ABNORMAL LOW (ref 3.87–5.11)
RDW: 13.2 % (ref 11.5–15.5)
WBC: 11.1 10*3/uL — ABNORMAL HIGH (ref 4.0–10.5)
nRBC: 0.2 % (ref 0.0–0.2)

## 2022-08-17 LAB — BASIC METABOLIC PANEL
Anion gap: 7 (ref 5–15)
BUN: 26 mg/dL — ABNORMAL HIGH (ref 8–23)
CO2: 27 mmol/L (ref 22–32)
Calcium: 8.5 mg/dL — ABNORMAL LOW (ref 8.9–10.3)
Chloride: 101 mmol/L (ref 98–111)
Creatinine, Ser: 0.9 mg/dL (ref 0.44–1.00)
GFR, Estimated: >60 mL/min (ref >60)
Glucose, Bld: 88 mg/dL (ref 70–99)
Potassium: 3.2 mmol/L — ABNORMAL LOW (ref 3.5–5.1)
Sodium: 135 mmol/L (ref 135–145)

## 2022-08-17 LAB — HEPATIC FUNCTION PANEL
ALT: 18 U/L (ref 0–44)
AST: 27 U/L (ref 15–41)
Albumin: 2.3 g/dL — ABNORMAL LOW (ref 3.5–5.0)
Alkaline Phosphatase: 37 U/L — ABNORMAL LOW (ref 38–126)
Bilirubin, Direct: 0.1 mg/dL (ref 0.0–0.2)
Indirect Bilirubin: 0.4 mg/dL (ref 0.3–0.9)
Total Bilirubin: 0.5 mg/dL (ref 0.3–1.2)
Total Protein: 5.4 g/dL — ABNORMAL LOW (ref 6.5–8.1)

## 2022-08-17 LAB — MAGNESIUM: Magnesium: 1.9 mg/dL (ref 1.7–2.4)

## 2022-08-17 LAB — PHOSPHORUS: Phosphorus: 2.1 mg/dL — ABNORMAL LOW (ref 2.5–4.6)

## 2022-08-17 LAB — AEROBIC/ANAEROBIC CULTURE W GRAM STAIN (SURGICAL/DEEP WOUND)

## 2022-08-17 MED ORDER — ADULT MULTIVITAMIN W/MINERALS CH
1.0000 | ORAL_TABLET | Freq: Every day | ORAL | Status: DC
  Administered 2022-08-17 – 2022-08-20 (×4): 1 via ORAL
  Filled 2022-08-17 (×4): qty 1

## 2022-08-17 MED ORDER — AMOXICILLIN-POT CLAVULANATE 500-125 MG PO TABS
1.0000 | ORAL_TABLET | Freq: Two times a day (BID) | ORAL | Status: DC
  Administered 2022-08-17 – 2022-08-18 (×2): 1 via ORAL
  Filled 2022-08-17 (×2): qty 1

## 2022-08-17 MED ORDER — ENSURE ENLIVE PO LIQD
237.0000 mL | Freq: Two times a day (BID) | ORAL | Status: DC
  Administered 2022-08-17 – 2022-08-20 (×8): 237 mL via ORAL

## 2022-08-17 MED ORDER — POTASSIUM CHLORIDE CRYS ER 20 MEQ PO TBCR
60.0000 meq | EXTENDED_RELEASE_TABLET | Freq: Once | ORAL | Status: AC
  Administered 2022-08-17: 60 meq via ORAL
  Filled 2022-08-17: qty 3

## 2022-08-17 NOTE — Progress Notes (Signed)
Physical Therapy Treatment Patient Details Name: Tammy Dillon MRN: [id number] DOB: 05-24-1929 Today's Date: 08/17/2022   History of Present Illness Pt is a 89 yo female that presented to ED for nausea, vomiting, abdominal pain. Percutaneous cholecystomy placed. PMH of dementia, pacemaker, HTN.    PT Comments    Patient alert, agreeable to PT, oriented to self, month, situation (much more so than previous sessions). Reported R sided pain throughout mobility, mild pain signs/symptoms noted. She was able to perform bed mobility with supervision, use of bed rails. Sit <> stand from EOB and from low commode, CGA with RW cued for hand placement. Able to perform her own seated pericare and wash her hands at the sink. She ambulated ~27ft total with RW and CGA, no LOB, decreased gait velocity noted. Returned to room with needs in reach. The patient would benefit from further skilled PT intervention to continue to progress towards goals.     Recommendations for follow up therapy are one component of a multi-disciplinary discharge planning process, led by the attending physician.  Recommendations may be updated based on patient status, additional functional criteria and insurance authorization.  Follow Up Recommendations  Can patient physically be transported by private vehicle: Yes    Assistance Recommended at Discharge Intermittent Supervision/Assistance  Patient can return home with the following Assistance with cooking/housework;Direct supervision/assist for medications management;Assist for transportation;Assistance with feeding;Help with stairs or ramp for entrance;A little help with walking and/or transfers;A little help with bathing/dressing/bathroom   Equipment Recommendations  Rolling walker (2 wheels);BSC/3in1    Recommendations for Other Services       Precautions / Restrictions Precautions Precautions: Fall;ICD/Pacemaker Restrictions Weight Bearing Restrictions: No      Mobility  Bed Mobility Overal bed mobility: Needs Assistance Bed Mobility: Supine to Sit       Sit to supine: Supervision   General bed mobility comments: use of bed rails,extra time    Transfers Overall transfer level: Needs assistance Equipment used: Rolling walker (2 wheels) Transfers: Sit to/from Stand Sit to Stand: Min guard Stand pivot transfers: Supervision              Ambulation/Gait Ambulation/Gait assistance: Min guard Gait Distance (Feet): 250 Feet Assistive device: Rolling walker (2 wheels)             Stairs             Wheelchair Mobility    Modified Rankin (Stroke Patients Only)       Balance Overall balance assessment: Needs assistance Sitting-balance support: Feet supported, Bilateral upper extremity supported Sitting balance-Leahy Scale: Good Sitting balance - Comments: pericare in sitting without assist     Standing balance-Leahy Scale: Fair Standing balance comment: able to stand and wash her hands at the sink                            Cognition Arousal/Alertness: Awake/alert Behavior During Therapy: WFL for tasks assessed/performed, Flat affect Overall Cognitive Status: History of cognitive impairments - at baseline                                 General Comments: pt with improved processing and orientation today        Exercises      General Comments        Pertinent Vitals/Pain Pain Assessment Pain Assessment: Faces Faces Pain Scale: Hurts little more Pain Location:  abdomen Pain Descriptors / Indicators: Grimacing, Guarding, Sore Pain Intervention(s): Limited activity within patient's tolerance, Monitored during session, Repositioned    Home Living                          Prior Function            PT Goals (current goals can now be found in the care plan section) Progress towards PT goals: Progressing toward goals    Frequency    Min 3X/week       PT Plan Current plan remains appropriate    Co-evaluation              AM-PAC PT "6 Clicks" Mobility   Outcome Measure  Help needed turning from your back to your side while in a flat bed without using bedrails?: None Help needed moving from lying on your back to sitting on the side of a flat bed without using bedrails?: A Little Help needed moving to and from a bed to a chair (including a wheelchair)?: A Little Help needed standing up from a chair using your arms (e.g., wheelchair or bedside chair)?: A Little Help needed to walk in hospital room?: A Little Help needed climbing 3-5 steps with a railing? : A Little 6 Click Score: 19    End of Session Equipment Utilized During Treatment: Gait belt Activity Tolerance: Patient tolerated treatment well Patient left: with chair alarm set;in chair;with call bell/phone within reach Nurse Communication: Mobility status PT Visit Diagnosis: Other abnormalities of gait and mobility (R26.89);Muscle weakness (generalized) (M62.81)     Time: 1610-9604 PT Time Calculation (min) (ACUTE ONLY): 20 min  Charges:  $Therapeutic Activity: 8-22 mins                     Olga Coaster PT, DPT 3:22 PM,08/17/22

## 2022-08-17 NOTE — NC FL2 (Signed)
[place] MEDICAID FL2 LEVEL OF CARE FORM     IDENTIFICATION  Patient Name: Tammy Dillon Birthdate: 09-22-1929 Sex: female Admission Date (Current Location): 08/14/2022  [facility] and [place] Number:  Chiropodist and Address:  [facility], [address] [place], [place] [address]      Provider Number: [id number]  Attending Physician Name and Address:  Kathrynn Running, MD  Relative Name and Phone Number:  [name], [name] [phone])  [phone] [facility])    Current Level of Care: Hospital Recommended Level of Care: Skilled Nursing Facility Prior Approval Number:    Date Approved/Denied:   PASRR Number: [id number]  Discharge Plan:      Current Diagnoses: Patient Active Problem List   Diagnosis Date Noted   Acute cholecystitis 08/15/2022   History of cardiac pacemaker 08/15/2022   Essential hypertension 08/15/2022   Elevated troponin 08/15/2022   Bradycardia    AV block, Mobitz II    Syncope and collapse 11/08/2019    Orientation RESPIRATION BLADDER Height & Weight     Self, Place  Normal   Weight: 110 lb 3.7 oz (50 kg) Height:  4\' 11"  (149.9 cm)  BEHAVIORAL SYMPTOMS/MOOD NEUROLOGICAL BOWEL NUTRITION STATUS        Diet (regular)  AMBULATORY STATUS COMMUNICATION OF NEEDS Skin   Limited Assist Verbally Bruising                       Personal Care Assistance Level of Assistance  Bathing, Feeding, Dressing Bathing Assistance: Limited assistance Feeding assistance: Limited assistance Dressing Assistance: Limited assistance     Functional Limitations Info             SPECIAL CARE FACTORS FREQUENCY  PT (By licensed PT), OT (By licensed OT)     PT Frequency: 5 times per week OT Frequency: 5 times per week            Contractures      Additional Factors Info  Code Status, Allergies Code Status Info: full Allergies Info: Aldara (Imiquimod), Other, Codeine           Current Medications (08/17/2022):  This is  the current hospital active medication list Current Facility-Administered Medications  Medication Dose Route Frequency Provider Last Rate Last Admin   acetaminophen (TYLENOL) tablet 650 mg  650 mg Oral Q6H PRN Andris Baumann, MD       Or   acetaminophen (TYLENOL) suppository 650 mg  650 mg Rectal Q6H PRN Andris Baumann, MD       amoxicillin-clavulanate (AUGMENTIN) 500-125 MG per tablet 1 tablet  1 tablet Oral BID Wouk, Wilfred Curtis, MD       donepezil (ARICEPT) tablet 5 mg  5 mg Oral Daily Gillis Santa, MD   5 mg at 08/17/22 0923   enoxaparin (LOVENOX) injection 30 mg  30 mg Subcutaneous Q24H Ronnald Ramp, RPH   30 mg at 08/17/22 2355   feeding supplement (ENSURE ENLIVE / ENSURE PLUS) liquid 237 mL  237 mL Oral BID BM Kathrynn Running, MD   237 mL at 08/17/22 1204   hydrALAZINE (APRESOLINE) injection 5 mg  5 mg Intravenous Q4H PRN Andris Baumann, MD       HYDROcodone-acetaminophen (NORCO/VICODIN) 5-325 MG per tablet 1-2 tablet  1-2 tablet Oral Q4H PRN Andris Baumann, MD   2 tablet at 08/16/22 1809   iopamidol (ISOVUE-300) 61 % injection 5 mL  5 mL Other Once PRN Oley Balm, MD  lidocaine (XYLOCAINE) 1 % (with pres) injection 10 mL  10 mL Intradermal Once Oley Balm, MD       magnesium oxide (MAG-OX) tablet 400 mg  400 mg Oral BID Gillis Santa, MD   400 mg at 08/17/22 4098   morphine (PF) 2 MG/ML injection 2 mg  2 mg Intravenous Q2H PRN Andris Baumann, MD       multivitamin with minerals tablet 1 tablet  1 tablet Oral Daily Kathrynn Running, MD   1 tablet at 08/17/22 0923   ondansetron [facility]) tablet 4 mg  4 mg Oral Q6H PRN Andris Baumann, MD   4 mg at 08/16/22 1809   Or   ondansetron [facility]) injection 4 mg  4 mg Intravenous Q6H PRN Andris Baumann, MD       sodium chloride flush (NS) 0.9 % injection 5 mL  5 mL Intracatheter Q8H Oley Balm, MD   5 mL at 08/17/22 1348     Discharge Medications: Please see discharge summary for a list of discharge  medications.  Relevant Imaging Results:  Relevant Lab Results:   Additional Information SS #: [id number]  Loghan Subia E Aldine Grainger, LCSW

## 2022-08-17 NOTE — Progress Notes (Signed)
Pharmacy Antibiotic Note  Tammy Dillon is a 89 y.o. female admitted on 08/14/2022 with intra-abdominal infection.  Pharmacy has been consulted for Zosyn dosing. S/p US/fluoro guided perc cholecystostomy catheter placement  on 5/29. Bile cx: FEW KLEBSIELLA PNEUMONIAE.   Plan: Day 3 of abx. Continue Zosyn 3.375g IV q8h (4 hour infusion). Plan to switch to PO abx after evaluation.   Pharmacy will continue to follow and will adjust abx dosing whenever warranted.  Temp (24hrs), Avg:98 F (36.7 C), Min:97.6 F (36.4 C), Max:98.4 F (36.9 C)   Recent Labs  Lab 08/14/22 2118 08/14/22 2325 08/15/22 1251 08/16/22 0400 08/17/22 0518  WBC 26.1*  --  20.4* 17.5* 11.1*  CREATININE  --  0.84 0.98 1.06* 0.90     Estimated Creatinine Clearance: 28.3 mL/min (by C-G formula based on SCr of 0.9 mg/dL).    Allergies  Allergen Reactions   Aldara [Imiquimod] Other (See Comments)    SEVERE DISCOLORATION OF THE SKIN- STOPPED BY A MD   Other Nausea And Vomiting and Other (See Comments)    Patient requires anti-nausea medication with most ANY medication, INCLUDING anesthesia- has had this issue her whole life   Codeine Nausea And Vomiting    Antimicrobials this admission: 5/29 Zosyn >>   Microbiology results: No lab cx currently ordered or pending at this time.  Thank you for allowing pharmacy to be a part of this patient's care.  Paschal Dopp, PharmD, 08/17/2022 10:08 AM

## 2022-08-17 NOTE — Care Management Important Message (Signed)
Important Message  Patient Details  Name: Tammy Dillon MRN: [id number] Date of Birth: 08-28-1929   Medicare Important Message Given:  Yes     [name] 08/17/2022, 11:05 AM

## 2022-08-17 NOTE — TOC Progression Note (Addendum)
Transition of Care [facility]) - Progression Note    Patient Details  Name: Tammy Dillon MRN: [id number] Date of Birth: 12-Nov-1929  Transition of Care [facility]) CM/SW Contact  Liliana Cline, LCSW Phone Number: 08/17/2022, 11:02 AM  Clinical Narrative:    CSW spoke with [name]. Tammy Dillon says the family want patient to go to short term rehab before returning home if possible. They are concerned about being able to manage her at home right now. I explained it is based on PT recs. Tammy Dillon requested PT see patient today to reeval and see if SNF would be appropriate. They prefer [facility] in [place] if so. Care Team updated.   2:38- MD spoke to son regarding PT reeval, PT still recommending HH. RN to do training with family on drain tomorrow morning per MD. Debroah Loop with [facility] of plan for DC tomorrow. Referral made to [facility] with [facility] for RW and [facility], notified him of DC tomorrow as well.    Expected Discharge Plan: Home w Home Health Services Barriers to Discharge: Continued Medical Work up  Expected Discharge Plan and Services                                     [facility] Agency: [facility] ([facility]) Date [facility] Agency Contacted: 08/16/22 Time HH Agency Contacted: 1100 Representative spoke with at [facility] Agency: Barbara Cower   Social Determinants of Health (SDOH) Interventions SDOH Screenings   Food Insecurity: No Food Insecurity (08/15/2022)  Housing: Low Risk  (08/15/2022)  Transportation Needs: No Transportation Needs (08/15/2022)  Utilities: Not At Risk (08/15/2022)    Readmission Risk Interventions     No data to display

## 2022-08-17 NOTE — Progress Notes (Signed)
Occupational Therapy Treatment Patient Details Name: Tammy Dillon MRN: [id number] DOB: 12/12/1929 Today's Date: 08/17/2022   History of present illness Pt is a 89 yo female that presented to ED for nausea, vomiting, abdominal pain. Percutaneous cholecystomy placed. PMH of dementia, pacemaker, HTN.   OT comments  Upon entering the room, pt seated in recliner chair and requests assistance to return to bed secondary to fatigue. Pt standing from from recliner chair with min guard and taking several steps to bed with RW and min guard. Pt sits on EOB for some time. She laterally scoots herself along EOB to get closer to pillow without assistance. Sit >supine with supervision and min cuing for technique. Call bell and all needed items within reach.    Recommendations for follow up therapy are one component of a multi-disciplinary discharge planning process, led by the attending physician.  Recommendations may be updated based on patient status, additional functional criteria and insurance authorization.    Assistance Recommended at Discharge Intermittent Supervision/Assistance  Patient can return home with the following  A little help with walking and/or transfers;Assistance with cooking/housework;Assist for transportation;A little help with bathing/dressing/bathroom   Equipment Recommendations  BSC/3in1;Other (comment) (RW)       Precautions / Restrictions Precautions Precautions: Fall;ICD/Pacemaker Precaution Comments: cholecystomy Restrictions Weight Bearing Restrictions: No       Mobility Bed Mobility Overal bed mobility: Needs Assistance Bed Mobility: Sit to Supine       Sit to supine: Supervision        Transfers Overall transfer level: Needs assistance Equipment used: Rolling walker (2 wheels) Transfers: Sit to/from Stand, Bed to chair/wheelchair/BSC Sit to Stand: Min guard Stand pivot transfers: Supervision               Balance Overall balance assessment:  Needs assistance Sitting-balance support: Feet supported, Bilateral upper extremity supported Sitting balance-Leahy Scale: Good     Standing balance support: During functional activity, Reliant on assistive device for balance Standing balance-Leahy Scale: Fair                             ADL either performed or assessed with clinical judgement    Extremity/Trunk Assessment Upper Extremity Assessment Upper Extremity Assessment: Generalized weakness   Lower Extremity Assessment Lower Extremity Assessment: Generalized weakness        Vision Patient Visual Report: No change from baseline            Cognition Arousal/Alertness: Awake/alert Behavior During Therapy: WFL for tasks assessed/performed, Flat affect Overall Cognitive Status: History of cognitive impairments - at baseline                                                     Pertinent Vitals/ Pain       Pain Assessment Pain Assessment: Faces Faces Pain Scale: Hurts a little bit Pain Location: abdomen Pain Descriptors / Indicators: Sore, Discomfort Pain Intervention(s): Monitored during session, Repositioned         Frequency  Min 2X/week        Progress Toward Goals  OT Goals(current goals can now be found in the care plan section)  Progress towards OT goals: Progressing toward goals     Plan Frequency remains appropriate;Discharge plan needs to be updated       AM-PAC OT "  6 Clicks" Daily Activity     Outcome Measure   Help from another person eating meals?: None Help from another person taking care of personal grooming?: A Little Help from another person toileting, which includes using toliet, bedpan, or urinal?: A Little Help from another person bathing (including washing, rinsing, drying)?: A Little Help from another person to put on and taking off regular upper body clothing?: None Help from another person to put on and taking off regular lower body clothing?:  A Little 6 Click Score: 20    End of Session Equipment Utilized During Treatment: Rolling walker (2 wheels)  OT Visit Diagnosis: Unsteadiness on feet (R26.81);Muscle weakness (generalized) (M62.81)   Activity Tolerance Patient limited by fatigue;Patient tolerated treatment well   Patient Left in bed;with call bell/phone within reach;with bed alarm set   Nurse Communication Mobility status        Time: 1610-9604 OT Time Calculation (min): 18 min  Charges: OT General Charges $OT Visit: 1 Visit OT Treatments $Therapeutic Activity: 8-22 mins  Jackquline Denmark, MS, OTR/L , CBIS ascom [phone]  08/17/22, 3:53 PM

## 2022-08-17 NOTE — TOC Progression Note (Addendum)
Transition of Care [facility]) - Progression Note    Patient Details  Name: Tammy Dillon MRN: [id number] Date of Birth: 09-26-1929  Transition of Care [facility]) CM/SW Contact  Liliana Cline, LCSW Phone Number: 08/17/2022, 4:08 PM  Clinical Narrative:    Call from daughter Derrill Memo discussed the family being concerned about taking patient home and really feels she needs some short term rehab. Spoke to [facility] Supervisor then updated [name] that per [employer]- we can work patient up for SNF and see if she gets bed offers. Not sure how long patient would be able to stay since she is doing well with PT at the hospital, it may be a few days. Family would need to choose a bed offer tomorrow if any are available since she will be medically ready for DC tomorrow and there are limited SNFs who will admit patients on the weekends.   [name] verbalized understanding. She requested CSW reach out to [employer] and is getting other SNFs they are interested in.  Updated Jon with [facility] to put a hold on DME order in case patient goes to rehab.   4:22- Spoke to Patrick at [facility], she states they do not have beds available at this time. French Ana states if family chooses another SNF, the family could reach out next week about transferring patient there if they have beds at that time. Updated [name]. She is going to talk to family and see what other SNFs they are interested in and call CSW back.  4:30- Call from [facility] who states she spoke with her brother and they decided they want to take patient home with home health. She asked what they do if they decide they want patient to be placed once she is home, requested MD add SW to [facility] orders. Provided Tracey at [employer]' Admissions # with her permission. Notified Barbara Cower with [facility] of DC home tomorrow and adding SW. Also notified Barbara Cower that [facility] will need to call patient's son [name] for scheduling.  Updated Jon with [facility] to resume DME delivery.    Expected Discharge Plan: Home  w Home Health Services Barriers to Discharge: Continued Medical Work up  Expected Discharge Plan and Services                                     [facility] Agency: [facility] Date [facility] Agency Contacted: 08/16/22 Time HH Agency Contacted: 1100 Representative spoke with at [facility] Agency: Barbara Cower   Social Determinants of Health (SDOH) Interventions SDOH Screenings   Food Insecurity: No Food Insecurity (08/15/2022)  Housing: Low Risk  (08/15/2022)  Transportation Needs: No Transportation Needs (08/15/2022)  Utilities: Not At Risk (08/15/2022)    Readmission Risk Interventions     No data to display

## 2022-08-17 NOTE — Progress Notes (Signed)
Initial Nutrition Assessment  DOCUMENTATION CODES:   Not applicable  INTERVENTION:   -Liberalize diet to regular for widest variety of meal selections -MVI with minerals daily -Ensure Enlive po BID, each supplement provides 350 kcal and 20 grams of protein  NUTRITION DIAGNOSIS:   Inadequate oral intake related to poor appetite as evidenced by per patient/family report.  GOAL:   Patient will meet greater than or equal to 90% of their needs  MONITOR:   PO intake, Supplement acceptance  REASON FOR ASSESSMENT:   Consult Assessment of nutrition requirement/status, Poor PO  ASSESSMENT:   Pt with medical history significant for HTN, s/p pacemaker 2021 who presents with upper abdominal pain, nausea and vomiting.  Pt admitted with acute cholecystitis.   5/29- s/p perc chole drain placement by IR  Reviewed I/O's: +2 L x 24 hours and +5.8 L since admission  Drain output: 95 ml x 24 hours   Spoke with pt and son at bedside. Pt reports feeling a little better today, but endorses poor appetite and not feeling like eating. Per pt son, PTA pt has a good appetite and usually consumes 2-3 meals per day. Pt consumed a hot dog the day of admission, which lead to abdominal pain and has bot had much to eat since Monday afternoon. Pt and family report concern over pt's lack of oral intake.   Pt reports she consumed about 50% of Ensure yesterday, but that's about it. RD reviewed menu options and ordered breakfast for pt. RD also provided pt with a chocolate Ensure, which she started drinking after RD visit.   Reviewed wt hx; no wt loss noted.   Discussed importance of good meal and supplement intake to promote healing. She is amenable to Ensure. RD will also liberalize diet for widest variety of choices due to very poor intake.   Per TOC notes, pt family considering SNF at discharge.   Medications reviewed and include magnesium oxide, potassium chloride, and lactated ringers infusion @ 125  ml/hr.  Labs reviewed: K: 3.2. Phos: 2.1.     NUTRITION - FOCUSED PHYSICAL EXAM:  Flowsheet Row Most Recent Value  Orbital Region No depletion  Upper Arm Region Mild depletion  Thoracic and Lumbar Region No depletion  Buccal Region No depletion  Temple Region No depletion  Clavicle Bone Region No depletion  Clavicle and Acromion Bone Region No depletion  Scapular Bone Region No depletion  Dorsal Hand No depletion  Patellar Region No depletion  Anterior Thigh Region No depletion  Posterior Calf Region No depletion  Edema (RD Assessment) None  Hair Reviewed  Eyes Reviewed  Mouth Reviewed  Skin Reviewed  Nails Reviewed       Diet Order:   Diet Order             Diet regular Fluid consistency: Thin  Diet effective now                   EDUCATION NEEDS:   Education needs have been addressed  Skin:  Skin Assessment: Reviewed RN Assessment  Last BM:  Unknown  Height:   Ht Readings from Last 1 Encounters:  08/15/22 4\' 11"  (1.499 m)    Weight:   Wt Readings from Last 1 Encounters:  08/15/22 50 kg    Ideal Body Weight:  44.7 kg  BMI:  Body mass index is 22.26 kg/m.  Estimated Nutritional Needs:   Kcal:  1250-1450  Protein:  60-75 grams  Fluid:  > 1.2 L  Levada Schilling, RD, LDN, CDCES Registered Dietitian II Certified Diabetes Care and Education Specialist Please refer to [facility] for RD and/or RD on-call/weekend/after hours pager

## 2022-08-17 NOTE — Progress Notes (Signed)
Patient is not able to walk the distance required to go the bathroom, or he/she is unable to safely negotiate stairs required to access the bathroom.  A BSC will alleviate this problem. 

## 2022-08-17 NOTE — Progress Notes (Signed)
[facility] Progress Note  Patient: Tammy Dillon    ZOX:[id number]  DOA: 08/14/2022     Date of Service: the patient was seen and examined on 08/17/2022  Chief Complaint  Patient presents with   Nausea   Weakness   Emesis   Brief hospital course: Tammy Dillon is a 89 y.o. female with medical history significant for HTN, s/p pacemaker 2021 who presents to theED with upper abdominal pain, nausea and vomiting.  She denies fever or chills, diarrhea or dysuria.  Has no chest pain or shortness of breath. ED course and data review: Vitals within normal limits.  Labs notable for WBC of 26,000, lipase and LFTs WNL.  Urinalysis with trace leukocytes and many bacteria.  Troponin 76. EKG, personally viewed and interpreted showing NSR with RBBB RUQ ultrasound showing possible acute cholecystitis The ED provider: Spoke with surgeon Dr. Maia Plan who recommended admission to the medical service for antibiotics. Hospitalist consulted in the a.m.    Assessment and Plan: # Acute cholecystitis Much improved, now s/p cholecystostomy drain placed by IR on 5/29. Gen surg following - transition to augmentin today, plan for 10 day course - gen surg f/u 2 weeks, dr. Maia Plan says they will coordinate drain removal - will need daily emptying of the drain and daily flushing with 5 cc saline, family to come by tomorrow morning for that training  # Debility PT advising home health. Family concerned patient not physically ready to come home. PT re-evaluated today and their recommendation remains the same, noted she was able to get out of bed by herself and ambulate without assistance. Family unable to take patient home today but have agreed to discharge tomorrow - hh pt/ot at discharge - bedside commode and rolling walker ordered   # Elevated troponin Troponin 76 suspect demand ischemia from acute illness   Essential hypertension Held telmisartan and hydrochlorothiazide for now due to soft blood  pressure Use hydralazine as needed Monitor BP and titrate medications accordingly  History of cardiac pacemaker No acute issues suspected   Body mass index is 22.26 kg/m.  Interventions:       Diet: Heart healthy diet DVT Prophylaxis: Subcutaneous Lovenox   Advance goals of care discussion: Full code  Family Communication: son updated @ bedside   Disposition:  Home with home health tomorrow  Subjective: reports pain resolved, tolerating diet, no nausea/vomiting, had bm this morning  Physical Exam: General: NAD, lying comfortably Appear in no distress, affect appropriate Eyes: PERRLA ENT: Oral Mucosa Clear, moist  Neck: no JVD,  Cardiovascular: S1 and S2 Present, no Murmur,  Respiratory: good respiratory effort, Bilateral Air entry equal and Decreased, no Crackles, no wheezes Abdomen: Bowel Sound present, Soft and RUQ tenderness, s/p cholecystostomy tube intact with bile draining in the bag Skin: no rashes Extremities: no Pedal edema, no calf tenderness Neurologic: without any new focal findings  Vitals:   08/16/22 1600 08/16/22 1951 08/17/22 0446 08/17/22 0745  BP: (!) 114/51 97/65 (!) 114/50 (!) 129/58  Pulse: 84 96 76 80  Resp: 18 18 19 18   Temp: 98 F (36.7 C) 98.4 F (36.9 C) 97.6 F (36.4 C) 98 F (36.7 C)  TempSrc:  Oral  Oral  SpO2: 94% 97% 94% 93%  Weight:      Height:        Intake/Output Summary (Last 24 hours) at 08/17/2022 1441 Last data filed at 08/17/2022 0602 Gross per 24 hour  Intake 1639.41 ml  Output 95 ml  Net 1544.41  ml   Filed Weights   08/14/22 2116 08/15/22 0945  Weight: 50 kg 50 kg    Data Reviewed: I have personally reviewed and interpreted daily labs, tele strips, imagings as discussed above. I reviewed all nursing notes, pharmacy notes, vitals, pertinent old records I have discussed plan of care as described above with RN and patient/family.  CBC: Recent Labs  Lab 08/14/22 2118 08/15/22 1251 08/16/22 0400  08/17/22 0518  WBC 26.1* 20.4* 17.5* 11.1*  HGB 11.7* 12.8 10.3* 9.3*  HCT 34.8* 38.3 30.8* 27.1*  MCV 92.3 92.5 93.3 91.2  PLT 218 204 166 155   Basic Metabolic Panel: Recent Labs  Lab 08/14/22 2325 08/15/22 1251 08/16/22 0400 08/17/22 0518  NA 133* 135 136 135  K 4.0 3.5 3.7 3.2*  CL 99 98 99 101  CO2 25 26 27 27   GLUCOSE 158* 128* 107* 88  BUN 22 23 28* 26*  CREATININE 0.84 0.98 1.06* 0.90  CALCIUM 9.3 9.0 8.4* 8.5*  MG  --  1.7 1.7 1.9  PHOS  --  3.1 3.0 2.1*    Studies: No results found.  Scheduled Meds:  donepezil  5 mg Oral Daily   enoxaparin (LOVENOX) injection  30 mg Subcutaneous Q24H   feeding supplement  237 mL Oral BID BM   lidocaine  10 mL Intradermal Once   magnesium oxide  400 mg Oral BID   multivitamin with minerals  1 tablet Oral Daily   sodium chloride flush  5 mL Intracatheter Q8H   Continuous Infusions:  lactated ringers 125 mL/hr at 08/17/22 1211   piperacillin-tazobactam (ZOSYN)  IV 3.375 g (08/17/22 1347)   PRN Meds: acetaminophen **OR** acetaminophen, hydrALAZINE, HYDROcodone-acetaminophen, iopamidol, morphine injection, ondansetron **OR** ondansetron (ZOFRAN) IV  Time spent: 35 minutes  Author: Shonna Chock, MD [facility] 08/17/2022 2:41 PM  To reach On-call, see care teams to locate the attending and reach out to them via [email or web address]. If 7PM-7AM, please contact night-coverage If you still have difficulty reaching the attending provider, please page the [facility] (Director on Call) for [facility] on amion for assistance.

## 2022-08-17 NOTE — Progress Notes (Signed)
Patient ID: Tammy Dillon, female   DOB: 02-05-1930, 89 y.o.   MRN: [id number]      SURGICAL PROGRESS NOTE   Hospital Day(s): 2.   Interval History: Patient seen and examined, no acute events or new complaints overnight. Patient reports feeling better today.  She endorses that she has been getting more energy.  She is tolerating small amount of feedings.  No nausea or vomiting.  No fever.  No abdominal pain.  Vital signs in last 24 hours: [min-max] current  Temp:  [97.6 F (36.4 C)-98.4 F (36.9 C)] 98 F (36.7 C) (05/31 0745) Pulse Rate:  [76-96] 80 (05/31 0745) Resp:  [18-19] 18 (05/31 0745) BP: (97-129)/(50-65) 129/58 (05/31 0745) SpO2:  [93 %-97 %] 93 % (05/31 0745)     Height: 4\' 11"  (149.9 cm) Weight: 50 kg BMI (Calculated): 22.25   Physical Exam:  Constitutional: alert, cooperative and no distress  Respiratory: breathing non-labored at rest  Cardiovascular: regular rate and sinus rhythm  Gastrointestinal: soft, non-tender, and non-distended. Drain in place  Labs:     Latest Ref Rng & Units 08/17/2022    5:18 AM 08/16/2022    4:00 AM 08/15/2022   12:51 PM  CBC  WBC 4.0 - 10.5 K/uL 11.1  17.5  20.4   Hemoglobin 12.0 - 15.0 g/dL 9.3  40.9  81.1   Hematocrit 36.0 - 46.0 % 27.1  30.8  38.3   Platelets 150 - 400 K/uL 155  166  204       Latest Ref Rng & Units 08/17/2022    5:18 AM 08/16/2022    4:00 AM 08/15/2022   12:51 PM  CMP  Glucose 70 - 99 mg/dL 88  914  782   BUN 8 - 23 mg/dL 26  28  23    Creatinine 0.44 - 1.00 mg/dL 9.56  2.13  0.86   Sodium 135 - 145 mmol/L 135  136  135   Potassium 3.5 - 5.1 mmol/L 3.2  3.7  3.5   Chloride 98 - 111 mmol/L 101  99  98   CO2 22 - 32 mmol/L 27  27  26    Calcium 8.9 - 10.3 mg/dL 8.5  8.4  9.0   Total Protein 6.5 - 8.1 g/dL 5.4  5.5    Total Bilirubin 0.3 - 1.2 mg/dL 0.5  0.7    Alkaline Phos 38 - 126 U/L 37  37    AST 15 - 41 U/L 27  27    ALT 0 - 44 U/L 18  14      Imaging studies: No new pertinent imaging  studies   Assessment/Plan:  89 y.o. female with acute cholecystitis status post percutaneous cholecystostomy, complicated by pertinent comorbidities including AV block Mobitz 2 with pacemaker, advanced age with early dementia, hypertension.   -Patient responding well to IV antibiotic therapy and percutaneous cholecystostomy -White blood cell count has decreased to 11.1 from 26 -Patient feeling better.  No abdominal pain.  Tolerating diet -From surgical standpoint patient can be discharged.  No surgical intervention is recommended during this admission. -I will follow her in 2 weeks.  Patient will need to continue with percutaneous cholecystostomy.  I will coordinate IR follow-up. -As per IR they recommended flushing the drain with 5 cc of saline daily.  Gae Gallop, MD

## 2022-08-18 ENCOUNTER — Encounter: Payer: Self-pay | Admitting: Internal Medicine

## 2022-08-18 DIAGNOSIS — K810 Acute cholecystitis: Secondary | ICD-10-CM | POA: Diagnosis not present

## 2022-08-18 LAB — AEROBIC/ANAEROBIC CULTURE W GRAM STAIN (SURGICAL/DEEP WOUND): Special Requests: NORMAL

## 2022-08-18 MED ORDER — METRONIDAZOLE 500 MG PO TABS
500.0000 mg | ORAL_TABLET | Freq: Two times a day (BID) | ORAL | Status: DC
  Administered 2022-08-18 – 2022-08-20 (×4): 500 mg via ORAL
  Filled 2022-08-18 (×4): qty 1

## 2022-08-18 MED ORDER — CIPROFLOXACIN HCL 500 MG PO TABS
500.0000 mg | ORAL_TABLET | Freq: Every day | ORAL | Status: DC
  Administered 2022-08-18: 500 mg via ORAL
  Filled 2022-08-18: qty 1

## 2022-08-18 MED ORDER — SODIUM CHLORIDE 0.9 % IN NEBU: INHALATION_SOLUTION | RESPIRATORY_TRACT | 5 refills | Status: DC

## 2022-08-18 MED ORDER — NORMAL SALINE NICU FLUSH: 5.0000 mL | INTRAVENOUS | 3 refills | Status: AC | PRN

## 2022-08-18 MED ORDER — METRONIDAZOLE 500 MG PO TABS: 500.0000 mg | ORAL_TABLET | Freq: Two times a day (BID) | ORAL | 0 refills | Status: DC

## 2022-08-18 MED ORDER — AMOXICILLIN-POT CLAVULANATE 500-125 MG PO TABS: 1.0000 | ORAL_TABLET | Freq: Two times a day (BID) | ORAL | 0 refills | Status: DC

## 2022-08-18 MED ORDER — CIPROFLOXACIN HCL 500 MG PO TABS: 500.0000 mg | ORAL_TABLET | Freq: Every day | ORAL | 0 refills | Status: DC

## 2022-08-18 MED ORDER — POTASSIUM CHLORIDE 20 MEQ PO PACK
40.0000 meq | PACK | Freq: Once | ORAL | Status: AC
  Administered 2022-08-18: 40 meq via ORAL
  Filled 2022-08-18: qty 2

## 2022-08-18 MED ORDER — HYDROCODONE-ACETAMINOPHEN 5-325 MG PO TABS: 1.0000 | ORAL_TABLET | ORAL | 0 refills | Status: DC | PRN

## 2022-08-18 NOTE — Progress Notes (Signed)
MD order received in [facility] to discharge pt home with Home Health today;  TOC previously established Home Health PT/OT/RN/SW services with Barbara Cower at [facility]; called the first contact listed in [facility]/Emergency Contacts, pt's son, Tammy Dillon [phone] to see which family member would be coming to pick the pt up for discharge today.  Tammy Dillon advised that his sister, Tammy Dillon and brother, Tammy Dillon would be coming "they will be getting educated on the drain"; Tammy Dillon also advised me that his brother, Tammy Dillon lives with the pt. Previously questioned Dr Idelle Leech about discharge orders/instructions regarding flushing the cholecystostomy tube, awaiting response and/or discharge orders from Dr Idelle Leech

## 2022-08-18 NOTE — Progress Notes (Signed)
PROGRESS NOTE    Tammy Dillon  ZOX:[id number] DOB: 03-Sep-1929 DOA: 08/14/2022 PCP: Emilio Aspen, MD   Brief Narrative:  This 89 y.o. female with medical history significant for HTN, s/p pacemaker in 2021 who presents to theED with upper abdominal pain, nausea and vomiting.  She denies fever or chills, diarrhea or dysuria.  Has no chest pain or shortness of breath. ED course Labs notable for WBC of 26,000, lipase and LFTs WNL.  Urinalysis with trace leukocytes and many bacteria.  Troponin 76., EKG, personally viewed and interpreted showing NSR with RBBB RUQ ultrasound showing possible acute cholecystitis The ED provider: Spoke with surgeon Dr. Maia Plan who recommended admission to the medical service for antibiotics.  Assessment & Plan:   Principal Problem:   Acute cholecystitis Active Problems:   Elevated troponin   History of cardiac pacemaker   Essential hypertension   # Acute cholecystitis: -S/p cholecystostomy drain placed by IR on 5/29. Gen surg following - Transitioned to augmentin today, plan for 10 day course. - Gen surg f/u 2 weeks, dr. Maia Plan says they will coordinate drain removal. - will need daily emptying of the drain and daily flushing with 5 cc saline, family educated.   # Debility/ Deconditioning: PT advising home health. Family concerned patient not physically ready to come home. PT re-evaluated  and their recommendation remains the same, noted she was able to get out of bed by herself and ambulate without assistance. Family unable to take patient home , patient doesn't have full day care, high risk for falls. -PT reevaluation requested. - bedside commode and rolling walker ordered   # Elevated troponin: Troponin 76 , Suspect demand ischemia from acute illness. No cardiovascular workup needed.   Essential hypertension: Held telmisartan and hydrochlorothiazide for now due to soft blood pressure Use hydralazine as needed. Monitor BP and titrate  medications accordingly   History of cardiac pacemaker: No acute issues suspected     Body mass index is 22.26 kg/m.  Interventions:   DVT prophylaxis: Lovenox Code Status: Full code Family Communication: Spoke with Son on phone Disposition Plan:    Status is: Inpatient Remains inpatient appropriate because: Admitted for acute cholecystitis underwent cholecystostomy tube.  PT and OT recommended home health services but patient does not have full support at home.  PT and OT is looking for SNF placement   Consultants:  General surgery, IR  Procedures: . Cholecystostomy tube  Antimicrobials:  Anti-infectives (From admission, onward)    Start     Dose/Rate Route Frequency Ordered Stop   08/18/22 0000  amoxicillin-clavulanate (AUGMENTIN) 500-125 MG tablet        1 tablet Oral 2 times daily 08/18/22 1019 08/25/22 2359   08/17/22 2200  amoxicillin-clavulanate (AUGMENTIN) 500-125 MG per tablet 1 tablet        1 tablet Oral 2 times daily 08/17/22 1449 08/27/22 2159   08/15/22 1000  piperacillin-tazobactam (ZOSYN) IVPB 3.375 g  Status:  Discontinued        3.375 g 12.5 mL/hr over 240 Minutes Intravenous Every 8 hours 08/15/22 0400 08/17/22 1449   08/15/22 0145  piperacillin-tazobactam (ZOSYN) IVPB 3.375 g        3.375 g 100 mL/hr over 30 Minutes Intravenous  Once 08/15/22 0134 08/15/22 0218      Subjective: Patient was seen and examined at bedside.  Overnight events noted.  Patient reports doing better.  Reports She still has mild abdominal pain.  She has drain in place which is draining brown-colored fluid.  Patient reports very weak and tired.  Objective: Vitals:   08/17/22 1612 08/17/22 2141 08/18/22 0434 08/18/22 0727  BP: (!) 117/46 (!) 125/53 (!) 111/51 (!) 117/57  Pulse: 78 80 81 81  Resp: 16 16 16 16   Temp: 97.7 F (36.5 C) 98.5 F (36.9 C) 98.2 F (36.8 C) 98.3 F (36.8 C)  TempSrc:  Oral  Oral  SpO2: 96% 93% 95% 95%  Weight:      Height:         Intake/Output Summary (Last 24 hours) at 08/18/2022 1219 Last data filed at 08/17/2022 1945 Gross per 24 hour  Intake 1187.7 ml  Output 105 ml  Net 1082.7 ml   Filed Weights   08/14/22 2116 08/15/22 0945  Weight: 50 kg 50 kg    Examination:  General exam: Appears calm and comfortable, deconditioned, NAD Respiratory system: Clear to auscultation. Respiratory effort normal.  RR 16 Cardiovascular system: S1 & S2 heard, RRR. No JVD, murmurs, rubs, gallops or clicks. No pedal edema. Gastrointestinal system: Abdomen is soft, mildly tender, nondistended, BS+, no drain with brown-colored fluid noted. Central nervous system: Alert and oriented. No focal neurological deficits. Extremities: No edema, no cyanosis, no clubbing Skin: No rashes, lesions or ulcers Psychiatry: Judgement and insight appear normal. Mood & affect appropriate.     Data Reviewed: I have personally reviewed following labs and imaging studies  CBC: Recent Labs  Lab 08/14/22 2118 08/15/22 1251 08/16/22 0400 08/17/22 0518  WBC 26.1* 20.4* 17.5* 11.1*  HGB 11.7* 12.8 10.3* 9.3*  HCT 34.8* 38.3 30.8* 27.1*  MCV 92.3 92.5 93.3 91.2  PLT 218 204 166 155   Basic Metabolic Panel: Recent Labs  Lab 08/14/22 2325 08/15/22 1251 08/16/22 0400 08/17/22 0518  NA 133* 135 136 135  K 4.0 3.5 3.7 3.2*  CL 99 98 99 101  CO2 25 26 27 27   GLUCOSE 158* 128* 107* 88  BUN 22 23 28* 26*  CREATININE 0.84 0.98 1.06* 0.90  CALCIUM 9.3 9.0 8.4* 8.5*  MG  --  1.7 1.7 1.9  PHOS  --  3.1 3.0 2.1*   GFR: Estimated Creatinine Clearance: 28.3 mL/min (by C-G formula based on SCr of 0.9 mg/dL). Liver Function Tests: Recent Labs  Lab 08/14/22 2325 08/16/22 0400 08/17/22 0518  AST 30 27 27   ALT 14 14 18   ALKPHOS 47 37* 37*  BILITOT 0.9 0.7 0.5  PROT 7.3 5.5* 5.4*  ALBUMIN 3.9 2.7* 2.3*   Recent Labs  Lab 08/14/22 2325  LIPASE 24   No results for input(s): "AMMONIA" in the last 168 hours. Coagulation  Profile: Recent Labs  Lab 08/15/22 1251  INR 1.3*   Cardiac Enzymes: No results for input(s): "CKTOTAL", "CKMB", "CKMBINDEX", "TROPONINI" in the last 168 hours. BNP (last 3 results) No results for input(s): "PROBNP" in the last 8760 hours. HbA1C: No results for input(s): "HGBA1C" in the last 72 hours. CBG: No results for input(s): "GLUCAP" in the last 168 hours. Lipid Profile: No results for input(s): "CHOL", "HDL", "LDLCALC", "TRIG", "CHOLHDL", "LDLDIRECT" in the last 72 hours. Thyroid Function Tests: No results for input(s): "TSH", "T4TOTAL", "FREET4", "T3FREE", "THYROIDAB" in the last 72 hours. Anemia Panel: No results for input(s): "VITAMINB12", "FOLATE", "FERRITIN", "TIBC", "IRON", "RETICCTPCT" in the last 72 hours. Sepsis Labs: No results for input(s): "PROCALCITON", "LATICACIDVEN" in the last 168 hours.  Recent Results (from the past 240 hour(s))  Aerobic/Anaerobic Culture w Gram Stain (surgical/deep wound)     Status: None (Preliminary result)  Collection Time: 08/15/22 11:26 AM   Specimen: BILE  Result Value Ref Range Status   Specimen Description   Final    BILE Performed at [facility], [address] [place], [place] [address]    Special Requests   Final    Normal Performed at [facility], [address] [place], [place] [address]    Gram Stain   Final    ABUNDANT WBC PRESENT, PREDOMINANTLY PMN NO ORGANISMS SEEN    Culture   Final    FEW KLEBSIELLA PNEUMONIAE FEW GRAM NEGATIVE RODS SUSCEPTIBILITIES TO FOLLOW HOLDING FOR POSSIBLE ANAEROBE Performed at [facility], [address] [place], [place] [address]    Report Status PENDING  Incomplete   Organism ID, Bacteria KLEBSIELLA PNEUMONIAE  Final      Susceptibility   Klebsiella pneumoniae - MIC*    AMPICILLIN >=32 RESISTANT Resistant     CEFEPIME <=0.12 SENSITIVE Sensitive     CEFTAZIDIME <=1 SENSITIVE Sensitive     CEFTRIAXONE <=0.25 SENSITIVE Sensitive      CIPROFLOXACIN <=0.25 SENSITIVE Sensitive     GENTAMICIN <=1 SENSITIVE Sensitive     IMIPENEM 0.5 SENSITIVE Sensitive     TRIMETH/SULFA <=20 SENSITIVE Sensitive     AMPICILLIN/SULBACTAM 4 SENSITIVE Sensitive     PIP/TAZO <=4 SENSITIVE Sensitive     * FEW KLEBSIELLA PNEUMONIAE    Radiology Studies: No results found.  Scheduled Meds:  amoxicillin-clavulanate  1 tablet Oral BID   donepezil  5 mg Oral Daily   enoxaparin (LOVENOX) injection  30 mg Subcutaneous Q24H   feeding supplement  237 mL Oral BID BM   lidocaine  10 mL Intradermal Once   magnesium oxide  400 mg Oral BID   multivitamin with minerals  1 tablet Oral Daily   sodium chloride flush  5 mL Intracatheter Q8H   Continuous Infusions:   LOS: 3 days    Time spent: 50 mins    Willeen Niece, MD [facility]   If 7PM-7AM, please contact night-coverage

## 2022-08-18 NOTE — Discharge Instructions (Signed)
Advised to follow-up with primary care physician in 1 week. Advised to follow-up with general surgery in 2 weeks. Patient is being discharged with cholecystostomy drain. Advised to take Augmentin twice a day for 7 days to complete total 10-day treatment.

## 2022-08-18 NOTE — Progress Notes (Signed)
Patient ID: Tammy Dillon, female   DOB: Nov 27, 1929, 89 y.o.   MRN: [id number]     SURGICAL PROGRESS NOTE   Hospital Day(s): 3.   Interval History: Patient seen and examined, no acute events or new complaints overnight. Patient reports feeling okay.  She denies any abdominal pain.  She denies any nausea or vomiting.  Patient continue repeating that she will do what ever her son decide to do for her regarding her medical decisions.  Vital signs in last 24 hours: [min-max] current  Temp:  [97.7 F (36.5 C)-98.5 F (36.9 C)] 98.3 F (36.8 C) (06/01 0727) Pulse Rate:  [78-81] 81 (06/01 0727) Resp:  [16] 16 (06/01 0727) BP: (111-125)/(46-57) 117/57 (06/01 0727) SpO2:  [93 %-96 %] 95 % (06/01 0727)     Height: 4\' 11"  (149.9 cm) Weight: 50 kg BMI (Calculated): 22.25   Physical Exam:  Constitutional: alert, cooperative and no distress  Respiratory: breathing non-labored at rest  Cardiovascular: regular rate and sinus rhythm  Gastrointestinal: soft, non-tender, and non-distended.  Percutaneous cholecystostomy in place with bilious output.  Labs:     Latest Ref Rng & Units 08/17/2022    5:18 AM 08/16/2022    4:00 AM 08/15/2022   12:51 PM  CBC  WBC 4.0 - 10.5 K/uL 11.1  17.5  20.4   Hemoglobin 12.0 - 15.0 g/dL 9.3  53.6  64.4   Hematocrit 36.0 - 46.0 % 27.1  30.8  38.3   Platelets 150 - 400 K/uL 155  166  204       Latest Ref Rng & Units 08/17/2022    5:18 AM 08/16/2022    4:00 AM 08/15/2022   12:51 PM  CMP  Glucose 70 - 99 mg/dL 88  034  742   BUN 8 - 23 mg/dL 26  28  23    Creatinine 0.44 - 1.00 mg/dL 5.95  6.38  7.56   Sodium 135 - 145 mmol/L 135  136  135   Potassium 3.5 - 5.1 mmol/L 3.2  3.7  3.5   Chloride 98 - 111 mmol/L 101  99  98   CO2 22 - 32 mmol/L 27  27  26    Calcium 8.9 - 10.3 mg/dL 8.5  8.4  9.0   Total Protein 6.5 - 8.1 g/dL 5.4  5.5    Total Bilirubin 0.3 - 1.2 mg/dL 0.5  0.7    Alkaline Phos 38 - 126 U/L 37  37    AST 15 - 41 U/L 27  27    ALT 0 - 44 U/L 18  14       Imaging studies: No new pertinent imaging studies   Assessment/Plan:  89 y.o. female with acute cholecystitis status post percutaneous cholecystostomy, complicated by pertinent comorbidities including AV block Mobitz 2 with pacemaker, advanced age with early dementia, hypertension.   -Patient without any clinical deterioration.  Adequate vital signs.  No fever -Patient continue tolerating diet.  She responded well to IV antibiotic therapy and percutaneous cholecystostomy -No contraindication to transfusion to oral antibiotic therapy.  Recommendation to complete 10 days of antibiotic therapy. -She can be discharged home or skilled nursing facility as per final disposition by hospitalist and family conversations -I will continue to follow her as outpatient -Will live with percutaneous cholecystostomy in place.  She will have follow-up with surgery and IR   Gae Gallop, MD

## 2022-08-18 NOTE — TOC Transition Note (Addendum)
Transition of Care [facility]) - CM/SW Discharge Note   Patient Details  Name: ANAIZA AZBILL MRN: [id number] Date of Birth: Mar 17, 1930  [facility]) CM/SW Contact:  Allena Katz, LCSW Phone Number: 08/18/2022, 10:23 AM   Clinical Narrative:    Discharge now canceled- family would like rehab  Final next level of care: Home w Home Health Services Barriers to Discharge: Barriers Resolved   Patient Goals and CMS Choice CMS Medicare.gov Compare Post Acute Care list provided to:: Patient Represenative (must comment) [name]) Choice offered to / list presented to : Patient  Discharge Placement                      Patient and family notified of of transfer: 08/18/22  Discharge Plan and Services Additional resources added to the After Visit Summary for                    DME Agency: [employer] Date DME Agency Contacted: 08/13/22   Representative spoke with at DME Agency: Barbara Cower HH Arranged: RN, PT, OT [facility] Agency: [facility] Date HH Agency Contacted: 08/15/22 Time HH Agency Contacted: 1100 Representative spoke with at [facility] Agency: Barbara Cower  Social Determinants of Health (SDOH) Interventions SDOH Screenings   Food Insecurity: No Food Insecurity (08/15/2022)  Housing: Low Risk  (08/15/2022)  Transportation Needs: No Transportation Needs (08/15/2022)  Utilities: Not At Risk (08/15/2022)     Readmission Risk Interventions     No data to display

## 2022-08-18 NOTE — TOC Progression Note (Signed)
Transition of Care [facility]) - Progression Note    Patient Details  Name: Tammy Dillon MRN: [id number] Date of Birth: [birth year]  Transition of Care [facility]) CM/SW Contact  Allena Katz, LCSW Phone Number: 08/18/2022, 12:28 PM  Clinical Narrative:    Family now wanting rehab at [facility] family member is a Actor and is apart of that community. Second choice is [facility].  Expected Discharge Plan: Home w Home Health Services Barriers to Discharge: Barriers Resolved  Expected Discharge Plan and Services         Expected Discharge Date: 08/18/22                 DME Agency: [facility] Date DME Agency Contacted: 08/13/22   Representative spoke with at DME Agency: Barbara Cower HH Arranged: RN, PT, OT [facility] Agency: [facility] Date HH Agency Contacted: 08/15/22 Time HH Agency Contacted: 1100 Representative spoke with at [facility] Agency: Barbara Cower   Social Determinants of Health (SDOH) Interventions SDOH Screenings   Food Insecurity: No Food Insecurity (08/15/2022)  Housing: Low Risk  (08/15/2022)  Transportation Needs: No Transportation Needs (08/15/2022)  Utilities: Not At Risk (08/15/2022)    Readmission Risk Interventions     No data to display

## 2022-08-18 NOTE — Discharge Summary (Incomplete)
Physician Discharge Summary  Tammy Dillon ZOX:[id number] DOB: [birth year] DOA: 08/14/2022  PCP: Emilio Aspen, MD  Admit date: 08/14/2022  Discharge date: 08/18/2022  Admitted From: Home Disposition:  Home  Recommendations for Outpatient Follow-up:  Follow up with PCP in 1-2 weeks Please obtain BMP/CBC in one week Advised to follow-up with general surgery in 2 weeks. Patient is being discharged with cholecystostomy drain. Advised to take Augmentin twice a day for 7 days to complete total 10-day treatment.  Home Health: [facility] PT/OT Equipment/Devices:Cholecystostomy tube  Discharge Condition: Stable CODE STATUS:Full code Diet recommendation: Heart Healthy   Brief/Interim Summary:  89 y.o. female with medical history significant for HTN, s/p pacemaker 2021 who presents to theED with upper abdominal pain, nausea and vomiting.  She denies fever or chills, diarrhea or dysuria.  Has no chest pain or shortness of breath. ED course and data review: Vitals within normal limits.  Labs notable for WBC of 26,000, lipase and LFTs WNL.  Urinalysis with trace leukocytes and many bacteria.  Troponin 76. EKG, personally viewed and interpreted showing NSR with RBBB RUQ ultrasound showing possible acute cholecystitis The ED provider: Spoke with surgeon Dr. Maia Plan who recommended admission to the medical service for antibiotics. Hospitalist consulted in the a.m.  Discharge Diagnoses:  Principal Problem:   Acute cholecystitis Active Problems:   Elevated troponin   History of cardiac pacemaker   Essential hypertension  # Acute cholecystitis Much improved, now s/p cholecystostomy drain placed by IR on 5/29. Gen surg following - transition to augmentin today, plan for 10 day course - gen surg f/u 2 weeks, dr. Maia Plan says they will coordinate drain removal - will need daily emptying of the drain and daily flushing with 5 cc saline, family to come by tomorrow morning for that training   #  Debility PT advising home health. Family concerned patient not physically ready to come home. PT re-evaluated today and their recommendation remains the same, noted she was able to get out of bed by herself and ambulate without assistance. Family unable to take patient home today but have agreed to discharge tomorrow - hh pt/ot at discharge - bedside commode and rolling walker ordered   # Elevated troponin Troponin 76 suspect demand ischemia from acute illness   Essential hypertension Held telmisartan and hydrochlorothiazide for now due to soft blood pressure Use hydralazine as needed Monitor BP and titrate medications accordingly   History of cardiac pacemaker No acute issues suspected     Body mass index is 22.26 kg/m.  Interventions:    Discharge Instructions  Discharge Instructions     Call MD for:  persistant dizziness or light-headedness   Complete by: As directed    Call MD for:  persistant nausea and vomiting   Complete by: As directed    Call MD for:  severe uncontrolled pain   Complete by: As directed    Diet - low sodium heart healthy   Complete by: As directed    Diet Carb Modified   Complete by: As directed    Discharge instructions   Complete by: As directed    Generalized weakness,  bilateral cholecystostomy drain   Increase activity slowly   Complete by: As directed    No wound care   Complete by: As directed       Allergies as of 08/18/2022       Reactions   Aldara [imiquimod] Other (See Comments)   SEVERE DISCOLORATION OF THE SKIN- STOPPED BY A MD  Other Nausea And Vomiting, Other (See Comments)   Patient requires anti-nausea medication with most ANY medication, INCLUDING anesthesia- has had this issue her whole life   Codeine Nausea And Vomiting        Medication List     STOP taking these medications    aspirin EC 81 MG tablet       TAKE these medications    amoxicillin-clavulanate 500-125 MG tablet Commonly known as:  AUGMENTIN Take 1 tablet by mouth 2 (two) times daily for 7 days.   desloratadine 5 MG tablet Commonly known as: CLARINEX Take 5 mg by mouth every evening.   donepezil 5 MG tablet Commonly known as: ARICEPT Take 5 mg by mouth daily.   HYDROcodone-acetaminophen 5-325 MG tablet Commonly known as: NORCO/VICODIN Take 1-2 tablets by mouth every 4 (four) hours as needed for up to 3 days for moderate pain.   ns flush 0.9% Soln Commonly known as: ns flush Inject 5 mLs into the vein as needed.   ondansetron 4 MG tablet Commonly known as: ZOFRAN Take 4 mg by mouth as needed for nausea or vomiting.   OVER THE COUNTER MEDICATION Take 7.5-10 mLs by mouth See admin instructions. Superior Elemental Collodial Silver - Chemical Free- Immune Support- Take 7.5-10 ml's by mouth once a day   oxybutynin 5 MG tablet Commonly known as: DITROPAN Take 2.5 mg by mouth 2 (two) times daily.   raloxifene 60 MG tablet Commonly known as: EVISTA Take 60 mg by mouth every evening.   rosuvastatin 5 MG tablet Commonly known as: CRESTOR Take 5 mg by mouth daily. Take one tablet Monday to Friday.   telmisartan-hydrochlorothiazide 80-25 MG tablet Commonly known as: MICARDIS HCT Take 0.5 tablets by mouth daily. Take 0.5 tablet Monday through Friday in the morning.   Vitamin D3 50 MCG (2000 UT) Tabs Take 2,000 Units by mouth every evening.   zinc gluconate 50 MG tablet Take 25 mg by mouth daily.               Durable Medical Equipment  (From admission, onward)           Start     Ordered   08/17/22 1440  For home use only DME Bedside commode  Once       Question:  Patient needs a bedside commode to treat with the following condition  Answer:  Cholecystitis   08/17/22 1439   08/17/22 1439  For home use only DME Walker rolling  Once       Question Answer Comment  Walker: With 5 Inch Wheels   Patient needs a walker to treat with the following condition Cholecystitis      08/17/22 1439    08/17/22 0903  For home use only DME Walker rolling  Once       Question Answer Comment  Walker: With 5 Inch Wheels   Patient needs a walker to treat with the following condition General weakness      08/17/22 0902            Follow-up Information     Oley Balm, MD Follow up in 8 week(s).   Specialties: Interventional Radiology, Radiology Why: Our staff will call patient to arrange appointment Contact information: [address] [place] [address] [phone]         Carolan Shiver, MD Follow up in 2 week(s).   Specialty: General Surgery Contact information: [address] [place] [address] [phone]  Allergies  Allergen Reactions   Aldara [Imiquimod] Other (See Comments)    SEVERE DISCOLORATION OF THE SKIN- STOPPED BY A MD   Other Nausea And Vomiting and Other (See Comments)    Patient requires anti-nausea medication with most ANY medication, INCLUDING anesthesia- has had this issue her whole life   Codeine Nausea And Vomiting    Consultations: General surgery IR   Procedures/Studies: IR Perc Cholecystostomy  Result Date: 08/15/2022 CLINICAL DATA:  Acute calculus cholecystitis EXAM: PERCUTANEOUS CHOLECYSTOSTOMY TUBE PLACEMENT WITH ULTRASOUND AND FLUOROSCOPIC GUIDANCE FLUOROSCOPY: Radiation Exposure Index (as provided by the fluoroscopic device): 1.3 mGy air Kerma TECHNIQUE: The procedure, risks (including but not limited to bleeding, infection, organ damage ), benefits, and alternatives were explained to the patient and son. Questions regarding the procedure were encouraged and answered. The son understands and consents to the procedure. Survey ultrasound of the abdomen was performed and an appropriate skin entry site was identified. Skin site was marked, prepped with Betadine, and draped in usual sterile fashion, and infiltrated locally with 1% lidocaine. Intravenous Fentanyl and Versed  0.5mg  were administered as conscious sedation during continuous monitoring of the patient's level of consciousness and physiological / cardiorespiratory status by the radiology RN, with a total moderate sedation time of 10 minutes. Under real-time ultrasound guidance, gallbladder was accessed using a transhepatic approach with a 21-gauge needle. Ultrasound image documentation was saved. Bile returned through the hub. Needle was exchanged over a 018 guidewire for transitional dilator which allowed placement of 035 J wire. Over this, a 10.2 French pigtail catheter was advanced and formed centrally in the gallbladder lumen. Small contrast injection confirmed appropriate position. Catheter secured externally with 0 Prolene suture and placed to external drain bag. Patient tolerated the procedure well. COMPLICATIONS: COMPLICATIONS none IMPRESSION: 1. Technically successful percutaneous cholecystostomy tube placement with ultrasound and fluoroscopic guidance. Electronically Signed   By: Corlis Leak M.D.   On: 08/15/2022 11:41   US ABDOMEN LIMITED RUQ (LIVER/GB)  Result Date: 08/15/2022 CLINICAL DATA:  Right upper quadrant pain, vomiting EXAM: ULTRASOUND ABDOMEN LIMITED RIGHT UPPER QUADRANT COMPARISON:  CT 02/11/2019 FINDINGS: Gallbladder: Gallstone measuring up to 1.9 cm. Gallbladder wall thickening measuring 9 mm. Trace pericholecystic fluid. Sludge within the gallbladder. Common bile duct: Diameter: Normal caliber, 4 mm. Liver: No focal lesion identified. Within normal limits in parenchymal echogenicity. Portal vein is patent on color Doppler imaging with normal direction of blood flow towards the liver. Other: None. IMPRESSION: Cholelithiasis, sludge within the gallbladder. Gallbladder wall thickening and small amount of pericholecystic fluid. Findings concerning for acute cholecystitis. Electronically Signed   By: Charlett Nose M.D.   On: 08/15/2022 01:20   CUP PACEART REMOTE DEVICE CHECK  Result Date:  08/08/2022 Scheduled remote reviewed. Normal device function.  Next remote 91 days. LA, CVRS  (Echo, Carotid, EGD, Colonoscopy, ERCP)    Subjective:   Discharge Exam: Vitals:   08/18/22 0434 08/18/22 0727  BP: (!) 111/51 (!) 117/57  Pulse: 81 81  Resp: 16 16  Temp: 98.2 F (36.8 C) 98.3 F (36.8 C)  SpO2: 95% 95%   Vitals:   08/17/22 1612 08/17/22 2141 08/18/22 0434 08/18/22 0727  BP: (!) 117/46 (!) 125/53 (!) 111/51 (!) 117/57  Pulse: 78 80 81 81  Resp: 16 16 16 16   Temp: 97.7 F (36.5 C) 98.5 F (36.9 C) 98.2 F (36.8 C) 98.3 F (36.8 C)  TempSrc:  Oral  Oral  SpO2: 96% 93% 95% 95%  Weight:  Height:        General: Pt is alert, awake, not in acute distress Cardiovascular: RRR, S1/S2 +, no rubs, no gallops Respiratory: CTA bilaterally, no wheezing, no rhonchi Abdominal: Soft, NT, ND, bowel sounds + Extremities: no edema, no cyanosis    The results of significant diagnostics from this hospitalization (including imaging, microbiology, ancillary and laboratory) are listed below for reference.     Microbiology: Recent Results (from the past 240 hour(s))  Aerobic/Anaerobic Culture w Gram Stain (surgical/deep wound)     Status: None (Preliminary result)   Collection Time: 08/15/22 11:26 AM   Specimen: BILE  Result Value Ref Range Status   Specimen Description   Final    BILE Performed at [facility], [address] [place], [place] [address]    Special Requests   Final    Normal Performed at [facility], [address] [place], [place] [address]    Gram Stain   Final    ABUNDANT WBC PRESENT, PREDOMINANTLY PMN NO ORGANISMS SEEN    Culture   Final    FEW KLEBSIELLA PNEUMONIAE FEW GRAM NEGATIVE RODS SUSCEPTIBILITIES TO FOLLOW HOLDING FOR POSSIBLE ANAEROBE Performed at [facility], [address] [place], [place] [address]    Report Status PENDING  Incomplete   Organism ID, Bacteria KLEBSIELLA PNEUMONIAE  Final       Susceptibility   Klebsiella pneumoniae - MIC*    AMPICILLIN >=32 RESISTANT Resistant     CEFEPIME <=0.12 SENSITIVE Sensitive     CEFTAZIDIME <=1 SENSITIVE Sensitive     CEFTRIAXONE <=0.25 SENSITIVE Sensitive     CIPROFLOXACIN <=0.25 SENSITIVE Sensitive     GENTAMICIN <=1 SENSITIVE Sensitive     IMIPENEM 0.5 SENSITIVE Sensitive     TRIMETH/SULFA <=20 SENSITIVE Sensitive     AMPICILLIN/SULBACTAM 4 SENSITIVE Sensitive     PIP/TAZO <=4 SENSITIVE Sensitive     * FEW KLEBSIELLA PNEUMONIAE     Labs: BNP (last 3 results) No results for input(s): "BNP" in the last 8760 hours. Basic Metabolic Panel: Recent Labs  Lab 08/14/22 2325 08/15/22 1251 08/16/22 0400 08/17/22 0518  NA 133* 135 136 135  K 4.0 3.5 3.7 3.2*  CL 99 98 99 101  CO2 25 26 27 27   GLUCOSE 158* 128* 107* 88  BUN 22 23 28* 26*  CREATININE 0.84 0.98 1.06* 0.90  CALCIUM 9.3 9.0 8.4* 8.5*  MG  --  1.7 1.7 1.9  PHOS  --  3.1 3.0 2.1*   Liver Function Tests: Recent Labs  Lab 08/14/22 2325 08/16/22 0400 08/17/22 0518  AST 30 27 27   ALT 14 14 18   ALKPHOS 47 37* 37*  BILITOT 0.9 0.7 0.5  PROT 7.3 5.5* 5.4*  ALBUMIN 3.9 2.7* 2.3*   Recent Labs  Lab 08/14/22 2325  LIPASE 24   No results for input(s): "AMMONIA" in the last 168 hours. CBC: Recent Labs  Lab 08/14/22 2118 08/15/22 1251 08/16/22 0400 08/17/22 0518  WBC 26.1* 20.4* 17.5* 11.1*  HGB 11.7* 12.8 10.3* 9.3*  HCT 34.8* 38.3 30.8* 27.1*  MCV 92.3 92.5 93.3 91.2  PLT 218 204 166 155   Cardiac Enzymes: No results for input(s): "CKTOTAL", "CKMB", "CKMBINDEX", "TROPONINI" in the last 168 hours. BNP: Invalid input(s): "POCBNP" CBG: No results for input(s): "GLUCAP" in the last 168 hours. D-Dimer No results for input(s): "DDIMER" in the last 72 hours. Hgb A1c No results for input(s): "HGBA1C" in the last 72 hours. Lipid Profile No results for  input(s): "CHOL", "HDL", "LDLCALC", "TRIG", "CHOLHDL", "LDLDIRECT" in the last 72 hours. Thyroid  function studies No results for input(s): "TSH", "T4TOTAL", "T3FREE", "THYROIDAB" in the last 72 hours.  Invalid input(s): "FREET3" Anemia work up No results for input(s): "VITAMINB12", "FOLATE", "FERRITIN", "TIBC", "IRON", "RETICCTPCT" in the last 72 hours. Urinalysis    Component Value Date/Time   COLORURINE AMBER (A) 08/14/2022 2358   APPEARANCEUR CLOUDY (A) 08/14/2022 2358   LABSPEC 1.019 08/14/2022 2358   PHURINE 5.0 08/14/2022 2358   GLUCOSEU NEGATIVE 08/14/2022 2358   HGBUR NEGATIVE 08/14/2022 2358   BILIRUBINUR NEGATIVE 08/14/2022 2358   KETONESUR 5 (A) 08/14/2022 2358   PROTEINUR 30 (A) 08/14/2022 2358   NITRITE NEGATIVE 08/14/2022 2358   LEUKOCYTESUR TRACE (A) 08/14/2022 2358   Sepsis Labs Recent Labs  Lab 08/14/22 2118 08/15/22 1251 08/16/22 0400 08/17/22 0518  WBC 26.1* 20.4* 17.5* 11.1*   Microbiology Recent Results (from the past 240 hour(s))  Aerobic/Anaerobic Culture w Gram Stain (surgical/deep wound)     Status: None (Preliminary result)   Collection Time: 08/15/22 11:26 AM   Specimen: BILE  Result Value Ref Range Status   Specimen Description   Final    BILE Performed at [facility], [address], [place], [place] [address]    Special Requests   Final    Normal Performed at [facility], [address] [place], [place] [address]    Gram Stain   Final    ABUNDANT WBC PRESENT, PREDOMINANTLY PMN NO ORGANISMS SEEN    Culture   Final    FEW KLEBSIELLA PNEUMONIAE FEW GRAM NEGATIVE RODS SUSCEPTIBILITIES TO FOLLOW HOLDING FOR POSSIBLE ANAEROBE Performed at [facility], [address], [place], [place] [address]    Report Status PENDING  Incomplete   Organism ID, Bacteria KLEBSIELLA PNEUMONIAE  Final      Susceptibility   Klebsiella pneumoniae - MIC*    AMPICILLIN >=32 RESISTANT Resistant     CEFEPIME <=0.12 SENSITIVE Sensitive     CEFTAZIDIME <=1 SENSITIVE Sensitive     CEFTRIAXONE <=0.25 SENSITIVE Sensitive      CIPROFLOXACIN <=0.25 SENSITIVE Sensitive     GENTAMICIN <=1 SENSITIVE Sensitive     IMIPENEM 0.5 SENSITIVE Sensitive     TRIMETH/SULFA <=20 SENSITIVE Sensitive     AMPICILLIN/SULBACTAM 4 SENSITIVE Sensitive     PIP/TAZO <=4 SENSITIVE Sensitive     * FEW KLEBSIELLA PNEUMONIAE     Time coordinating discharge: Over 30 minutes  SIGNED:   Willeen Niece, MD  [facility] 08/18/2022, 11:33 AM Pager   If 7PM-7AM, please contact night-coverage

## 2022-08-19 LAB — CBC
HCT: 27.6 % — ABNORMAL LOW (ref 36.0–46.0)
Hemoglobin: 9.1 g/dL — ABNORMAL LOW (ref 12.0–15.0)
MCH: 30.3 pg (ref 26.0–34.0)
MCHC: 33 g/dL (ref 30.0–36.0)
MCV: 92 fL (ref 80.0–100.0)
Platelets: 188 10*3/uL (ref 150–400)
RBC: 3 MIL/uL — ABNORMAL LOW (ref 3.87–5.11)
RDW: 13.3 % (ref 11.5–15.5)
WBC: 8.4 10*3/uL (ref 4.0–10.5)
nRBC: 0 % (ref 0.0–0.2)

## 2022-08-19 LAB — COMPREHENSIVE METABOLIC PANEL
ALT: 28 U/L (ref 0–44)
AST: 35 U/L (ref 15–41)
Albumin: 2.4 g/dL — ABNORMAL LOW (ref 3.5–5.0)
Alkaline Phosphatase: 46 U/L (ref 38–126)
Anion gap: 6 (ref 5–15)
BUN: 21 mg/dL (ref 8–23)
CO2: 26 mmol/L (ref 22–32)
Calcium: 8.5 mg/dL — ABNORMAL LOW (ref 8.9–10.3)
Chloride: 102 mmol/L (ref 98–111)
Creatinine, Ser: 0.74 mg/dL (ref 0.44–1.00)
GFR, Estimated: >60 mL/min (ref >60)
Glucose, Bld: 90 mg/dL (ref 70–99)
Potassium: 4 mmol/L (ref 3.5–5.1)
Sodium: 134 mmol/L — ABNORMAL LOW (ref 135–145)
Total Bilirubin: 0.5 mg/dL (ref 0.3–1.2)
Total Protein: 5.8 g/dL — ABNORMAL LOW (ref 6.5–8.1)

## 2022-08-19 LAB — MAGNESIUM: Magnesium: 2.1 mg/dL (ref 1.7–2.4)

## 2022-08-19 LAB — PHOSPHORUS: Phosphorus: 2 mg/dL — ABNORMAL LOW (ref 2.5–4.6)

## 2022-08-19 MED ORDER — ENOXAPARIN SODIUM 40 MG/0.4ML IJ SOSY
40.0000 mg | PREFILLED_SYRINGE | INTRAMUSCULAR | Status: DC
  Administered 2022-08-19 – 2022-08-20 (×2): 40 mg via SUBCUTANEOUS
  Filled 2022-08-19 (×2): qty 0.4

## 2022-08-19 MED ORDER — CIPROFLOXACIN HCL 500 MG PO TABS
500.0000 mg | ORAL_TABLET | Freq: Two times a day (BID) | ORAL | Status: DC
  Administered 2022-08-19 – 2022-08-20 (×3): 500 mg via ORAL
  Filled 2022-08-19 (×3): qty 1

## 2022-08-19 MED ORDER — POLYETHYLENE GLYCOL 3350 17 G PO PACK
17.0000 g | PACK | Freq: Every day | ORAL | Status: DC
  Administered 2022-08-19 – 2022-08-20 (×2): 17 g via ORAL
  Filled 2022-08-19 (×2): qty 1

## 2022-08-19 NOTE — Progress Notes (Signed)
PHARMACIST - PHYSICIAN COMMUNICATION  CONCERNING:  Enoxaparin (Lovenox) for DVT Prophylaxis   ASSESSMENT: Patient was prescribed enoxaparin 40 mg subcutaneously every 24 hours for VTE prophylaxis.   Body mass index is 22.26 kg/m.  Estimated Creatinine Clearance: 31.9 mL/min (by C-G formula based on SCr of 0.74 mg/dL).   Based on [facility] policy, patient is candidate for enoxaparin dosing of 40 mg every 24 hours based on CrCl >30 mL/min  RECOMMENDATION: Pharmacy has adjusted enoxaparin dose per [facility] policy.  DESCRIPTION: Patient is now receiving enoxaparin 40 mg subcutaneously every 24 hours.   Will M. Dareen Piano, PharmD PGY-1 Pharmacy Resident 08/19/2022 7:51 AM

## 2022-08-19 NOTE — Progress Notes (Addendum)
[facility] Progress Note  Patient: Tammy Dillon    ZOX:[id number]  DOA: 08/14/2022     Date of Service: the patient was seen and examined on 08/19/2022  Chief Complaint  Patient presents with   Nausea   Weakness   Emesis   Brief hospital course: PAULENA KIANG is a 89 y.o. female with medical history significant for HTN, s/p pacemaker 2021 who presents to theED with upper abdominal pain, nausea and vomiting.  She denies fever or chills, diarrhea or dysuria.  Has no chest pain or shortness of breath. ED course and data review: Vitals within normal limits.  Labs notable for WBC of 26,000, lipase and LFTs WNL.  Urinalysis with trace leukocytes and many bacteria.  Troponin 76. EKG, personally viewed and interpreted showing NSR with RBBB RUQ ultrasound showing possible acute cholecystitis The ED provider: Spoke with surgeon Dr. Maia Plan who recommended admission to the medical service for antibiotics.   Assessment and Plan: # Acute cholecystitis Much improved, now s/p cholecystostomy drain placed by IR on 5/29. Aspirate growing klebsiella and e coli - augmentin transitioned to cipro/flgayl based on susceptibilities, plan for 10 day course - gen surg f/u 2 weeks, dr. Maia Plan says they will coordinate drain removal - will need daily emptying of the drain and daily flushing with 5 cc saline  # Debility PT advising home health. Family concerned patient not physically ready to come home. PT re-evaluated 5/31 and their recommendation remained the same. Family still requesting SNF and TOC has started that process, unclear whether she will qualify. PT has evaluated patient again today, recs pending.   # Elevated troponin Troponin 70s suspect demand ischemia from acute illness, no chest pain or EKG changes, no further w/u pursued   Essential hypertension Normotensive off home bp meds  History of cardiac pacemaker No acute issues suspected   Body mass index is 22.26 kg/m.   Interventions:       Diet: Heart healthy diet DVT Prophylaxis: Subcutaneous Lovenox   Advance goals of care discussion: Full code  Family Communication: son updated @ bedside 6/2   Disposition:  Home with home health tomorrow  Subjective: feeling well, no complaints  Physical Exam: General: NAD, lying comfortably Appear in no distress, affect appropriate Eyes: PERRLA ENT: Oral Mucosa Clear, moist  Neck: no JVD,  Cardiovascular: S1 and S2 Present, no Murmur,  Respiratory: good respiratory effort, Bilateral Air entry equal and Decreased, no Crackles, no wheezes Abdomen: Bowel Sound present, Soft and RUQ tenderness, s/p cholecystostomy tube intact with bile draining in the bag Skin: no rashes Extremities: no Pedal edema, no calf tenderness Neurologic: without any new focal findings  Vitals:   08/18/22 1612 08/18/22 2054 08/19/22 0426 08/19/22 0740  BP: 128/60 (!) 128/52 (!) 136/52 (!) 143/65  Pulse: 81 78 75 79  Resp: 18 16 12 17   Temp: 97.9 F (36.6 C) 98.2 F (36.8 C) 98.5 F (36.9 C) 99.3 F (37.4 C)  TempSrc:  Oral Oral   SpO2: 95% 94% 94% 94%  Weight:      Height:        Intake/Output Summary (Last 24 hours) at 08/19/2022 1258 Last data filed at 08/19/2022 8119 Gross per 24 hour  Intake 250 ml  Output 135 ml  Net 115 ml   Filed Weights   08/14/22 2116 08/15/22 0945  Weight: 50 kg 50 kg    Data Reviewed: I have personally reviewed and interpreted daily labs, tele strips, imagings as discussed above. I reviewed  all nursing notes, pharmacy notes, vitals, pertinent old records I have discussed plan of care as described above with RN and patient/family.  CBC: Recent Labs  Lab 08/14/22 2118 08/15/22 1251 08/16/22 0400 08/17/22 0518 08/19/22 0506  WBC 26.1* 20.4* 17.5* 11.1* 8.4  HGB 11.7* 12.8 10.3* 9.3* 9.1*  HCT 34.8* 38.3 30.8* 27.1* 27.6*  MCV 92.3 92.5 93.3 91.2 92.0  PLT 218 204 166 155 188   Basic Metabolic Panel: Recent Labs  Lab  08/14/22 2325 08/15/22 1251 08/16/22 0400 08/17/22 0518 08/19/22 0506  NA 133* 135 136 135 134*  K 4.0 3.5 3.7 3.2* 4.0  CL 99 98 99 101 102  CO2 25 26 27 27 26   GLUCOSE 158* 128* 107* 88 90  BUN 22 23 28* 26* 21  CREATININE 0.84 0.98 1.06* 0.90 0.74  CALCIUM 9.3 9.0 8.4* 8.5* 8.5*  MG  --  1.7 1.7 1.9 2.1  PHOS  --  3.1 3.0 2.1* 2.0*    Studies: No results found.  Scheduled Meds:  ciprofloxacin  500 mg Oral BID   donepezil  5 mg Oral Daily   enoxaparin (LOVENOX) injection  40 mg Subcutaneous Q24H   feeding supplement  237 mL Oral BID BM   lidocaine  10 mL Intradermal Once   magnesium oxide  400 mg Oral BID   metroNIDAZOLE  500 mg Oral Q12H   multivitamin with minerals  1 tablet Oral Daily   sodium chloride flush  5 mL Intracatheter Q8H   Continuous Infusions:   PRN Meds: acetaminophen **OR** acetaminophen, hydrALAZINE, HYDROcodone-acetaminophen, iopamidol, morphine injection, ondansetron **OR** ondansetron (ZOFRAN) IV  Time spent: 35 minutes  Author: Shonna Chock, MD [facility] 08/19/2022 12:58 PM  To reach On-call, see care teams to locate the attending and reach out to them via [email or web address]. If 7PM-7AM, please contact night-coverage If you still have difficulty reaching the attending provider, please page the [facility] (Director on Call) for [facility] on amion for assistance.

## 2022-08-19 NOTE — TOC Progression Note (Signed)
Transition of Care [facility]) - Progression Note    Patient Details  Name: Tammy Dillon MRN: [id number] Date of Birth: 1929/04/01  Transition of [facility]) CM/SW Contact  Bing Quarry, RN Phone Number: 08/19/2022, 12:05 PM  Clinical Narrative: 6/2: Sherron Monday with daughter, Gilda Crease, at [phone], regarding SNF referrals and she indicated [facility] is not a STR SNF so first preference is back to [facility]. She spoke to a Deirdre Peer at [phone] who was going to speak to the [facility] regarding this patient as their is a 30 year long term history with the on call provider, Dr. Kevan Ny. She feels they will have a bed Monday, 08/20/22. They will accept [facility] if this does not work out. Patton Salles  at [phone], notified at [facility] and will notify admissions team for possible Monday admission if first preference is declined.  Updated daughter on contact attempts. Gabriel Cirri RN CM (Weekends only) [phone].    Expected Discharge Plan: Home w Home Health Services Barriers to Discharge: Barriers Resolved  Expected Discharge Plan and Services         Expected Discharge Date: 08/18/22                 DME Agency: [employer] Date DME Agency Contacted: 08/13/22   Representative spoke with at DME Agency: Mickael Mcnutt Cower HH Arranged: RN, PT, OT [facility] Agency: [facility] Date HH Agency Contacted: 08/15/22 Time HH Agency Contacted: 1100 Representative spoke with at [facility] Agency: Cree Napoli Cower   Social Determinants of Health (SDOH) Interventions SDOH Screenings   Food Insecurity: No Food Insecurity (08/15/2022)  Housing: Low Risk  (08/15/2022)  Transportation Needs: No Transportation Needs (08/15/2022)  Utilities: Not At Risk (08/15/2022)  Tobacco Use: Low Risk  (08/18/2022)    Readmission Risk Interventions     No data to display

## 2022-08-19 NOTE — Plan of Care (Signed)
  Problem: Education: Goal: Knowledge of General Education information will improve Description: Including pain rating scale, medication(s)/side effects and non-pharmacologic comfort measures Outcome: Progressing   Problem: Clinical Measurements: Goal: Ability to maintain clinical measurements within normal limits will improve Outcome: Progressing Goal: Respiratory complications will improve Outcome: Progressing   

## 2022-08-19 NOTE — Progress Notes (Signed)
Physical Therapy Treatment Patient Details Name: Tammy Dillon MRN: [id number] DOB: [birth year] Today's Date: 08/19/2022   History of Present Illness Pt is a 89 yo female that presented to ED for nausea, vomiting, abdominal pain. Percutaneous cholecystomy placed. PMH of dementia, pacemaker, HTN.    PT Comments    Patient is agreeable to PT session. Supportive son present throughout session. The patient is requiring increased physical assistance with basic mobility tasks compared to previous session. She required moderate assistance for bed mobility and for standing from bed and from toilet. She can walk a short distance using rolling walker with activity tolerance limited by fatigue. She required physical assistance for lower body dressing and assistance for peri-care after bowel movement. Anticipate the patient will now require frequent supervision/assistance at discharge and discharge recommendation has been updated accordingly. Discussed this with the son. Recommend to continue PT to maximize independence and to decrease caregiver burden.    Recommendations for follow up therapy are one component of a multi-disciplinary discharge planning process, led by the attending physician.  Recommendations may be updated based on patient status, additional functional criteria and insurance authorization.  Follow Up Recommendations  Can patient physically be transported by private vehicle: Yes    Assistance Recommended at Discharge Frequent or constant Supervision/Assistance  Patient can return home with the following A little help with walking and/or transfers;A little help with bathing/dressing/bathroom;Assistance with cooking/housework;Assist for transportation;Help with stairs or ramp for entrance;Direct supervision/assist for medications management   Equipment Recommendations  Rolling walker (2 wheels);BSC/3in1    Recommendations for Other Services       Precautions / Restrictions  Precautions Precautions: Fall;ICD/Pacemaker Precaution Comments: cholecystomy Restrictions Weight Bearing Restrictions: No     Mobility  Bed Mobility Overal bed mobility: Needs Assistance Bed Mobility: Supine to Sit, Sit to Supine     Supine to sit: Mod assist Sit to supine: Mod assist   General bed mobility comments: assistance required for trunk support to sit upright. assistance required for BLE to return to bed. increased time and effort required. cues for sequencing    Transfers Overall transfer level: Needs assistance Equipment used: Rolling walker (2 wheels) Transfers: Sit to/from Stand Sit to Stand: Mod assist           General transfer comment: lifting and lowering assistance provided with transfers.    Ambulation/Gait Ambulation/Gait assistance: Min guard Gait Distance (Feet): 20 Feet Assistive device: Rolling walker (2 wheels) Gait Pattern/deviations: Step-to pattern, Narrow base of support     Pre-gait activities: cues for upright standing posture and anterior weight shifting as patient is flexed in standing and relying on bed for posterior leg support initially with standing. General Gait Details: Min guard provided for safety for ambulation from bed to toilet and back to bed. activity tolerance is limited by fatigue and patient requesting not to ambulate further at this time   Stairs             Wheelchair Mobility    Modified Rankin (Stroke Patients Only)       Balance Overall balance assessment: Needs assistance Sitting-balance support: Feet supported, Bilateral upper extremity supported Sitting balance-Leahy Scale: Fair     Standing balance support: During functional activity, Reliant on assistive device for balance Standing balance-Leahy Scale: Poor Standing balance comment: external support required initially.                            Cognition Arousal/Alertness: Awake/alert Behavior During  Therapy: WFL for tasks  assessed/performed, Flat affect Overall Cognitive Status: History of cognitive impairments - at baseline                                 General Comments: Patient is able to follow all commands without difficulty. Cues for sequencing provided with activity        Exercises      General Comments General comments (skin integrity, edema, etc.): bed sheets and gown wet with urine with bowel residue in mesh underwear. clean sheets, gown, and mesh underwear provided. patient required assistance for donning underware and for hygiene after bowel movement on toilet      Pertinent Vitals/Pain Pain Assessment Pain Assessment: Faces Faces Pain Scale: Hurts a little bit Pain Location: R upper quadrant Pain Descriptors / Indicators: Sore, Discomfort Pain Intervention(s): Limited activity within patient's tolerance, Monitored during session, Repositioned    Home Living                          Prior Function            PT Goals (current goals can now be found in the care plan section) Acute Rehab PT Goals Patient Stated Goal: to feel better PT Goal Formulation: With family Time For Goal Achievement: 08/29/22 Potential to Achieve Goals: Fair Progress towards PT goals: Progressing toward goals    Frequency    Min 3X/week      PT Plan Discharge plan needs to be updated    Co-evaluation              AM-PAC PT "6 Clicks" Mobility   Outcome Measure  Help needed turning from your back to your side while in a flat bed without using bedrails?: A Little Help needed moving from lying on your back to sitting on the side of a flat bed without using bedrails?: A Lot Help needed moving to and from a bed to a chair (including a wheelchair)?: A Lot Help needed standing up from a chair using your arms (e.g., wheelchair or bedside chair)?: A Lot Help needed to walk in hospital room?: A Little Help needed climbing 3-5 steps with a railing? : A Lot 6 Click Score:  14    End of Session Equipment Utilized During Treatment: Gait belt Activity Tolerance: Patient limited by fatigue Patient left: in bed;with call bell/phone within reach;with bed alarm set Nurse Communication: Mobility status PT Visit Diagnosis: Other abnormalities of gait and mobility (R26.89);Muscle weakness (generalized) (M62.81)     Time: 1610-9604 PT Time Calculation (min) (ACUTE ONLY): 31 min  Charges:  $Therapeutic Activity: 23-37 mins                     Donna Bernard, PT, MPT    Ina Homes 08/19/2022, 2:14 PM

## 2022-08-19 NOTE — Progress Notes (Signed)
PHARMACY NOTE:  ANTIMICROBIAL RENAL DOSAGE ADJUSTMENT  Current antimicrobial regimen includes a mismatch between antimicrobial dosage and estimated renal function.  As per policy approved by the Pharmacy & Therapeutics and Medical Executive Committees, the antimicrobial dosage will be adjusted accordingly.  Current antimicrobial dosage:  Ciprofloxacin 500 mg PO q24H  Indication: Cholecystitis  Renal Function:  Estimated Creatinine Clearance: 31.9 mL/min (by C-G formula based on SCr of 0.74 mg/dL).    Antimicrobial dosage has been changed to:  Ciprofloxacin 500 mg PO q12H  Additional comments:   Thank you for allowing pharmacy to be a part of this patient's care.  Will M. Dareen Piano, PharmD PGY-1 Pharmacy Resident 08/19/2022 7:50 AM

## 2022-08-20 DIAGNOSIS — T7840XD Allergy, unspecified, subsequent encounter: Secondary | ICD-10-CM | POA: Diagnosis not present

## 2022-08-20 DIAGNOSIS — R2681 Unsteadiness on feet: Secondary | ICD-10-CM | POA: Diagnosis not present

## 2022-08-20 DIAGNOSIS — M6281 Muscle weakness (generalized): Secondary | ICD-10-CM | POA: Diagnosis not present

## 2022-08-20 DIAGNOSIS — M810 Age-related osteoporosis without current pathological fracture: Secondary | ICD-10-CM | POA: Diagnosis not present

## 2022-08-20 DIAGNOSIS — E559 Vitamin D deficiency, unspecified: Secondary | ICD-10-CM | POA: Diagnosis not present

## 2022-08-20 DIAGNOSIS — R7989 Other specified abnormal findings of blood chemistry: Secondary | ICD-10-CM | POA: Diagnosis not present

## 2022-08-20 DIAGNOSIS — R278 Other lack of coordination: Secondary | ICD-10-CM | POA: Diagnosis not present

## 2022-08-20 DIAGNOSIS — R112 Nausea with vomiting, unspecified: Secondary | ICD-10-CM | POA: Diagnosis not present

## 2022-08-20 DIAGNOSIS — Z4889 Encounter for other specified surgical aftercare: Secondary | ICD-10-CM | POA: Diagnosis not present

## 2022-08-20 DIAGNOSIS — Z7401 Bed confinement status: Secondary | ICD-10-CM | POA: Diagnosis not present

## 2022-08-20 DIAGNOSIS — K810 Acute cholecystitis: Secondary | ICD-10-CM | POA: Diagnosis not present

## 2022-08-20 DIAGNOSIS — F0390 Unspecified dementia without behavioral disturbance: Secondary | ICD-10-CM | POA: Diagnosis not present

## 2022-08-20 DIAGNOSIS — R52 Pain, unspecified: Secondary | ICD-10-CM | POA: Diagnosis not present

## 2022-08-20 DIAGNOSIS — Z950 Presence of cardiac pacemaker: Secondary | ICD-10-CM | POA: Diagnosis not present

## 2022-08-20 DIAGNOSIS — K8010 Calculus of gallbladder with chronic cholecystitis without obstruction: Secondary | ICD-10-CM | POA: Diagnosis not present

## 2022-08-20 DIAGNOSIS — Z434 Encounter for attention to other artificial openings of digestive tract: Secondary | ICD-10-CM | POA: Diagnosis not present

## 2022-08-20 DIAGNOSIS — I10 Essential (primary) hypertension: Secondary | ICD-10-CM | POA: Diagnosis not present

## 2022-08-20 MED ORDER — CIPROFLOXACIN HCL 500 MG PO TABS: 500.0000 mg | ORAL_TABLET | Freq: Every day | ORAL | 0 refills | Status: AC

## 2022-08-20 MED ORDER — METRONIDAZOLE 500 MG PO TABS: 500.0000 mg | ORAL_TABLET | Freq: Two times a day (BID) | ORAL | 0 refills | Status: AC

## 2022-08-20 NOTE — Care Management Important Message (Signed)
Important Message  Patient Details  Name: Tammy Dillon MRN: [id number] Date of Birth: 1929/04/22   Medicare Important Message Given:  Yes     Olegario Messier A [name] 08/20/2022, 1:25 PM

## 2022-08-20 NOTE — TOC Progression Note (Addendum)
Transition of Care [facility]) - Progression Note    Patient Details  Name: AVENELL FOOSE MRN: [id number] Date of Birth: 04/26/1929  [facility]) CM/SW Contact  Margarito Liner, LCSW Phone Number: 08/20/2022, 11:26 AM  Clinical Narrative: [facility] listed as considering on hub. CSW left message for admissions coordinator to see if there are any updates regarding bed offer.  11:50 am: [facility] can offer a bed today if stable. Sent secure chat to MD to notify.  Expected Discharge Plan: Home w Home Health Services Barriers to Discharge: Barriers Resolved  Expected Discharge Plan and Services         Expected Discharge Date: 08/18/22                 DME Agency: [employer] Date DME Agency Contacted: 08/13/22   Representative spoke with at DME Agency: Barbara Cower HH Arranged: RN, PT, OT [facility] Agency: [facility] Date HH Agency Contacted: 08/15/22 Time HH Agency Contacted: 1100 Representative spoke with at [facility] Agency: Barbara Cower   Social Determinants of Health (SDOH) Interventions SDOH Screenings   Food Insecurity: No Food Insecurity (08/15/2022)  Housing: Low Risk  (08/15/2022)  Transportation Needs: No Transportation Needs (08/15/2022)  Utilities: Not At Risk (08/15/2022)  Tobacco Use: Low Risk  (08/18/2022)    Readmission Risk Interventions     No data to display

## 2022-08-20 NOTE — TOC Transition Note (Signed)
Transition of Care [facility]) - CM/SW Discharge Note   Patient Details  Name: Tammy Dillon MRN: [id number] Date of Birth: April 28, 1929  Transition of Care [facility]) CM/SW Contact:  Margarito Liner, LCSW Phone Number: 08/20/2022, 1:37 PM   Clinical Narrative:  Patient has orders to discharge to [facility] today. RN will call report to [phone] (Room 701B). EMS transport has been arranged and she is next on the list. No further concerns. CSW signing off.   Final next level of care: Skilled Nursing Facility Barriers to Discharge: Barriers Resolved   Patient Goals and CMS Choice CMS Medicare.gov Compare Post Acute Care list provided to:: Patient Represenative (must comment) [name]) Choice offered to / list presented to : Adult Children  Discharge Placement PASRR number recieved: 08/17/22 PASRR number recieved: 08/17/22            Patient chooses bed at: [facility] Patient to be transferred to facility by: EMS Name of family member notified: Gwynneth Macleod and Dalene Carrow Patient and family notified of of transfer: 08/20/22  Discharge Plan and Services Additional resources added to the After Visit Summary for                    DME Agency: [employer] Date DME Agency Contacted: 08/13/22   Representative spoke with at DME Agency: Barbara Cower HH Arranged: RN, PT, OT [facility] Agency: [facility] Date HH Agency Contacted: 08/15/22 Time HH Agency Contacted: 1100 Representative spoke with at [facility] Agency: Barbara Cower  Social Determinants of Health (SDOH) Interventions SDOH Screenings   Food Insecurity: No Food Insecurity (08/15/2022)  Housing: Low Risk  (08/15/2022)  Transportation Needs: No Transportation Needs (08/15/2022)  Utilities: Not At Risk (08/15/2022)  Tobacco Use: Low Risk  (08/18/2022)     Readmission Risk Interventions     No data to display

## 2022-08-22 DIAGNOSIS — I10 Essential (primary) hypertension: Secondary | ICD-10-CM | POA: Diagnosis not present

## 2022-08-22 DIAGNOSIS — K810 Acute cholecystitis: Secondary | ICD-10-CM | POA: Diagnosis not present

## 2022-08-22 DIAGNOSIS — Z950 Presence of cardiac pacemaker: Secondary | ICD-10-CM | POA: Diagnosis not present

## 2022-08-22 DIAGNOSIS — F0390 Unspecified dementia without behavioral disturbance: Secondary | ICD-10-CM | POA: Diagnosis not present

## 2022-08-28 DIAGNOSIS — K8010 Calculus of gallbladder with chronic cholecystitis without obstruction: Secondary | ICD-10-CM | POA: Diagnosis not present

## 2022-08-30 NOTE — Progress Notes (Signed)
Remote pacemaker transmission.   

## 2022-08-31 ENCOUNTER — Other Ambulatory Visit: Payer: Self-pay | Admitting: *Deleted

## 2022-08-31 NOTE — Patient Outreach (Signed)
Mrs. Siebert resides in [facility] skilled nursing facility. Screening for potential [facility] care coordination services as benefit of health plan and Primary Care Provider.   Collaboration with [facility] social worker. Family meeting scheduled for today. Anticipated transition plan is to return home with son.   Will continue to follow.   Raiford Noble, MSN, RN,BSN [facility] Post Acute Care Coordinator [phone] (Direct dial)

## 2022-09-08 DIAGNOSIS — Z950 Presence of cardiac pacemaker: Secondary | ICD-10-CM | POA: Diagnosis not present

## 2022-09-08 DIAGNOSIS — I10 Essential (primary) hypertension: Secondary | ICD-10-CM | POA: Diagnosis not present

## 2022-09-08 DIAGNOSIS — F0390 Unspecified dementia without behavioral disturbance: Secondary | ICD-10-CM | POA: Diagnosis not present

## 2022-09-08 DIAGNOSIS — K810 Acute cholecystitis: Secondary | ICD-10-CM | POA: Diagnosis not present

## 2022-09-10 ENCOUNTER — Other Ambulatory Visit: Payer: Self-pay | Admitting: *Deleted

## 2022-09-10 NOTE — Patient Outreach (Addendum)
Per [facility] Mrs. Boxley discharged from [facility] skilled nursing facility on 09/08/22 with home health. Screening for potential care coordination services as benefit of health plan and Primary Care Provider.  Secure communication sent to Jill Side to confirm home health agency arrangements.   Addendum: [facility] was arranged for PT/OT/N/HHA. PCP with [facility] [place].  Raiford Noble, MSN, RN,BSN [facility] Post Acute Care Coordinator [phone] (Direct dial)

## 2022-09-11 DIAGNOSIS — I10 Essential (primary) hypertension: Secondary | ICD-10-CM | POA: Diagnosis not present

## 2022-09-11 DIAGNOSIS — Z79891 Long term (current) use of opiate analgesic: Secondary | ICD-10-CM | POA: Diagnosis not present

## 2022-09-11 DIAGNOSIS — B9620 Unspecified Escherichia coli [E. coli] as the cause of diseases classified elsewhere: Secondary | ICD-10-CM | POA: Diagnosis not present

## 2022-09-11 DIAGNOSIS — F0393 Unspecified dementia, unspecified severity, with mood disturbance: Secondary | ICD-10-CM | POA: Diagnosis not present

## 2022-09-11 DIAGNOSIS — K810 Acute cholecystitis: Secondary | ICD-10-CM | POA: Diagnosis not present

## 2022-09-11 DIAGNOSIS — Z950 Presence of cardiac pacemaker: Secondary | ICD-10-CM | POA: Diagnosis not present

## 2022-09-11 DIAGNOSIS — Z556 Problems related to health literacy: Secondary | ICD-10-CM | POA: Diagnosis not present

## 2022-09-11 DIAGNOSIS — E559 Vitamin D deficiency, unspecified: Secondary | ICD-10-CM | POA: Diagnosis not present

## 2022-09-11 DIAGNOSIS — Z4803 Encounter for change or removal of drains: Secondary | ICD-10-CM | POA: Diagnosis not present

## 2022-09-11 DIAGNOSIS — B961 Klebsiella pneumoniae [K. pneumoniae] as the cause of diseases classified elsewhere: Secondary | ICD-10-CM | POA: Diagnosis not present

## 2022-09-11 DIAGNOSIS — Z434 Encounter for attention to other artificial openings of digestive tract: Secondary | ICD-10-CM | POA: Diagnosis not present

## 2022-09-11 DIAGNOSIS — M810 Age-related osteoporosis without current pathological fracture: Secondary | ICD-10-CM | POA: Diagnosis not present

## 2022-09-13 DIAGNOSIS — M810 Age-related osteoporosis without current pathological fracture: Secondary | ICD-10-CM | POA: Diagnosis not present

## 2022-09-13 DIAGNOSIS — F03B0 Unspecified dementia, moderate, without behavioral disturbance, psychotic disturbance, mood disturbance, and anxiety: Secondary | ICD-10-CM | POA: Diagnosis not present

## 2022-09-13 DIAGNOSIS — D649 Anemia, unspecified: Secondary | ICD-10-CM | POA: Diagnosis not present

## 2022-09-13 DIAGNOSIS — E878 Other disorders of electrolyte and fluid balance, not elsewhere classified: Secondary | ICD-10-CM | POA: Diagnosis not present

## 2022-09-13 DIAGNOSIS — E441 Mild protein-calorie malnutrition: Secondary | ICD-10-CM | POA: Diagnosis not present

## 2022-09-13 DIAGNOSIS — R4181 Age-related cognitive decline: Secondary | ICD-10-CM | POA: Diagnosis not present

## 2022-09-13 DIAGNOSIS — Z4803 Encounter for change or removal of drains: Secondary | ICD-10-CM | POA: Diagnosis not present

## 2022-09-13 DIAGNOSIS — Z950 Presence of cardiac pacemaker: Secondary | ICD-10-CM | POA: Diagnosis not present

## 2022-09-13 DIAGNOSIS — I10 Essential (primary) hypertension: Secondary | ICD-10-CM | POA: Diagnosis not present

## 2022-09-13 DIAGNOSIS — Z434 Encounter for attention to other artificial openings of digestive tract: Secondary | ICD-10-CM | POA: Diagnosis not present

## 2022-09-13 DIAGNOSIS — R64 Cachexia: Secondary | ICD-10-CM | POA: Diagnosis not present

## 2022-09-13 DIAGNOSIS — R54 Age-related physical debility: Secondary | ICD-10-CM | POA: Diagnosis not present

## 2022-09-13 DIAGNOSIS — R413 Other amnesia: Secondary | ICD-10-CM | POA: Diagnosis not present

## 2022-09-13 DIAGNOSIS — B961 Klebsiella pneumoniae [K. pneumoniae] as the cause of diseases classified elsewhere: Secondary | ICD-10-CM | POA: Diagnosis not present

## 2022-09-13 DIAGNOSIS — Z8719 Personal history of other diseases of the digestive system: Secondary | ICD-10-CM | POA: Diagnosis not present

## 2022-09-13 DIAGNOSIS — B9620 Unspecified Escherichia coli [E. coli] as the cause of diseases classified elsewhere: Secondary | ICD-10-CM | POA: Diagnosis not present

## 2022-09-13 DIAGNOSIS — K810 Acute cholecystitis: Secondary | ICD-10-CM | POA: Diagnosis not present

## 2022-09-17 DIAGNOSIS — Z434 Encounter for attention to other artificial openings of digestive tract: Secondary | ICD-10-CM | POA: Diagnosis not present

## 2022-09-17 DIAGNOSIS — Z4803 Encounter for change or removal of drains: Secondary | ICD-10-CM | POA: Diagnosis not present

## 2022-09-17 DIAGNOSIS — K810 Acute cholecystitis: Secondary | ICD-10-CM | POA: Diagnosis not present

## 2022-09-17 DIAGNOSIS — I10 Essential (primary) hypertension: Secondary | ICD-10-CM | POA: Diagnosis not present

## 2022-09-17 DIAGNOSIS — B9620 Unspecified Escherichia coli [E. coli] as the cause of diseases classified elsewhere: Secondary | ICD-10-CM | POA: Diagnosis not present

## 2022-09-17 DIAGNOSIS — B961 Klebsiella pneumoniae [K. pneumoniae] as the cause of diseases classified elsewhere: Secondary | ICD-10-CM | POA: Diagnosis not present

## 2022-09-18 DIAGNOSIS — B9620 Unspecified Escherichia coli [E. coli] as the cause of diseases classified elsewhere: Secondary | ICD-10-CM | POA: Diagnosis not present

## 2022-09-18 DIAGNOSIS — Z4803 Encounter for change or removal of drains: Secondary | ICD-10-CM | POA: Diagnosis not present

## 2022-09-18 DIAGNOSIS — I10 Essential (primary) hypertension: Secondary | ICD-10-CM | POA: Diagnosis not present

## 2022-09-18 DIAGNOSIS — B961 Klebsiella pneumoniae [K. pneumoniae] as the cause of diseases classified elsewhere: Secondary | ICD-10-CM | POA: Diagnosis not present

## 2022-09-18 DIAGNOSIS — Z434 Encounter for attention to other artificial openings of digestive tract: Secondary | ICD-10-CM | POA: Diagnosis not present

## 2022-09-18 DIAGNOSIS — K810 Acute cholecystitis: Secondary | ICD-10-CM | POA: Diagnosis not present

## 2022-09-19 DIAGNOSIS — K810 Acute cholecystitis: Secondary | ICD-10-CM | POA: Diagnosis not present

## 2022-09-19 DIAGNOSIS — Z4803 Encounter for change or removal of drains: Secondary | ICD-10-CM | POA: Diagnosis not present

## 2022-09-19 DIAGNOSIS — B9620 Unspecified Escherichia coli [E. coli] as the cause of diseases classified elsewhere: Secondary | ICD-10-CM | POA: Diagnosis not present

## 2022-09-19 DIAGNOSIS — Z434 Encounter for attention to other artificial openings of digestive tract: Secondary | ICD-10-CM | POA: Diagnosis not present

## 2022-09-19 DIAGNOSIS — B961 Klebsiella pneumoniae [K. pneumoniae] as the cause of diseases classified elsewhere: Secondary | ICD-10-CM | POA: Diagnosis not present

## 2022-09-19 DIAGNOSIS — I10 Essential (primary) hypertension: Secondary | ICD-10-CM | POA: Diagnosis not present

## 2022-09-24 ENCOUNTER — Telehealth: Payer: Self-pay

## 2022-09-24 DIAGNOSIS — K810 Acute cholecystitis: Secondary | ICD-10-CM | POA: Diagnosis not present

## 2022-09-24 DIAGNOSIS — B9620 Unspecified Escherichia coli [E. coli] as the cause of diseases classified elsewhere: Secondary | ICD-10-CM | POA: Diagnosis not present

## 2022-09-24 DIAGNOSIS — B961 Klebsiella pneumoniae [K. pneumoniae] as the cause of diseases classified elsewhere: Secondary | ICD-10-CM | POA: Diagnosis not present

## 2022-09-24 DIAGNOSIS — Z4803 Encounter for change or removal of drains: Secondary | ICD-10-CM | POA: Diagnosis not present

## 2022-09-24 DIAGNOSIS — Z434 Encounter for attention to other artificial openings of digestive tract: Secondary | ICD-10-CM | POA: Diagnosis not present

## 2022-09-24 DIAGNOSIS — I10 Essential (primary) hypertension: Secondary | ICD-10-CM | POA: Diagnosis not present

## 2022-09-24 NOTE — Telephone Encounter (Signed)
Spoke with patient's daughter, who was aware of arrival time and location for appointment.

## 2022-09-25 ENCOUNTER — Ambulatory Visit
Admission: RE | Admit: 2022-09-25 | Discharge: 2022-09-25 | Disposition: A | Discharge status: Home / Self Care | Payer: Medicare Other | Source: Ambulatory Visit | Attending: Radiology | Admitting: Radiology

## 2022-09-25 ENCOUNTER — Other Ambulatory Visit: Payer: Self-pay | Admitting: Interventional Radiology

## 2022-09-25 ENCOUNTER — Other Ambulatory Visit: Payer: Self-pay | Admitting: Radiology

## 2022-09-25 ENCOUNTER — Other Ambulatory Visit: Payer: Self-pay

## 2022-09-25 DIAGNOSIS — K819 Cholecystitis, unspecified: Secondary | ICD-10-CM

## 2022-09-25 DIAGNOSIS — Z434 Encounter for attention to other artificial openings of digestive tract: Secondary | ICD-10-CM | POA: Diagnosis not present

## 2022-09-25 DIAGNOSIS — E860 Dehydration: Secondary | ICD-10-CM

## 2022-09-25 DIAGNOSIS — N3090 Cystitis, unspecified without hematuria: Secondary | ICD-10-CM

## 2022-09-25 DIAGNOSIS — K810 Acute cholecystitis: Secondary | ICD-10-CM

## 2022-09-25 DIAGNOSIS — K8020 Calculus of gallbladder without cholecystitis without obstruction: Secondary | ICD-10-CM | POA: Diagnosis not present

## 2022-09-25 HISTORY — PX: IR EXCHANGE BILIARY DRAIN: IMG6046

## 2022-09-25 MED ORDER — LIDOCAINE HCL 1 % IJ SOLN
10.0000 mL | Freq: Once | INTRAMUSCULAR | Status: AC
  Administered 2022-09-25: 10 mL via INTRADERMAL

## 2022-09-25 MED ORDER — LIDOCAINE HCL 1 % IJ SOLN
INTRAMUSCULAR | Status: AC
  Filled 2022-09-25: qty 20

## 2022-09-25 MED ORDER — NORMAL SALINE FLUSH 0.9 % IV SOLN
10.0000 mL | Freq: Every day | INTRAVENOUS | 0 refills | Status: DC
  Filled 2022-09-25: qty 300, 30d supply, fill #0

## 2022-09-25 MED ORDER — IOHEXOL 300 MG/ML  SOLN
15.0000 mL | Freq: Once | INTRAMUSCULAR | Status: AC | PRN
  Administered 2022-09-25: 15 mL

## 2022-09-25 NOTE — Procedures (Signed)
Vascular and Interventional Radiology Procedure Note  Patient: Tammy Dillon DOB: November 04, 1929 Medical Record Number: [id number] Note Date/Time: 09/25/22 3:34 PM   Performing Physician: Roanna Banning, MD Assistant(s): None  Diagnosis: Hx of acute cholecystitis. Drain placed 08/15/22  Procedure:  CHOLECYSTOSTOMY TUBE EXCHANGE ANTEROGRADE CHOLANGIOGRAM  Anesthesia: Local Anesthetic Complications: None Estimated Blood Loss:  0 mL Specimens:  None  Findings:  Successful exchange for a new 18F cholecystostomy tube. Patent cystic duct  Plan: Flush tube w 5 mL sterile NS q8h and record drain output qShift. Follow up for routine tube evaluation in 8 week(s).   I had a conversation with the Pt's Son and Daughter Rinaldo Cloud and Junell Cullifer) Re her tube and that her surgeon fears that she may not be a surgical candidate.  I briefly discussed percutaneous cholangioscopy "SpyGlass" and removal / lithotripsy of her gallstones in an effort to rid her of her chronic tube.  I offered a clinic visit for a formal conversation about the procedure should they be interested in learning more.  See detailed procedure note with images in PACS. The patient tolerated the procedure well without incident or complication and was returned to [facility] in stable condition.    Roanna Banning, MD [facility] Radiology   Pager. [phone] Clinic. [phone]

## 2022-09-26 DIAGNOSIS — B9620 Unspecified Escherichia coli [E. coli] as the cause of diseases classified elsewhere: Secondary | ICD-10-CM | POA: Diagnosis not present

## 2022-09-26 DIAGNOSIS — K810 Acute cholecystitis: Secondary | ICD-10-CM | POA: Diagnosis not present

## 2022-09-26 DIAGNOSIS — Z4803 Encounter for change or removal of drains: Secondary | ICD-10-CM | POA: Diagnosis not present

## 2022-09-26 DIAGNOSIS — B961 Klebsiella pneumoniae [K. pneumoniae] as the cause of diseases classified elsewhere: Secondary | ICD-10-CM | POA: Diagnosis not present

## 2022-09-26 DIAGNOSIS — Z434 Encounter for attention to other artificial openings of digestive tract: Secondary | ICD-10-CM | POA: Diagnosis not present

## 2022-09-26 DIAGNOSIS — I10 Essential (primary) hypertension: Secondary | ICD-10-CM | POA: Diagnosis not present

## 2022-09-27 ENCOUNTER — Other Ambulatory Visit: Payer: Self-pay

## 2022-09-27 DIAGNOSIS — K8010 Calculus of gallbladder with chronic cholecystitis without obstruction: Secondary | ICD-10-CM | POA: Diagnosis not present

## 2022-09-28 ENCOUNTER — Other Ambulatory Visit: Payer: Self-pay | Admitting: Interventional Radiology

## 2022-09-28 DIAGNOSIS — K810 Acute cholecystitis: Secondary | ICD-10-CM

## 2022-10-02 DIAGNOSIS — Z434 Encounter for attention to other artificial openings of digestive tract: Secondary | ICD-10-CM | POA: Diagnosis not present

## 2022-10-02 DIAGNOSIS — Z4803 Encounter for change or removal of drains: Secondary | ICD-10-CM | POA: Diagnosis not present

## 2022-10-02 DIAGNOSIS — B9620 Unspecified Escherichia coli [E. coli] as the cause of diseases classified elsewhere: Secondary | ICD-10-CM | POA: Diagnosis not present

## 2022-10-02 DIAGNOSIS — K810 Acute cholecystitis: Secondary | ICD-10-CM | POA: Diagnosis not present

## 2022-10-02 DIAGNOSIS — B961 Klebsiella pneumoniae [K. pneumoniae] as the cause of diseases classified elsewhere: Secondary | ICD-10-CM | POA: Diagnosis not present

## 2022-10-02 DIAGNOSIS — I10 Essential (primary) hypertension: Secondary | ICD-10-CM | POA: Diagnosis not present

## 2022-10-03 DIAGNOSIS — K810 Acute cholecystitis: Secondary | ICD-10-CM | POA: Diagnosis not present

## 2022-10-03 DIAGNOSIS — B961 Klebsiella pneumoniae [K. pneumoniae] as the cause of diseases classified elsewhere: Secondary | ICD-10-CM | POA: Diagnosis not present

## 2022-10-03 DIAGNOSIS — Z434 Encounter for attention to other artificial openings of digestive tract: Secondary | ICD-10-CM | POA: Diagnosis not present

## 2022-10-03 DIAGNOSIS — B9620 Unspecified Escherichia coli [E. coli] as the cause of diseases classified elsewhere: Secondary | ICD-10-CM | POA: Diagnosis not present

## 2022-10-03 DIAGNOSIS — I10 Essential (primary) hypertension: Secondary | ICD-10-CM | POA: Diagnosis not present

## 2022-10-03 DIAGNOSIS — Z4803 Encounter for change or removal of drains: Secondary | ICD-10-CM | POA: Diagnosis not present

## 2022-10-04 DIAGNOSIS — B961 Klebsiella pneumoniae [K. pneumoniae] as the cause of diseases classified elsewhere: Secondary | ICD-10-CM | POA: Diagnosis not present

## 2022-10-04 DIAGNOSIS — I10 Essential (primary) hypertension: Secondary | ICD-10-CM | POA: Diagnosis not present

## 2022-10-04 DIAGNOSIS — B9620 Unspecified Escherichia coli [E. coli] as the cause of diseases classified elsewhere: Secondary | ICD-10-CM | POA: Diagnosis not present

## 2022-10-04 DIAGNOSIS — Z4803 Encounter for change or removal of drains: Secondary | ICD-10-CM | POA: Diagnosis not present

## 2022-10-04 DIAGNOSIS — Z434 Encounter for attention to other artificial openings of digestive tract: Secondary | ICD-10-CM | POA: Diagnosis not present

## 2022-10-04 DIAGNOSIS — K810 Acute cholecystitis: Secondary | ICD-10-CM | POA: Diagnosis not present

## 2022-10-05 DIAGNOSIS — Z434 Encounter for attention to other artificial openings of digestive tract: Secondary | ICD-10-CM | POA: Diagnosis not present

## 2022-10-05 DIAGNOSIS — B961 Klebsiella pneumoniae [K. pneumoniae] as the cause of diseases classified elsewhere: Secondary | ICD-10-CM | POA: Diagnosis not present

## 2022-10-05 DIAGNOSIS — K810 Acute cholecystitis: Secondary | ICD-10-CM | POA: Diagnosis not present

## 2022-10-05 DIAGNOSIS — Z4803 Encounter for change or removal of drains: Secondary | ICD-10-CM | POA: Diagnosis not present

## 2022-10-05 DIAGNOSIS — B9620 Unspecified Escherichia coli [E. coli] as the cause of diseases classified elsewhere: Secondary | ICD-10-CM | POA: Diagnosis not present

## 2022-10-05 DIAGNOSIS — I10 Essential (primary) hypertension: Secondary | ICD-10-CM | POA: Diagnosis not present

## 2022-10-08 ENCOUNTER — Ambulatory Visit: Admission: RE | Admit: 2022-10-08 | Payer: Medicare Other | Source: Ambulatory Visit | Admitting: Radiology

## 2022-10-08 DIAGNOSIS — K8011 Calculus of gallbladder with chronic cholecystitis with obstruction: Secondary | ICD-10-CM | POA: Diagnosis not present

## 2022-10-08 DIAGNOSIS — K810 Acute cholecystitis: Secondary | ICD-10-CM | POA: Diagnosis not present

## 2022-10-08 DIAGNOSIS — Z434 Encounter for attention to other artificial openings of digestive tract: Secondary | ICD-10-CM | POA: Insufficient documentation

## 2022-10-08 HISTORY — PX: IR EXCHANGE BILIARY DRAIN: IMG6046

## 2022-10-08 MED ORDER — LIDOCAINE HCL 1 % IJ SOLN
INTRAMUSCULAR | Status: AC
  Filled 2022-10-08: qty 20

## 2022-10-08 MED ORDER — LIDOCAINE HCL 1 % IJ SOLN
2.0000 mL | Freq: Once | INTRAMUSCULAR | Status: AC
  Administered 2022-10-08: 4 mL via INTRADERMAL

## 2022-10-08 MED ORDER — IOHEXOL 300 MG/ML  SOLN
8.0000 mL | Freq: Once | INTRAMUSCULAR | Status: AC | PRN
  Administered 2022-10-08: 8 mL

## 2022-10-08 NOTE — Procedures (Signed)
Interventional Radiology Procedure Note  Procedure: FLUORO CHOLECYSTOSTOMY EXCHG    Complications: None  Estimated Blood Loss:  0  Findings: 10 FR TUBE PATENT CYSTIC DUCT AND CBD CHOLELITHIASIS PRESENT    Sharen Counter, MD

## 2022-10-09 ENCOUNTER — Telehealth: Payer: Medicare Other

## 2022-10-09 DIAGNOSIS — K810 Acute cholecystitis: Secondary | ICD-10-CM

## 2022-10-09 DIAGNOSIS — I10 Essential (primary) hypertension: Secondary | ICD-10-CM | POA: Diagnosis not present

## 2022-10-09 DIAGNOSIS — Z434 Encounter for attention to other artificial openings of digestive tract: Secondary | ICD-10-CM | POA: Diagnosis not present

## 2022-10-09 DIAGNOSIS — B9620 Unspecified Escherichia coli [E. coli] as the cause of diseases classified elsewhere: Secondary | ICD-10-CM | POA: Diagnosis not present

## 2022-10-09 DIAGNOSIS — K8020 Calculus of gallbladder without cholecystitis without obstruction: Secondary | ICD-10-CM | POA: Diagnosis not present

## 2022-10-09 DIAGNOSIS — Z4803 Encounter for change or removal of drains: Secondary | ICD-10-CM | POA: Diagnosis not present

## 2022-10-09 DIAGNOSIS — B961 Klebsiella pneumoniae [K. pneumoniae] as the cause of diseases classified elsewhere: Secondary | ICD-10-CM | POA: Diagnosis not present

## 2022-10-09 HISTORY — PX: IR RADIOLOGIST EVAL & MGMT: IMG5224

## 2022-10-09 NOTE — H&P (Addendum)
Reason for visit: Cholelithiasis with indwelling cholecystostomy tube. Referral for "Spyglass".   Care Team: PCP; Tammy Aspen, MD Cardiology; Tammy Prude, MD  Surgery; Tammy Shiver, MD   Virtual Visit via Telephone Note    I connected with Pt's adult Son and Daughter Tammy Dillon and Tammy Dillon, her POA and [facility]) on 10/09/22 by telephone and verified that I am speaking about the correct person using two identifiers. I discussed the limitations, risks, security and privacy concerns of performing an evaluation and management service by telephone and the availability of in-person appointments.   History of Present Illness:  Tammy Dillon is a 89 y/o F comorbid w PMHx significant for HTN and CAD w recurrent syncope s/p Pacemaker who initially presented to [facility] with N/V and abdominal pain with investigations including US Abdomen revealing for acute cholecystitis. Pt was deemed not a surgical candidate given her age and comorbidities and is known to the VIR service s/p cholecystostomy tube placement by my colleague, Tammy Dillon, on 08/15/22. She was seen in follow up by surgeon, Tammy Dillon, who spoke with me about an option for percutaneous clearance of her gallstones and a referral was made for evaluation. On chart review, the Pt had a previous instance of choledocholithiasis in 02/2019, which was cleared with ERCP by GI, Tammy Tammy Dillon.  The Pt's adult children are her POA and we had discussed briefly, during a recent routine drain evaluation and exchange, percutaneous cholangioscopy "SpyGlass" with removal / lithotripsy of her gallstones in an effort to rid her of her chronic tube. ROS was positive for depressed appetite since her gallbladder dysfunction.   Review of Systems: A 12-point ROS discussed, and pertinent positives are indicated in the HPI above.  All other systems are negative.   Past Medical History:  Diagnosis Date   Complication of  anesthesia    Nausea/Vomiting   Hypertension     Past Surgical History:  Procedure Laterality Date   ERCP N/A 02/27/2019   Procedure: ENDOSCOPIC RETROGRADE CHOLANGIOPANCREATOGRAPHY (ERCP);  Surgeon: Tammy Rigger, MD;  Location: [facility];  Service: Endoscopy;  Laterality: N/A;   IR EXCHANGE BILIARY DRAIN  09/25/2022   IR EXCHANGE BILIARY DRAIN  10/08/2022   IR PERC CHOLECYSTOSTOMY  08/15/2022   IR RADIOLOGIST EVAL & MGMT  10/09/2022   PACEMAKER IMPLANT N/A 11/09/2019   Procedure: PACEMAKER IMPLANT;  Surgeon: Tammy Lemming, MD;  Location: [facility] INVASIVE CV LAB;  Service: Cardiovascular;  Laterality: N/A;   REMOVAL OF STONES  02/27/2019   Procedure: REMOVAL OF STONES;  Surgeon: Tammy Rigger, MD;  Location: [facility] ENDOSCOPY;  Service: Endoscopy;;   SPHINCTEROTOMY  02/27/2019   Procedure: Dennison Mascot;  Surgeon: Tammy Rigger, MD;  Location: [facility] ENDOSCOPY;  Service: Endoscopy;;    Allergies: Aldara [imiquimod], Other, and Codeine  Medications: Prior to Admission medications   Medication Sig Start Date End Date Taking? Authorizing Provider  Cholecalciferol (VITAMIN D3) 50 MCG (2000 UT) TABS Take 2,000 Units by mouth every evening.    [provider]  desloratadine (CLARINEX) 5 MG tablet Take 5 mg by mouth every evening.  02/20/19   [provider]  donepezil (ARICEPT) 5 MG tablet Take 5 mg by mouth daily.    [provider]  ondansetron (ZOFRAN) 4 MG tablet Take 4 mg by mouth as needed for nausea or vomiting. 12/31/19   [provider]  OVER THE COUNTER MEDICATION Take 7.5-10 mLs by mouth See admin instructions. Superior Elemental Collodial Silver - Chemical Free- Immune  Support- Take 7.5-10 ml's by mouth once a day    [provider]  raloxifene (EVISTA) 60 MG tablet Take 60 mg by mouth every evening. 02/20/19   [provider]  rosuvastatin (CRESTOR) 5 MG tablet Take 5 mg by mouth daily. Take one tablet Monday to Friday. 02/20/19   [provider]  Sodium Chloride Flush (NORMAL SALINE FLUSH) 0.9 % SOLN Inject 10 mLs into the vein daily. Inject 10 ml into drain daily. 09/25/22   Tammy Mires, NP  zinc gluconate 50 MG tablet Take 25 mg by mouth daily.    [provider]     Family History  Problem Relation Age of Onset   Cancer Mother        breast   Cancer - Lung Father    Diabetes Son     Social History   Socioeconomic History   Marital status: Widowed    Spouse name: Not on file   Number of children: Not on file   Years of education: Not on file   Highest education level: Not on file  Occupational History   Not on file  Tobacco Use   Smoking status: Never   Smokeless tobacco: Never  Substance and Sexual Activity   Alcohol use: Never   Drug use: Never   Sexual activity: Not on file  Other Topics Concern   Not on file  Social History Narrative   Not on file   Social Determinants of Health   Financial Resource Strain: Not on file  Food Insecurity: No Food Insecurity (08/15/2022)   Hunger Vital Sign    Worried About Running Out of Food in the Last Year: Never true    Ran Out of Food in the Last Year: Never true  Transportation Needs: No Transportation Needs (08/15/2022)   PRAPARE - Administrator, Civil Service (Medical): No    Lack of Transportation (Non-Medical): No  Physical Activity: Not on file  Stress: Not on file  Social Connections: Not on file    Review of Systems As above  Vital Signs: There were no vitals taken for this visit.  Physical Exam Deferred secondary to virtual visit.    Imaging:  IR cholangiogram; 09/25/22 and US Abdomen, 08/15/22 Independently reviewed demonstrating cholelithiasis    IR EXCHANGE BILIARY DRAIN  Result Date: 10/08/2022 INDICATION: Calculus cholecystitis EXAM: FLUOROSCOPIC CHOLECYSTOSTOMY EXCHANGE MEDICATIONS: 1% lidocaine local ANESTHESIA/SEDATION: None. FLUOROSCOPY: Radiation Exposure Index (as provided by the  fluoroscopic device): 1.0 mGy Kerma COMPLICATIONS: None immediate. PROCEDURE: Informed written consent was obtained from the patient after a thorough discussion of the procedural risks, benefits and alternatives. All questions were addressed. Maximal Sterile Barrier Technique was utilized including caps, mask, sterile gowns, sterile gloves, sterile drape, hand hygiene and skin antiseptic. A timeout was performed prior to the initiation of the procedure. Under sterile conditions and local anesthesia, the existing cholecystostomy was injected initially with contrast confirming position in the gallbladder. Cystic duct and biliary tree are patent. Contrast does drain into the duodenum. Successful exchange performed over an Amplatz guidewire. Retention loop formed the gallbladder. Contrast injection reconfirms position. Images obtained for documentation. Cholelithiasis evident. Catheter secured with a silk suture and a sterile dressing. Gravity drainage bag connected. IMPRESSION: 1. Successful cholecystostomy tube exchange. 2. Cholelithiasis. 3. Patent cystic duct and common bile duct. Electronically Signed   By: Judie Petit.  Shick M.D.   On: 10/08/2022 15:22   IR EXCHANGE BILIARY DRAIN  Result Date: 09/25/2022 INDICATION: History of acute  cholecystitis, post ultrasound fluoroscopic guided cholecystostomy tube placement on 08/15/2022. EXAM: Procedures: 1. ANTEGRADE CHOLANGIOGRAM 2. CHOLECYSTOSTOMY TUBE EXCHANGE COMPARISON:  IR fluoroscopy, 08/15/2022.  US abdomen, 08/15/2022. MEDICATIONS: 10 mL lidocaine 1% ANESTHESIA/SEDATION: Local anesthetic was administered. CONTRAST:  15mL OMNIPAQUE IOHEXOL 300 MG/ML SOLN - administered into the gallbladder lumen. FLUOROSCOPY TIME:  Fluoroscopic dose; 9.5 mGy COMPLICATIONS: None immediate. PROCEDURE: The patient was positioned supine on the fluoroscopy table. The external portion of the existing cholecystostomy tube as well as the surrounding skin was prepped and draped in usual sterile  fashion. A time-out was performed prior to the initiation of the procedure. A preprocedural spot fluoroscopic image was obtained of the right upper abdominal quadrant existing cholecystostomy tube. The skin surrounding the cholecystostomy tube was anesthetized with 1% lidocaine with epinephrine. The external portion of the cholecystostomy tube was cut and cannulated with a short Amplatz wire which was advanced through the tube and coiled within the gallbladder lumen. Next, under intermittent fluoroscopic guidance, the existing 10 Fr cholecystostomy tube was exchanged for a new 10 Fr cholecystostomy tube. Contrast injection confirms appropriate positioning and functionality of the cholecystomy tube. The cholecystostomy tube was flushed with a small amount of saline and reconnected to a gravity bag. The cholecystostomy tube was secured with an interrupted suture and a Stat Lock device. A dressing was applied. The patient tolerated the procedure well without immediate postprocedural complication. FINDINGS: *Preprocedural spot fluoroscopic image demonstrates unchanged positioning of cholecystostomy tube with end coiled and locked over the expected location of the fundus of the gallbladder *Post exchange cholangiogram demonstrates appropriate positioning and functionality of the new cholecystostomy tube. *Anterior cholangiogram demonstrates multiple gallstones within nondistended gallbladder. *Patent cystic duct, with normal common bile duct dilatation or biliary filling defect. IMPRESSION: Successful exchange of a 10 Fr cholecystostomy tube. PLAN: The patient's cholecystostomy tube was reconnected to a gravity bag. She may return to the Vascular Interventional Radiology (VIR) in 2 months for routine exchange. I had a conversation with the Pt's Son and Daughter Rinaldo Cloud and Chivonne Rascon) Re her tube and that her surgeon fears that she may not be a surgical candidate. I briefly discussed percutaneous cholangioscopy  "SpyGlass" and removal / lithotripsy of her gallstones in an effort to rid her of her chronic tube. I offered a clinic visit for a formal conversation about the procedure should they be interested in learning more. Roanna Banning, MD [facility] Electronically Signed   By: Roanna Banning M.D.   On: 09/25/2022 15:32    Labs:  CBC: Recent Labs    08/15/22 1251 08/16/22 0400 08/17/22 0518 08/19/22 0506  WBC 20.4* 17.5* 11.1* 8.4  HGB 12.8 10.3* 9.3* 9.1*  HCT 38.3 30.8* 27.1* 27.6*  PLT 204 166 155 188    COAGS: Recent Labs    08/15/22 1251  INR 1.3*    BMP: Recent Labs    08/15/22 1251 08/16/22 0400 08/17/22 0518 08/19/22 0506  NA 135 136 135 134*  K 3.5 3.7 3.2* 4.0  CL 98 99 101 102  CO2 26 27 27 26   GLUCOSE 128* 107* 88 90  BUN 23 28* 26* 21  CALCIUM 9.0 8.4* 8.5* 8.5*  CREATININE 0.98 1.06* 0.90 0.74  GFRNONAA 55* 50* >60 >60    LIVER FUNCTION TESTS: Recent Labs    08/14/22 2325 08/16/22 0400 08/17/22 0518 08/19/22 0506  BILITOT 0.9 0.7 0.5 0.5  AST 30 27 27  35  ALT 14 14 18 28   ALKPHOS 47 37*  37* 46  PROT 7.3 5.5* 5.4* 5.8*  ALBUMIN 3.9 2.7* 2.3* 2.4*    Assessment and Plan:  89 y/o F w PMHx significant for HTN and CAD w recurrent syncope s/p Pacemaker. Pt is known to the VIR service s/p cholecystostomy tube placement on 08/15/22. Most recent drain evaluation on 10/08/22 revealing for significant burden of cholelithiasis. Her cystic duct and CBD were patent. She was deemed not a surgical candidate given her age and comorbidities.  Pt's family desire her to be tube independent and are interested in pursuing a minimally-invasive option for the treatment of her gallstones at this time. We discussed percutaneous cholangioscopy (SpyGlass), lithotripsy and biliary drain exchange.   The procedure has been fully reviewed with the patient/patient's authorized representative. The risks, benefits and  alternatives have been explained, and the patient/patient's authorized representative has consented to the procedure.   *IR cholangiogram reviewed. No additional imaging required. *Proceed to schedule ASAP based on mutual availability. *Procedure to be performed at [facility], under moderate sedation. *Electronics engineer Conservation officer, historic buildings / SpyGlass) presence. *Same day procedure, no overnight admission.   Thank you for this interesting consult.  I greatly enjoyed meeting Tammy Dillon and look forward to participating in their care.  A copy of this report was sent to the requesting provider on this date.  Electronically Signed:  Roanna Banning, MD [facility]   Pager. [phone] Clinic. [phone]  I spent a total of  40 Minutes of non-face-to-face time in clinical consultation, greater than 50% of which was counseling/coordinating care for Tammy Dillon' cholelithiasis and tube dependence secondary to comorbidities.

## 2022-10-10 ENCOUNTER — Other Ambulatory Visit: Payer: Self-pay | Admitting: Interventional Radiology

## 2022-10-10 DIAGNOSIS — I10 Essential (primary) hypertension: Secondary | ICD-10-CM | POA: Diagnosis not present

## 2022-10-10 DIAGNOSIS — B961 Klebsiella pneumoniae [K. pneumoniae] as the cause of diseases classified elsewhere: Secondary | ICD-10-CM | POA: Diagnosis not present

## 2022-10-10 DIAGNOSIS — Z4803 Encounter for change or removal of drains: Secondary | ICD-10-CM | POA: Diagnosis not present

## 2022-10-10 DIAGNOSIS — K810 Acute cholecystitis: Secondary | ICD-10-CM | POA: Diagnosis not present

## 2022-10-10 DIAGNOSIS — Z434 Encounter for attention to other artificial openings of digestive tract: Secondary | ICD-10-CM | POA: Diagnosis not present

## 2022-10-10 DIAGNOSIS — B9620 Unspecified Escherichia coli [E. coli] as the cause of diseases classified elsewhere: Secondary | ICD-10-CM | POA: Diagnosis not present

## 2022-10-10 DIAGNOSIS — K8020 Calculus of gallbladder without cholecystitis without obstruction: Secondary | ICD-10-CM

## 2022-10-11 DIAGNOSIS — Z556 Problems related to health literacy: Secondary | ICD-10-CM | POA: Diagnosis not present

## 2022-10-11 DIAGNOSIS — Z79891 Long term (current) use of opiate analgesic: Secondary | ICD-10-CM | POA: Diagnosis not present

## 2022-10-11 DIAGNOSIS — M810 Age-related osteoporosis without current pathological fracture: Secondary | ICD-10-CM | POA: Diagnosis not present

## 2022-10-11 DIAGNOSIS — K810 Acute cholecystitis: Secondary | ICD-10-CM | POA: Diagnosis not present

## 2022-10-11 DIAGNOSIS — Z4803 Encounter for change or removal of drains: Secondary | ICD-10-CM | POA: Diagnosis not present

## 2022-10-11 DIAGNOSIS — I10 Essential (primary) hypertension: Secondary | ICD-10-CM | POA: Diagnosis not present

## 2022-10-11 DIAGNOSIS — F0393 Unspecified dementia, unspecified severity, with mood disturbance: Secondary | ICD-10-CM | POA: Diagnosis not present

## 2022-10-11 DIAGNOSIS — B961 Klebsiella pneumoniae [K. pneumoniae] as the cause of diseases classified elsewhere: Secondary | ICD-10-CM | POA: Diagnosis not present

## 2022-10-11 DIAGNOSIS — Z434 Encounter for attention to other artificial openings of digestive tract: Secondary | ICD-10-CM | POA: Diagnosis not present

## 2022-10-11 DIAGNOSIS — B9620 Unspecified Escherichia coli [E. coli] as the cause of diseases classified elsewhere: Secondary | ICD-10-CM | POA: Diagnosis not present

## 2022-10-11 DIAGNOSIS — Z950 Presence of cardiac pacemaker: Secondary | ICD-10-CM | POA: Diagnosis not present

## 2022-10-11 DIAGNOSIS — E559 Vitamin D deficiency, unspecified: Secondary | ICD-10-CM | POA: Diagnosis not present

## 2022-10-12 DIAGNOSIS — Z434 Encounter for attention to other artificial openings of digestive tract: Secondary | ICD-10-CM | POA: Diagnosis not present

## 2022-10-12 DIAGNOSIS — Z4803 Encounter for change or removal of drains: Secondary | ICD-10-CM | POA: Diagnosis not present

## 2022-10-12 DIAGNOSIS — I10 Essential (primary) hypertension: Secondary | ICD-10-CM | POA: Diagnosis not present

## 2022-10-12 DIAGNOSIS — B9620 Unspecified Escherichia coli [E. coli] as the cause of diseases classified elsewhere: Secondary | ICD-10-CM | POA: Diagnosis not present

## 2022-10-12 DIAGNOSIS — K810 Acute cholecystitis: Secondary | ICD-10-CM | POA: Diagnosis not present

## 2022-10-12 DIAGNOSIS — B961 Klebsiella pneumoniae [K. pneumoniae] as the cause of diseases classified elsewhere: Secondary | ICD-10-CM | POA: Diagnosis not present

## 2022-10-15 DIAGNOSIS — R413 Other amnesia: Secondary | ICD-10-CM | POA: Diagnosis not present

## 2022-10-15 DIAGNOSIS — B9620 Unspecified Escherichia coli [E. coli] as the cause of diseases classified elsewhere: Secondary | ICD-10-CM | POA: Diagnosis not present

## 2022-10-15 DIAGNOSIS — Z434 Encounter for attention to other artificial openings of digestive tract: Secondary | ICD-10-CM | POA: Diagnosis not present

## 2022-10-15 DIAGNOSIS — Z4803 Encounter for change or removal of drains: Secondary | ICD-10-CM | POA: Diagnosis not present

## 2022-10-15 DIAGNOSIS — K810 Acute cholecystitis: Secondary | ICD-10-CM | POA: Diagnosis not present

## 2022-10-15 DIAGNOSIS — E441 Mild protein-calorie malnutrition: Secondary | ICD-10-CM | POA: Diagnosis not present

## 2022-10-15 DIAGNOSIS — F03B0 Unspecified dementia, moderate, without behavioral disturbance, psychotic disturbance, mood disturbance, and anxiety: Secondary | ICD-10-CM | POA: Diagnosis not present

## 2022-10-15 DIAGNOSIS — R54 Age-related physical debility: Secondary | ICD-10-CM | POA: Diagnosis not present

## 2022-10-15 DIAGNOSIS — I10 Essential (primary) hypertension: Secondary | ICD-10-CM | POA: Diagnosis not present

## 2022-10-15 DIAGNOSIS — B961 Klebsiella pneumoniae [K. pneumoniae] as the cause of diseases classified elsewhere: Secondary | ICD-10-CM | POA: Diagnosis not present

## 2022-10-16 DIAGNOSIS — Z950 Presence of cardiac pacemaker: Secondary | ICD-10-CM | POA: Diagnosis not present

## 2022-10-16 DIAGNOSIS — E43 Unspecified severe protein-calorie malnutrition: Secondary | ICD-10-CM | POA: Diagnosis not present

## 2022-10-16 DIAGNOSIS — Z66 Do not resuscitate: Secondary | ICD-10-CM | POA: Diagnosis not present

## 2022-10-16 DIAGNOSIS — R64 Cachexia: Secondary | ICD-10-CM | POA: Diagnosis not present

## 2022-10-16 DIAGNOSIS — Z87440 Personal history of urinary (tract) infections: Secondary | ICD-10-CM | POA: Diagnosis not present

## 2022-10-16 DIAGNOSIS — Z515 Encounter for palliative care: Secondary | ICD-10-CM | POA: Diagnosis not present

## 2022-10-16 DIAGNOSIS — K8020 Calculus of gallbladder without cholecystitis without obstruction: Secondary | ICD-10-CM | POA: Diagnosis not present

## 2022-10-16 DIAGNOSIS — Z741 Need for assistance with personal care: Secondary | ICD-10-CM | POA: Diagnosis not present

## 2022-10-16 DIAGNOSIS — Z4803 Encounter for change or removal of drains: Secondary | ICD-10-CM | POA: Diagnosis not present

## 2022-10-18 DIAGNOSIS — K8020 Calculus of gallbladder without cholecystitis without obstruction: Secondary | ICD-10-CM | POA: Diagnosis not present

## 2022-10-18 DIAGNOSIS — Z4803 Encounter for change or removal of drains: Secondary | ICD-10-CM | POA: Diagnosis not present

## 2022-10-18 DIAGNOSIS — Z87440 Personal history of urinary (tract) infections: Secondary | ICD-10-CM | POA: Diagnosis not present

## 2022-10-18 DIAGNOSIS — Z741 Need for assistance with personal care: Secondary | ICD-10-CM | POA: Diagnosis not present

## 2022-10-18 DIAGNOSIS — E43 Unspecified severe protein-calorie malnutrition: Secondary | ICD-10-CM | POA: Diagnosis not present

## 2022-10-18 DIAGNOSIS — R64 Cachexia: Secondary | ICD-10-CM | POA: Diagnosis not present

## 2022-10-18 DIAGNOSIS — Z950 Presence of cardiac pacemaker: Secondary | ICD-10-CM | POA: Diagnosis not present

## 2022-10-18 DIAGNOSIS — Z515 Encounter for palliative care: Secondary | ICD-10-CM | POA: Diagnosis not present

## 2022-10-18 DIAGNOSIS — Z66 Do not resuscitate: Secondary | ICD-10-CM | POA: Diagnosis not present

## 2022-10-22 ENCOUNTER — Ambulatory Visit: Admission: RE | Admit: 2022-10-22 | Payer: Medicare Other | Source: Ambulatory Visit | Admitting: Radiology

## 2022-10-22 DIAGNOSIS — E43 Unspecified severe protein-calorie malnutrition: Secondary | ICD-10-CM | POA: Diagnosis not present

## 2022-10-22 DIAGNOSIS — Z4803 Encounter for change or removal of drains: Secondary | ICD-10-CM | POA: Diagnosis not present

## 2022-10-22 DIAGNOSIS — Z950 Presence of cardiac pacemaker: Secondary | ICD-10-CM | POA: Diagnosis not present

## 2022-10-22 DIAGNOSIS — Z87440 Personal history of urinary (tract) infections: Secondary | ICD-10-CM | POA: Diagnosis not present

## 2022-10-22 DIAGNOSIS — K8020 Calculus of gallbladder without cholecystitis without obstruction: Secondary | ICD-10-CM | POA: Diagnosis not present

## 2022-10-22 DIAGNOSIS — R64 Cachexia: Secondary | ICD-10-CM | POA: Diagnosis not present

## 2022-10-23 DIAGNOSIS — Z4803 Encounter for change or removal of drains: Secondary | ICD-10-CM | POA: Diagnosis not present

## 2022-10-23 DIAGNOSIS — E43 Unspecified severe protein-calorie malnutrition: Secondary | ICD-10-CM | POA: Diagnosis not present

## 2022-10-23 DIAGNOSIS — Z950 Presence of cardiac pacemaker: Secondary | ICD-10-CM | POA: Diagnosis not present

## 2022-10-23 DIAGNOSIS — R64 Cachexia: Secondary | ICD-10-CM | POA: Diagnosis not present

## 2022-10-23 DIAGNOSIS — K8020 Calculus of gallbladder without cholecystitis without obstruction: Secondary | ICD-10-CM | POA: Diagnosis not present

## 2022-10-23 DIAGNOSIS — Z87440 Personal history of urinary (tract) infections: Secondary | ICD-10-CM | POA: Diagnosis not present

## 2022-10-24 DIAGNOSIS — Z950 Presence of cardiac pacemaker: Secondary | ICD-10-CM | POA: Diagnosis not present

## 2022-10-24 DIAGNOSIS — E43 Unspecified severe protein-calorie malnutrition: Secondary | ICD-10-CM | POA: Diagnosis not present

## 2022-10-24 DIAGNOSIS — K8020 Calculus of gallbladder without cholecystitis without obstruction: Secondary | ICD-10-CM | POA: Diagnosis not present

## 2022-10-24 DIAGNOSIS — Z87440 Personal history of urinary (tract) infections: Secondary | ICD-10-CM | POA: Diagnosis not present

## 2022-10-24 DIAGNOSIS — R64 Cachexia: Secondary | ICD-10-CM | POA: Diagnosis not present

## 2022-10-24 DIAGNOSIS — Z4803 Encounter for change or removal of drains: Secondary | ICD-10-CM | POA: Diagnosis not present

## 2022-10-26 DIAGNOSIS — Z87440 Personal history of urinary (tract) infections: Secondary | ICD-10-CM | POA: Diagnosis not present

## 2022-10-26 DIAGNOSIS — Z4803 Encounter for change or removal of drains: Secondary | ICD-10-CM | POA: Diagnosis not present

## 2022-10-26 DIAGNOSIS — Z950 Presence of cardiac pacemaker: Secondary | ICD-10-CM | POA: Diagnosis not present

## 2022-10-26 DIAGNOSIS — K8020 Calculus of gallbladder without cholecystitis without obstruction: Secondary | ICD-10-CM | POA: Diagnosis not present

## 2022-10-26 DIAGNOSIS — R64 Cachexia: Secondary | ICD-10-CM | POA: Diagnosis not present

## 2022-10-26 DIAGNOSIS — E43 Unspecified severe protein-calorie malnutrition: Secondary | ICD-10-CM | POA: Diagnosis not present

## 2022-10-29 DIAGNOSIS — R64 Cachexia: Secondary | ICD-10-CM | POA: Diagnosis not present

## 2022-10-29 DIAGNOSIS — E43 Unspecified severe protein-calorie malnutrition: Secondary | ICD-10-CM | POA: Diagnosis not present

## 2022-10-29 DIAGNOSIS — Z87440 Personal history of urinary (tract) infections: Secondary | ICD-10-CM | POA: Diagnosis not present

## 2022-10-29 DIAGNOSIS — K8020 Calculus of gallbladder without cholecystitis without obstruction: Secondary | ICD-10-CM | POA: Diagnosis not present

## 2022-10-29 DIAGNOSIS — Z4803 Encounter for change or removal of drains: Secondary | ICD-10-CM | POA: Diagnosis not present

## 2022-10-29 DIAGNOSIS — Z950 Presence of cardiac pacemaker: Secondary | ICD-10-CM | POA: Diagnosis not present

## 2022-10-31 DIAGNOSIS — Z87440 Personal history of urinary (tract) infections: Secondary | ICD-10-CM | POA: Diagnosis not present

## 2022-10-31 DIAGNOSIS — Z950 Presence of cardiac pacemaker: Secondary | ICD-10-CM | POA: Diagnosis not present

## 2022-10-31 DIAGNOSIS — K8020 Calculus of gallbladder without cholecystitis without obstruction: Secondary | ICD-10-CM | POA: Diagnosis not present

## 2022-10-31 DIAGNOSIS — R64 Cachexia: Secondary | ICD-10-CM | POA: Diagnosis not present

## 2022-10-31 DIAGNOSIS — Z4803 Encounter for change or removal of drains: Secondary | ICD-10-CM | POA: Diagnosis not present

## 2022-10-31 DIAGNOSIS — E43 Unspecified severe protein-calorie malnutrition: Secondary | ICD-10-CM | POA: Diagnosis not present

## 2022-11-01 DIAGNOSIS — K8020 Calculus of gallbladder without cholecystitis without obstruction: Secondary | ICD-10-CM | POA: Diagnosis not present

## 2022-11-01 DIAGNOSIS — R64 Cachexia: Secondary | ICD-10-CM | POA: Diagnosis not present

## 2022-11-01 DIAGNOSIS — Z4803 Encounter for change or removal of drains: Secondary | ICD-10-CM | POA: Diagnosis not present

## 2022-11-01 DIAGNOSIS — E43 Unspecified severe protein-calorie malnutrition: Secondary | ICD-10-CM | POA: Diagnosis not present

## 2022-11-01 DIAGNOSIS — Z87440 Personal history of urinary (tract) infections: Secondary | ICD-10-CM | POA: Diagnosis not present

## 2022-11-01 DIAGNOSIS — Z950 Presence of cardiac pacemaker: Secondary | ICD-10-CM | POA: Diagnosis not present

## 2022-11-05 DIAGNOSIS — K8020 Calculus of gallbladder without cholecystitis without obstruction: Secondary | ICD-10-CM | POA: Diagnosis not present

## 2022-11-05 DIAGNOSIS — Z4803 Encounter for change or removal of drains: Secondary | ICD-10-CM | POA: Diagnosis not present

## 2022-11-05 DIAGNOSIS — E43 Unspecified severe protein-calorie malnutrition: Secondary | ICD-10-CM | POA: Diagnosis not present

## 2022-11-05 DIAGNOSIS — R64 Cachexia: Secondary | ICD-10-CM | POA: Diagnosis not present

## 2022-11-05 DIAGNOSIS — Z950 Presence of cardiac pacemaker: Secondary | ICD-10-CM | POA: Diagnosis not present

## 2022-11-05 DIAGNOSIS — Z87440 Personal history of urinary (tract) infections: Secondary | ICD-10-CM | POA: Diagnosis not present

## 2022-11-06 DIAGNOSIS — R64 Cachexia: Secondary | ICD-10-CM | POA: Diagnosis not present

## 2022-11-06 DIAGNOSIS — K8020 Calculus of gallbladder without cholecystitis without obstruction: Secondary | ICD-10-CM | POA: Diagnosis not present

## 2022-11-06 DIAGNOSIS — Z87440 Personal history of urinary (tract) infections: Secondary | ICD-10-CM | POA: Diagnosis not present

## 2022-11-06 DIAGNOSIS — E43 Unspecified severe protein-calorie malnutrition: Secondary | ICD-10-CM | POA: Diagnosis not present

## 2022-11-06 DIAGNOSIS — Z4803 Encounter for change or removal of drains: Secondary | ICD-10-CM | POA: Diagnosis not present

## 2022-11-06 DIAGNOSIS — Z950 Presence of cardiac pacemaker: Secondary | ICD-10-CM | POA: Diagnosis not present

## 2022-11-07 ENCOUNTER — Ambulatory Visit (INDEPENDENT_AMBULATORY_CARE_PROVIDER_SITE_OTHER): Payer: Medicare Other

## 2022-11-07 ENCOUNTER — Telehealth: Payer: Self-pay

## 2022-11-07 DIAGNOSIS — I441 Atrioventricular block, second degree: Secondary | ICD-10-CM | POA: Diagnosis not present

## 2022-11-07 DIAGNOSIS — E43 Unspecified severe protein-calorie malnutrition: Secondary | ICD-10-CM | POA: Diagnosis not present

## 2022-11-07 DIAGNOSIS — K8020 Calculus of gallbladder without cholecystitis without obstruction: Secondary | ICD-10-CM | POA: Diagnosis not present

## 2022-11-07 DIAGNOSIS — Z4803 Encounter for change or removal of drains: Secondary | ICD-10-CM | POA: Diagnosis not present

## 2022-11-07 DIAGNOSIS — Z950 Presence of cardiac pacemaker: Secondary | ICD-10-CM | POA: Diagnosis not present

## 2022-11-07 DIAGNOSIS — R64 Cachexia: Secondary | ICD-10-CM | POA: Diagnosis not present

## 2022-11-07 DIAGNOSIS — Z87440 Personal history of urinary (tract) infections: Secondary | ICD-10-CM | POA: Diagnosis not present

## 2022-11-07 LAB — CUP PACEART REMOTE DEVICE CHECK
Battery Remaining Longevity: 137 mo
Battery Voltage: 3.01 V
Brady Statistic AP VP Percent: 7.81 %
Brady Statistic AP VS Percent: 3.58 %
Brady Statistic AS VP Percent: 3.5 %
Brady Statistic AS VS Percent: 85.11 %
Brady Statistic RA Percent Paced: 16.12 %
Brady Statistic RV Percent Paced: 11.31 %
Date Time Interrogation Session: 20240821050935
Implantable Lead Connection Status: 753985
Implantable Lead Connection Status: 753985
Implantable Lead Implant Date: 20210823
Implantable Lead Implant Date: 20210823
Implantable Lead Location: 753859
Implantable Lead Location: 753860
Implantable Lead Model: 5076
Implantable Lead Model: 5076
Implantable Pulse Generator Implant Date: 20210823
Lead Channel Impedance Value: 380 Ohm
Lead Channel Impedance Value: 437 Ohm
Lead Channel Impedance Value: 456 Ohm
Lead Channel Impedance Value: 532 Ohm
Lead Channel Pacing Threshold Amplitude: 0.375 V
Lead Channel Pacing Threshold Amplitude: 0.5 V
Lead Channel Pacing Threshold Pulse Width: 0.4 ms
Lead Channel Pacing Threshold Pulse Width: 0.4 ms
Lead Channel Sensing Intrinsic Amplitude: 0.75 mV
Lead Channel Sensing Intrinsic Amplitude: 0.75 mV
Lead Channel Sensing Intrinsic Amplitude: 15 mV
Lead Channel Sensing Intrinsic Amplitude: 15 mV
Lead Channel Setting Pacing Amplitude: 2 V
Lead Channel Setting Pacing Amplitude: 2.5 V
Lead Channel Setting Pacing Pulse Width: 0.4 ms
Lead Channel Setting Sensing Sensitivity: 0.9 mV
Zone Setting Status: 755011
Zone Setting Status: 755011

## 2022-11-07 NOTE — Telephone Encounter (Signed)
Scheduled remote reviewed. ? atrial undersensing on presenting, consistent VS/safety pace - route to triage for review  Next remote 91 days. [place], CVRS    Spoke with patients daughter pt on hospice, not eating, not drinking patient is at end of life. Expressed condolences to patients daughter.

## 2022-11-08 DIAGNOSIS — Z950 Presence of cardiac pacemaker: Secondary | ICD-10-CM | POA: Diagnosis not present

## 2022-11-08 DIAGNOSIS — E43 Unspecified severe protein-calorie malnutrition: Secondary | ICD-10-CM | POA: Diagnosis not present

## 2022-11-08 DIAGNOSIS — Z87440 Personal history of urinary (tract) infections: Secondary | ICD-10-CM | POA: Diagnosis not present

## 2022-11-08 DIAGNOSIS — K8020 Calculus of gallbladder without cholecystitis without obstruction: Secondary | ICD-10-CM | POA: Diagnosis not present

## 2022-11-08 DIAGNOSIS — R64 Cachexia: Secondary | ICD-10-CM | POA: Diagnosis not present

## 2022-11-08 DIAGNOSIS — Z4803 Encounter for change or removal of drains: Secondary | ICD-10-CM | POA: Diagnosis not present

## 2022-11-09 DIAGNOSIS — E43 Unspecified severe protein-calorie malnutrition: Secondary | ICD-10-CM | POA: Diagnosis not present

## 2022-11-09 DIAGNOSIS — Z4803 Encounter for change or removal of drains: Secondary | ICD-10-CM | POA: Diagnosis not present

## 2022-11-09 DIAGNOSIS — R64 Cachexia: Secondary | ICD-10-CM | POA: Diagnosis not present

## 2022-11-09 DIAGNOSIS — Z950 Presence of cardiac pacemaker: Secondary | ICD-10-CM | POA: Diagnosis not present

## 2022-11-09 DIAGNOSIS — Z87440 Personal history of urinary (tract) infections: Secondary | ICD-10-CM | POA: Diagnosis not present

## 2022-11-09 DIAGNOSIS — K8020 Calculus of gallbladder without cholecystitis without obstruction: Secondary | ICD-10-CM | POA: Diagnosis not present

## 2022-11-11 DIAGNOSIS — Z950 Presence of cardiac pacemaker: Secondary | ICD-10-CM | POA: Diagnosis not present

## 2022-11-11 DIAGNOSIS — E43 Unspecified severe protein-calorie malnutrition: Secondary | ICD-10-CM | POA: Diagnosis not present

## 2022-11-11 DIAGNOSIS — K8020 Calculus of gallbladder without cholecystitis without obstruction: Secondary | ICD-10-CM | POA: Diagnosis not present

## 2022-11-11 DIAGNOSIS — Z87440 Personal history of urinary (tract) infections: Secondary | ICD-10-CM | POA: Diagnosis not present

## 2022-11-11 DIAGNOSIS — Z4803 Encounter for change or removal of drains: Secondary | ICD-10-CM | POA: Diagnosis not present

## 2022-11-11 DIAGNOSIS — R64 Cachexia: Secondary | ICD-10-CM | POA: Diagnosis not present

## 2022-11-18 DEATH — deceased

## 2022-11-21 NOTE — Progress Notes (Signed)
Remote pacemaker transmission.   

## 2023-10-07 ENCOUNTER — Other Ambulatory Visit (HOSPITAL_COMMUNITY): Payer: Self-pay | Admitting: Interventional Radiology

## 2023-10-07 DIAGNOSIS — K8020 Calculus of gallbladder without cholecystitis without obstruction: Secondary | ICD-10-CM
